# Patient Record
Sex: Male | Born: 1955 | Race: White | Hispanic: No | Marital: Married | Smoking: Former smoker
Health system: Southern US, Community
[De-identification: ages and names within clinical notes are randomized; demographics above are authoritative.]

## PROBLEM LIST (undated history)

## (undated) DIAGNOSIS — F32A Depression, unspecified: Secondary | ICD-10-CM

## (undated) DIAGNOSIS — I739 Peripheral vascular disease, unspecified: Secondary | ICD-10-CM

## (undated) DIAGNOSIS — E785 Hyperlipidemia, unspecified: Secondary | ICD-10-CM

## (undated) DIAGNOSIS — E669 Obesity, unspecified: Secondary | ICD-10-CM

## (undated) DIAGNOSIS — I10 Essential (primary) hypertension: Secondary | ICD-10-CM

## (undated) DIAGNOSIS — F329 Major depressive disorder, single episode, unspecified: Secondary | ICD-10-CM

## (undated) DIAGNOSIS — F17200 Nicotine dependence, unspecified, uncomplicated: Secondary | ICD-10-CM

## (undated) DIAGNOSIS — R5382 Chronic fatigue, unspecified: Secondary | ICD-10-CM

## (undated) DIAGNOSIS — F419 Anxiety disorder, unspecified: Secondary | ICD-10-CM

## (undated) DIAGNOSIS — E119 Type 2 diabetes mellitus without complications: Secondary | ICD-10-CM

## (undated) DIAGNOSIS — N529 Male erectile dysfunction, unspecified: Secondary | ICD-10-CM

## (undated) HISTORY — DX: Nicotine dependence, unspecified, uncomplicated: F17.200

## (undated) HISTORY — DX: Hyperlipidemia, unspecified: E78.5

## (undated) HISTORY — DX: Essential (primary) hypertension: I10

## (undated) HISTORY — DX: Obesity, unspecified: E66.9

## (undated) HISTORY — PX: CHOLECYSTECTOMY: SHX55

## (undated) HISTORY — DX: Chronic fatigue, unspecified: R53.82

## (undated) HISTORY — DX: Anxiety disorder, unspecified: F41.9

## (undated) HISTORY — PX: OTHER SURGICAL HISTORY: SHX169

## (undated) HISTORY — DX: Depression, unspecified: F32.A

## (undated) HISTORY — DX: Type 2 diabetes mellitus without complications: E11.9

## (undated) HISTORY — DX: Peripheral vascular disease, unspecified: I73.9

## (undated) HISTORY — DX: Male erectile dysfunction, unspecified: N52.9

## (undated) HISTORY — DX: Major depressive disorder, single episode, unspecified: F32.9

---

## 2001-10-23 ENCOUNTER — Encounter: Payer: Self-pay | Admitting: Family Medicine

## 2001-10-23 ENCOUNTER — Ambulatory Visit (HOSPITAL_COMMUNITY): Admission: RE | Admit: 2001-10-23 | Discharge: 2001-10-23 | Payer: Self-pay | Admitting: Family Medicine

## 2001-10-23 ENCOUNTER — Ambulatory Visit: Admission: RE | Admit: 2001-10-23 | Discharge: 2001-10-23 | Payer: Self-pay | Admitting: Family Medicine

## 2002-06-13 ENCOUNTER — Ambulatory Visit (HOSPITAL_COMMUNITY): Admission: RE | Admit: 2002-06-13 | Discharge: 2002-06-13 | Payer: Self-pay | Admitting: Family Medicine

## 2002-07-11 ENCOUNTER — Ambulatory Visit (HOSPITAL_COMMUNITY): Admission: RE | Admit: 2002-07-11 | Discharge: 2002-07-11 | Payer: Self-pay | Admitting: Family Medicine

## 2002-07-11 ENCOUNTER — Encounter: Payer: Self-pay | Admitting: Family Medicine

## 2002-08-01 ENCOUNTER — Observation Stay (HOSPITAL_COMMUNITY): Admission: RE | Admit: 2002-08-01 | Discharge: 2002-08-02 | Payer: Self-pay | Admitting: General Surgery

## 2002-09-24 ENCOUNTER — Ambulatory Visit (HOSPITAL_COMMUNITY): Admission: RE | Admit: 2002-09-24 | Discharge: 2002-09-24 | Payer: Self-pay | Admitting: General Surgery

## 2002-09-24 HISTORY — PX: ESOPHAGOGASTRODUODENOSCOPY: SHX1529

## 2002-10-01 ENCOUNTER — Ambulatory Visit (HOSPITAL_COMMUNITY): Admission: RE | Admit: 2002-10-01 | Discharge: 2002-10-01 | Payer: Self-pay | Admitting: General Surgery

## 2002-10-01 HISTORY — PX: COLONOSCOPY: SHX174

## 2002-10-01 LAB — HM COLONOSCOPY: HM Colonoscopy: NORMAL

## 2004-04-27 ENCOUNTER — Ambulatory Visit (HOSPITAL_COMMUNITY): Admission: RE | Admit: 2004-04-27 | Discharge: 2004-04-27 | Payer: Self-pay | Admitting: Family Medicine

## 2004-08-25 ENCOUNTER — Ambulatory Visit (HOSPITAL_COMMUNITY): Admission: RE | Admit: 2004-08-25 | Discharge: 2004-08-25 | Payer: Self-pay | Admitting: Orthopedic Surgery

## 2004-09-26 ENCOUNTER — Ambulatory Visit (HOSPITAL_COMMUNITY): Admission: RE | Admit: 2004-09-26 | Discharge: 2004-09-26 | Payer: Self-pay | Admitting: Family Medicine

## 2004-09-30 ENCOUNTER — Ambulatory Visit: Payer: Self-pay | Admitting: Internal Medicine

## 2004-10-06 ENCOUNTER — Inpatient Hospital Stay (HOSPITAL_BASED_OUTPATIENT_CLINIC_OR_DEPARTMENT_OTHER): Admission: RE | Admit: 2004-10-06 | Discharge: 2004-10-06 | Payer: Self-pay | Admitting: Cardiology

## 2004-10-06 ENCOUNTER — Ambulatory Visit: Payer: Self-pay | Admitting: Cardiology

## 2004-10-07 ENCOUNTER — Ambulatory Visit (HOSPITAL_COMMUNITY): Admission: RE | Admit: 2004-10-07 | Discharge: 2004-10-07 | Payer: Self-pay | Admitting: Cardiology

## 2004-11-02 ENCOUNTER — Ambulatory Visit: Payer: Self-pay

## 2004-11-02 ENCOUNTER — Ambulatory Visit: Payer: Self-pay | Admitting: Cardiovascular Disease

## 2004-11-21 ENCOUNTER — Ambulatory Visit: Payer: Self-pay | Admitting: Family Medicine

## 2004-12-22 ENCOUNTER — Ambulatory Visit: Payer: Self-pay | Admitting: Family Medicine

## 2005-01-23 ENCOUNTER — Ambulatory Visit: Payer: Self-pay | Admitting: Family Medicine

## 2005-02-20 ENCOUNTER — Ambulatory Visit: Payer: Self-pay | Admitting: Family Medicine

## 2005-03-02 ENCOUNTER — Ambulatory Visit: Payer: Self-pay | Admitting: Orthopedic Surgery

## 2005-04-25 ENCOUNTER — Ambulatory Visit: Payer: Self-pay | Admitting: Family Medicine

## 2005-04-27 ENCOUNTER — Ambulatory Visit (HOSPITAL_COMMUNITY): Admission: RE | Admit: 2005-04-27 | Discharge: 2005-04-27 | Payer: Self-pay | Admitting: Family Medicine

## 2005-06-22 ENCOUNTER — Ambulatory Visit: Payer: Self-pay | Admitting: Family Medicine

## 2005-08-23 ENCOUNTER — Ambulatory Visit: Payer: Self-pay | Admitting: Family Medicine

## 2005-09-21 ENCOUNTER — Ambulatory Visit: Payer: Self-pay | Admitting: Family Medicine

## 2005-11-27 ENCOUNTER — Ambulatory Visit: Payer: Self-pay | Admitting: Family Medicine

## 2005-12-07 ENCOUNTER — Ambulatory Visit: Payer: Self-pay | Admitting: Family Medicine

## 2006-01-08 ENCOUNTER — Ambulatory Visit: Payer: Self-pay | Admitting: Family Medicine

## 2006-03-07 ENCOUNTER — Ambulatory Visit: Payer: Self-pay | Admitting: Family Medicine

## 2006-04-17 ENCOUNTER — Ambulatory Visit: Payer: Self-pay | Admitting: Family Medicine

## 2006-05-24 ENCOUNTER — Ambulatory Visit: Payer: Self-pay | Admitting: Family Medicine

## 2006-05-29 ENCOUNTER — Ambulatory Visit (HOSPITAL_COMMUNITY): Admission: RE | Admit: 2006-05-29 | Discharge: 2006-05-29 | Payer: Self-pay | Admitting: Family Medicine

## 2006-07-05 ENCOUNTER — Ambulatory Visit: Payer: Self-pay | Admitting: Family Medicine

## 2006-08-16 ENCOUNTER — Ambulatory Visit: Payer: Self-pay | Admitting: Family Medicine

## 2006-10-15 ENCOUNTER — Ambulatory Visit: Payer: Self-pay | Admitting: Family Medicine

## 2006-11-22 ENCOUNTER — Ambulatory Visit: Payer: Self-pay | Admitting: Family Medicine

## 2007-01-23 ENCOUNTER — Ambulatory Visit: Payer: Self-pay | Admitting: Family Medicine

## 2007-01-24 ENCOUNTER — Encounter: Payer: Self-pay | Admitting: Family Medicine

## 2007-03-14 ENCOUNTER — Ambulatory Visit: Payer: Self-pay | Admitting: Family Medicine

## 2007-03-14 LAB — CONVERTED CEMR LAB
AST: 16 units/L (ref 0–37)
Albumin: 4.4 g/dL (ref 3.5–5.2)
Basophils Relative: 1 % (ref 0–1)
Bilirubin, Direct: 0.1 mg/dL (ref 0.0–0.3)
Calcium: 9.6 mg/dL (ref 8.4–10.5)
Chloride: 108 meq/L (ref 96–112)
Cholesterol: 215 mg/dL — ABNORMAL HIGH (ref 0–200)
Eosinophils Absolute: 0.2 10*3/uL (ref 0.0–0.7)
Eosinophils Relative: 2 % (ref 0–5)
Glucose, Bld: 108 mg/dL — ABNORMAL HIGH (ref 70–99)
HCT: 44.3 % (ref 39.0–52.0)
Hemoglobin: 14.7 g/dL (ref 13.0–17.0)
Indirect Bilirubin: 0.4 mg/dL (ref 0.0–0.9)
LDL Cholesterol: 152 mg/dL — ABNORMAL HIGH (ref 0–99)
Lymphs Abs: 2.6 10*3/uL (ref 0.7–3.3)
MCV: 96.5 fL (ref 78.0–100.0)
Platelets: 264 10*3/uL (ref 150–400)
Potassium: 4.9 meq/L (ref 3.5–5.3)
Total Bilirubin: 0.5 mg/dL (ref 0.3–1.2)
Total CHOL/HDL Ratio: 6.5
Triglycerides: 150 mg/dL — ABNORMAL HIGH (ref <150)

## 2007-04-04 ENCOUNTER — Ambulatory Visit (HOSPITAL_COMMUNITY): Admission: RE | Admit: 2007-04-04 | Discharge: 2007-04-04 | Payer: Self-pay | Admitting: Family Medicine

## 2007-04-25 ENCOUNTER — Ambulatory Visit (HOSPITAL_COMMUNITY): Admission: RE | Admit: 2007-04-25 | Discharge: 2007-04-25 | Payer: Self-pay | Admitting: Family Medicine

## 2007-04-25 ENCOUNTER — Ambulatory Visit: Payer: Self-pay | Admitting: Family Medicine

## 2007-05-17 ENCOUNTER — Ambulatory Visit: Payer: Self-pay | Admitting: Family Medicine

## 2007-06-26 ENCOUNTER — Ambulatory Visit: Payer: Self-pay | Admitting: Family Medicine

## 2007-06-26 LAB — CONVERTED CEMR LAB
Cholesterol: 260 mg/dL — ABNORMAL HIGH (ref 0–200)
Eosinophils Absolute: 0.2 10*3/uL (ref 0.0–0.7)
HCT: 45.3 % (ref 39.0–52.0)
Hgb A1c MFr Bld: 6 % (ref 4.6–6.1)
Lymphocytes Relative: 28 % (ref 12–46)
MCV: 97.6 fL (ref 78.0–100.0)
Monocytes Absolute: 0.5 10*3/uL (ref 0.2–0.7)
Neutro Abs: 5.6 10*3/uL (ref 1.7–7.7)
Neutrophils Relative %: 64 % (ref 43–77)
RDW: 13.3 % (ref 11.5–14.0)
Total CHOL/HDL Ratio: 7.2
WBC: 8.7 10*3/uL (ref 4.0–10.5)

## 2007-09-03 ENCOUNTER — Ambulatory Visit: Payer: Self-pay | Admitting: Family Medicine

## 2007-11-27 ENCOUNTER — Ambulatory Visit: Payer: Self-pay | Admitting: Family Medicine

## 2007-12-18 ENCOUNTER — Encounter: Payer: Self-pay | Admitting: Family Medicine

## 2007-12-18 LAB — CONVERTED CEMR LAB
ALT: 25 units/L (ref 0–53)
CO2: 27 meq/L (ref 19–32)
Calcium: 9.8 mg/dL (ref 8.4–10.5)
Glucose, Bld: 118 mg/dL — ABNORMAL HIGH (ref 70–99)
Potassium: 5 meq/L (ref 3.5–5.3)
Sodium: 142 meq/L (ref 135–145)
Total CHOL/HDL Ratio: 6
Triglycerides: 112 mg/dL (ref <150)

## 2007-12-19 ENCOUNTER — Encounter: Payer: Self-pay | Admitting: Family Medicine

## 2008-01-22 ENCOUNTER — Ambulatory Visit: Payer: Self-pay | Admitting: Family Medicine

## 2008-02-11 ENCOUNTER — Ambulatory Visit (HOSPITAL_COMMUNITY): Admission: RE | Admit: 2008-02-11 | Discharge: 2008-02-11 | Payer: Self-pay | Admitting: Family Medicine

## 2008-03-04 ENCOUNTER — Ambulatory Visit: Payer: Self-pay | Admitting: Family Medicine

## 2008-03-04 LAB — CONVERTED CEMR LAB: Microalb, Ur: 0.62 mg/dL (ref 0.00–1.89)

## 2008-03-18 HISTORY — PX: OTHER SURGICAL HISTORY: SHX169

## 2008-04-09 ENCOUNTER — Ambulatory Visit (HOSPITAL_COMMUNITY): Admission: RE | Admit: 2008-04-09 | Discharge: 2008-04-09 | Payer: Self-pay | Admitting: Orthopaedic Surgery

## 2008-05-22 DIAGNOSIS — R5381 Other malaise: Secondary | ICD-10-CM

## 2008-05-22 DIAGNOSIS — I10 Essential (primary) hypertension: Secondary | ICD-10-CM | POA: Insufficient documentation

## 2008-05-22 DIAGNOSIS — E1165 Type 2 diabetes mellitus with hyperglycemia: Secondary | ICD-10-CM

## 2008-05-22 DIAGNOSIS — F329 Major depressive disorder, single episode, unspecified: Secondary | ICD-10-CM | POA: Insufficient documentation

## 2008-05-22 DIAGNOSIS — E1159 Type 2 diabetes mellitus with other circulatory complications: Secondary | ICD-10-CM | POA: Insufficient documentation

## 2008-05-22 DIAGNOSIS — R5383 Other fatigue: Secondary | ICD-10-CM

## 2008-05-22 DIAGNOSIS — F411 Generalized anxiety disorder: Secondary | ICD-10-CM | POA: Insufficient documentation

## 2008-05-22 DIAGNOSIS — I739 Peripheral vascular disease, unspecified: Secondary | ICD-10-CM

## 2008-05-22 DIAGNOSIS — Z794 Long term (current) use of insulin: Secondary | ICD-10-CM

## 2008-05-22 DIAGNOSIS — E785 Hyperlipidemia, unspecified: Secondary | ICD-10-CM | POA: Insufficient documentation

## 2008-05-22 DIAGNOSIS — F17200 Nicotine dependence, unspecified, uncomplicated: Secondary | ICD-10-CM | POA: Insufficient documentation

## 2008-05-22 DIAGNOSIS — F528 Other sexual dysfunction not due to a substance or known physiological condition: Secondary | ICD-10-CM

## 2008-06-04 ENCOUNTER — Encounter: Payer: Self-pay | Admitting: Family Medicine

## 2008-07-02 ENCOUNTER — Ambulatory Visit: Payer: Self-pay | Admitting: Family Medicine

## 2008-07-28 ENCOUNTER — Telehealth: Payer: Self-pay | Admitting: Family Medicine

## 2008-07-29 ENCOUNTER — Encounter: Payer: Self-pay | Admitting: Family Medicine

## 2008-08-25 ENCOUNTER — Encounter: Payer: Self-pay | Admitting: Family Medicine

## 2008-08-25 ENCOUNTER — Telehealth: Payer: Self-pay | Admitting: Family Medicine

## 2008-09-01 HISTORY — PX: ROTATOR CUFF REPAIR: SHX139

## 2008-09-04 ENCOUNTER — Encounter: Payer: Self-pay | Admitting: Family Medicine

## 2008-09-16 ENCOUNTER — Ambulatory Visit: Payer: Self-pay | Admitting: Family Medicine

## 2008-09-16 DIAGNOSIS — M25519 Pain in unspecified shoulder: Secondary | ICD-10-CM

## 2008-09-24 ENCOUNTER — Encounter: Payer: Self-pay | Admitting: Family Medicine

## 2008-10-20 ENCOUNTER — Encounter: Payer: Self-pay | Admitting: Family Medicine

## 2008-10-20 ENCOUNTER — Telehealth: Payer: Self-pay | Admitting: Family Medicine

## 2008-10-21 ENCOUNTER — Encounter: Payer: Self-pay | Admitting: Family Medicine

## 2008-10-27 ENCOUNTER — Encounter: Payer: Self-pay | Admitting: Family Medicine

## 2008-11-26 ENCOUNTER — Telehealth: Payer: Self-pay | Admitting: Family Medicine

## 2008-11-30 ENCOUNTER — Encounter: Payer: Self-pay | Admitting: Family Medicine

## 2008-12-25 ENCOUNTER — Ambulatory Visit: Payer: Self-pay | Admitting: Family Medicine

## 2008-12-25 DIAGNOSIS — R413 Other amnesia: Secondary | ICD-10-CM | POA: Insufficient documentation

## 2008-12-25 DIAGNOSIS — M5432 Sciatica, left side: Secondary | ICD-10-CM

## 2008-12-25 LAB — CONVERTED CEMR LAB
Glucose, Bld: 126 mg/dL
Hgb A1c MFr Bld: 6.3 %

## 2009-01-01 ENCOUNTER — Encounter: Payer: Self-pay | Admitting: Family Medicine

## 2009-01-13 ENCOUNTER — Telehealth: Payer: Self-pay | Admitting: Family Medicine

## 2009-02-01 ENCOUNTER — Encounter: Payer: Self-pay | Admitting: Family Medicine

## 2009-03-01 ENCOUNTER — Telehealth: Payer: Self-pay | Admitting: Family Medicine

## 2009-03-01 ENCOUNTER — Encounter: Payer: Self-pay | Admitting: Family Medicine

## 2009-03-02 ENCOUNTER — Encounter: Payer: Self-pay | Admitting: Family Medicine

## 2009-03-26 ENCOUNTER — Encounter: Payer: Self-pay | Admitting: Family Medicine

## 2009-04-05 ENCOUNTER — Telehealth: Payer: Self-pay | Admitting: Family Medicine

## 2009-04-05 ENCOUNTER — Encounter: Payer: Self-pay | Admitting: Family Medicine

## 2009-05-04 ENCOUNTER — Telehealth: Payer: Self-pay | Admitting: Family Medicine

## 2009-05-06 ENCOUNTER — Encounter: Payer: Self-pay | Admitting: Family Medicine

## 2009-05-24 ENCOUNTER — Ambulatory Visit: Payer: Self-pay | Admitting: Family Medicine

## 2009-05-24 DIAGNOSIS — M79609 Pain in unspecified limb: Secondary | ICD-10-CM

## 2009-05-24 LAB — CONVERTED CEMR LAB
Glucose, Bld: 187 mg/dL
Hgb A1c MFr Bld: 6.2 %

## 2009-06-01 ENCOUNTER — Encounter: Payer: Self-pay | Admitting: Family Medicine

## 2009-06-07 ENCOUNTER — Telehealth: Payer: Self-pay | Admitting: Family Medicine

## 2009-06-30 ENCOUNTER — Telehealth: Payer: Self-pay | Admitting: Family Medicine

## 2009-07-01 ENCOUNTER — Encounter: Payer: Self-pay | Admitting: Family Medicine

## 2009-07-14 ENCOUNTER — Ambulatory Visit: Payer: Self-pay | Admitting: Family Medicine

## 2009-07-14 DIAGNOSIS — E669 Obesity, unspecified: Secondary | ICD-10-CM | POA: Insufficient documentation

## 2009-07-14 LAB — CONVERTED CEMR LAB: Glucose, Bld: 109 mg/dL

## 2009-07-15 ENCOUNTER — Encounter: Payer: Self-pay | Admitting: Family Medicine

## 2009-07-15 ENCOUNTER — Telehealth: Payer: Self-pay | Admitting: Family Medicine

## 2009-07-15 LAB — CONVERTED CEMR LAB: Microalb Creat Ratio: 7.6 mg/g (ref 0.0–30.0)

## 2009-07-16 ENCOUNTER — Telehealth: Payer: Self-pay | Admitting: Family Medicine

## 2009-07-19 ENCOUNTER — Encounter: Payer: Self-pay | Admitting: Family Medicine

## 2009-07-21 ENCOUNTER — Encounter: Payer: Self-pay | Admitting: Family Medicine

## 2009-07-23 ENCOUNTER — Encounter: Payer: Self-pay | Admitting: Family Medicine

## 2009-07-23 LAB — CONVERTED CEMR LAB
ALT: 25 units/L (ref 0–53)
Albumin: 4.7 g/dL (ref 3.5–5.2)
Basophils Absolute: 0 10*3/uL (ref 0.0–0.1)
Basophils Relative: 0 % (ref 0–1)
Calcium: 9.9 mg/dL (ref 8.4–10.5)
Chloride: 103 meq/L (ref 96–112)
Eosinophils Absolute: 0.2 10*3/uL (ref 0.0–0.7)
Eosinophils Relative: 2 % (ref 0–5)
HCT: 46.3 % (ref 39.0–52.0)
HDL: 39 mg/dL — ABNORMAL LOW (ref >39)
Hemoglobin: 15.5 g/dL (ref 13.0–17.0)
MCV: 93.9 fL (ref 78.0–100.0)
Monocytes Relative: 9 % (ref 3–12)
Neutrophils Relative %: 61 % (ref 43–77)
Platelets: 267 10*3/uL (ref 150–400)
RDW: 13.3 % (ref 11.5–15.5)
Sodium: 137 meq/L (ref 135–145)
TSH: 2.475 microintl units/mL (ref 0.350–4.500)
Total Protein: 7.3 g/dL (ref 6.0–8.3)
Uric Acid, Serum: 6.5 mg/dL (ref 4.0–7.8)

## 2009-07-28 ENCOUNTER — Encounter: Payer: Self-pay | Admitting: Family Medicine

## 2009-10-29 ENCOUNTER — Ambulatory Visit: Payer: Self-pay | Admitting: Family Medicine

## 2009-10-29 LAB — CONVERTED CEMR LAB: Glucose, Bld: 187 mg/dL

## 2010-01-13 ENCOUNTER — Ambulatory Visit: Payer: Self-pay | Admitting: Family Medicine

## 2010-01-13 DIAGNOSIS — J209 Acute bronchitis, unspecified: Secondary | ICD-10-CM

## 2010-02-15 ENCOUNTER — Telehealth: Payer: Self-pay | Admitting: Family Medicine

## 2010-04-07 ENCOUNTER — Encounter: Payer: Self-pay | Admitting: Family Medicine

## 2010-04-14 ENCOUNTER — Telehealth: Payer: Self-pay | Admitting: Family Medicine

## 2010-04-21 ENCOUNTER — Telehealth: Payer: Self-pay | Admitting: Family Medicine

## 2010-06-21 ENCOUNTER — Ambulatory Visit: Payer: Self-pay | Admitting: Family Medicine

## 2010-06-23 LAB — CONVERTED CEMR LAB
ALT: 24 units/L (ref 0–53)
Albumin: 4.5 g/dL (ref 3.5–5.2)
Alkaline Phosphatase: 59 units/L (ref 39–117)
BUN: 10 mg/dL (ref 6–23)
CO2: 25 meq/L (ref 19–32)
Calcium: 10 mg/dL (ref 8.4–10.5)
Chloride: 104 meq/L (ref 96–112)
Cholesterol: 263 mg/dL — ABNORMAL HIGH (ref 0–200)
Glucose, Bld: 82 mg/dL (ref 70–99)
HDL: 40 mg/dL (ref >39)
Indirect Bilirubin: 0.3 mg/dL (ref 0.0–0.9)
Sodium: 140 meq/L (ref 135–145)
Total CHOL/HDL Ratio: 6.6
Total Protein: 7 g/dL (ref 6.0–8.3)
Triglycerides: 281 mg/dL — ABNORMAL HIGH (ref <150)
VLDL: 56 mg/dL — ABNORMAL HIGH (ref 0–40)

## 2010-06-24 ENCOUNTER — Telehealth: Payer: Self-pay | Admitting: Family Medicine

## 2010-08-02 ENCOUNTER — Telehealth: Payer: Self-pay | Admitting: Family Medicine

## 2010-09-21 ENCOUNTER — Ambulatory Visit: Payer: Self-pay | Admitting: Family Medicine

## 2010-09-21 DIAGNOSIS — H547 Unspecified visual loss: Secondary | ICD-10-CM

## 2010-09-22 LAB — CONVERTED CEMR LAB
AST: 15 units/L (ref 0–37)
Alkaline Phosphatase: 56 units/L (ref 39–117)
Basophils Absolute: 0.1 10*3/uL (ref 0.0–0.1)
Basophils Relative: 1 % (ref 0–1)
CO2: 26 meq/L (ref 19–32)
Chloride: 104 meq/L (ref 96–112)
Creatinine, Ser: 0.8 mg/dL (ref 0.40–1.50)
Glucose, Bld: 83 mg/dL (ref 70–99)
HCT: 42.6 % (ref 39.0–52.0)
Hemoglobin: 14.6 g/dL (ref 13.0–17.0)
Hgb A1c MFr Bld: 6.2 % — ABNORMAL HIGH (ref <5.7)
MCHC: 34.3 g/dL (ref 30.0–36.0)
Monocytes Absolute: 0.8 10*3/uL (ref 0.1–1.0)
Monocytes Relative: 8 % (ref 3–12)
Neutro Abs: 4.5 10*3/uL (ref 1.7–7.7)
PSA: 1.79 ng/mL (ref 0.10–4.00)
Platelets: 248 10*3/uL (ref 150–400)
RDW: 12.8 % (ref 11.5–15.5)
Total Protein: 7 g/dL (ref 6.0–8.3)
Vit D, 25-Hydroxy: 47 ng/mL (ref 30–89)
WBC: 9.1 10*3/uL (ref 4.0–10.5)

## 2010-09-25 DIAGNOSIS — J019 Acute sinusitis, unspecified: Secondary | ICD-10-CM | POA: Insufficient documentation

## 2010-12-21 ENCOUNTER — Telehealth: Payer: Self-pay | Admitting: Family Medicine

## 2011-01-05 ENCOUNTER — Ambulatory Visit: Admit: 2011-01-05 | Payer: Self-pay | Admitting: Family Medicine

## 2011-01-05 ENCOUNTER — Encounter: Payer: Self-pay | Admitting: Family Medicine

## 2011-01-08 ENCOUNTER — Encounter: Payer: Self-pay | Admitting: Orthopedic Surgery

## 2011-01-08 ENCOUNTER — Encounter: Payer: Self-pay | Admitting: Family Medicine

## 2011-01-17 NOTE — Letter (Signed)
Summary: misc  misc   Imported By: Curtis Sites 05/23/2010 13:20:45  _____________________________________________________________________  External Attachment:    Type:   Image     Comment:   External Document

## 2011-01-17 NOTE — Letter (Signed)
Summary: physical and history  physical and history   Imported By: Curtis Sites 05/23/2010 13:10:06  _____________________________________________________________________  External Attachment:    Type:   Image     Comment:   External Document

## 2011-01-17 NOTE — Assessment & Plan Note (Signed)
Summary: OV   Vital Signs:  Patient profile:   55 year old male Height:      69 inches Weight:      200 pounds BMI:     29.64 O2 Sat:      99 % on Room air Temp:     99.2 degrees F oral Pulse rate:   76 / minute Pulse rhythm:   regular Resp:     16 per minute BP sitting:   150 / 78 Cuff size:   large  Vitals Entered By: Everitt Amber (January 13, 2010 1:49 PM)  Nutrition Counseling: Patient's BMI is greater than 25 and therefore counseled on weight management options.  O2 Flow:  Room air CC: Headache, nausea, upset stomach, coughing with green phlegm, really weak, chest is sore, chills, fever and body aches   CC:  Headache, nausea, upset stomach, coughing with green phlegm, really weak, chest is sore, chills, and fever and body aches.  History of Present Illness: Pt c/o 1 week h/o increeasing weakness, with chills, fatigue, headache, cough  sputum , fever and chills.  Preventive Screening-Counseling & Management  Alcohol-Tobacco     Smoking Cessation Counseling: yes  Allergies: 1)  ! Septra 2)  ! Levaquin  Review of Systems      See HPI General:  Complains of chills, fatigue, fever, loss of appetite, malaise, sweats, and weakness. Eyes:  Denies discharge and red eye. ENT:  Complains of earache, postnasal drainage, sinus pressure, and sore throat. CV:  Denies chest pain or discomfort, palpitations, and swelling of feet. Resp:  Complains of cough, shortness of breath, sputum productive, and wheezing; 1 week h/o progressive symptoms. GI:  Complains of diarrhea and nausea. GU:  Denies dysuria and urinary frequency. MS:  Complains of joint pain, low back pain, and mid back pain. Endo:  Denies cold intolerance, excessive hunger, excessive thirst, excessive urination, heat intolerance, polyuria, and weight change; tests infrequentlly. Heme:  Denies abnormal bruising and bleeding. Allergy:  Complains of seasonal allergies; denies hives or rash and sneezing.  Physical  Exam  General:  Well-developed,well-nourished,in no acute distress; alert,appropriate and cooperative throughout examination. Ill appearing HEENT: No facial asymmetry,  EOMI,positiveo sinus tenderness, TM's Clear, oropharynx  pink and moist.   Chest: bilateral crackles and wheezes withreduced air entry throughout  CVS: S1, S2, No murmurs, No S3.   Abd: Soft, Nontender.  MS: decreased  ROM spine, hips, shoulders and knees.  Ext: No edema.   CNS: CN 2-12 intact, power tone and sensation normal throughout.   Skin: Intact, no visible lesions or rashes.  Psych: Good eye contact, normal affect.  Memory intact, not anxious or depressed appearing.    Impression & Recommendations:  Problem # 1:  OTHER ACUTE SINUSITIS (ICD-461.8) Assessment Comment Only  His updated medication list for this problem includes:    Penicillin V Potassium 500 Mg Tabs (Penicillin v potassium) .Marland Kitchen... Take 1 tablet by mouth three times a day    Tessalon Perles 100 Mg Caps (Benzonatate) .Marland Kitchen... Take 1 capsule by mouth three times a day  Orders: Admin of Therapeutic Inj  intramuscular or subcutaneous (54098)  Problem # 2:  ACUTE BRONCHITIS (ICD-466.0) Assessment: Comment Only  His updated medication list for this problem includes:    Penicillin V Potassium 500 Mg Tabs (Penicillin v potassium) .Marland Kitchen... Take 1 tablet by mouth three times a day    Tessalon Perles 100 Mg Caps (Benzonatate) .Marland Kitchen... Take 1 capsule by mouth three times a day  Orders:  Rocephin  250mg  (Z6109) Albuterol Sulfate Sol 1mg  unit dose (U0454) Atrovent 1mg  (Neb) (U9811) Nebulizer Tx (91478)  Problem # 3:  DEPRESSION (ICD-311) Assessment: Improved  His updated medication list for this problem includes:    Diazepam 10 Mg Tabs (Diazepam) ..... One to two tabs by mouth two times a day prn    Cymbalta 60 Mg Cpep (Duloxetine hcl) ..... One cap by mouth qd  Problem # 4:  NICOTINE ADDICTION (ICD-305.1) Assessment: Unchanged  Encouraged smoking  cessation and discussed different methods for smoking cessation.   Problem # 5:  DIABETES MELLITUS, TYPE II (ICD-250.00) Assessment: Comment Only  His updated medication list for this problem includes:    Metformin Hcl 500 Mg Tabs (Metformin hcl) ..... One tab by mouth bid    Lotensin Hct 20-25 Mg Tabs (Benazepril-hydrochlorothiazide) .Marland Kitchen... Take 1 tablet by mouth once a day  Labs Reviewed: Creat: 0.99 (07/21/2009)    Reviewed HgBA1c results: 6.2 (10/29/2009)  6.2 (05/24/2009)  Problem # 6:  HYPERTENSION (ICD-401.9) Assessment: Deteriorated  His updated medication list for this problem includes:    Lotensin Hct 20-25 Mg Tabs (Benazepril-hydrochlorothiazide) .Marland Kitchen... Take 1 tablet by mouth once a day    Norvasc 5 Mg Tabs (Amlodipine besylate) .Marland Kitchen... Take 1 tablet by mouth once a day  Orders: T-Basic Metabolic Panel ([id number])  BP today: 150/78 Prior BP: 140/76 (10/29/2009)  Labs Reviewed: K+: 4.7 (07/21/2009) Creat: : 0.99 (07/21/2009)   Chol: 215 (07/21/2009)   HDL: 39 (07/21/2009)   LDL: 141 (07/21/2009)   TG: 175 (07/21/2009)  Problem # 7:  HYPERLIPIDEMIA (ICD-272.4) Assessment: Comment Only  His updated medication list for this problem includes:    Crestor 20 Mg Tabs (Rosuvastatin calcium) .Marland Kitchen... ,take 1 tab by mouth at bedtime  Orders: T-Hepatic Function [phone]) T-Lipid Profile [phone])  Labs Reviewed: SGOT: 13 (07/21/2009)   SGPT: 25 (07/21/2009)   HDL:39 (07/21/2009), 44 (12/18/2007)  LDL:141 (07/21/2009), 198 (12/18/2007)  Chol:215 (07/21/2009), 264 (12/18/2007)  Trig:175 (07/21/2009), 112 (12/18/2007)  Problem # 8:  INFLUENZA (ICD-487.8) Assessment: Comment Only tamiflu prescribed  Problem # 9:  BACK PAIN WITH RADICULOPATHY (ICD-729.2) Assessment: Deteriorated  Orders: Depo- Medrol 80mg  (J1040) Ketorolac-Toradol 15mg  (U2725)  Complete Medication List: 1)  Adderall 10 Mg Tabs (Amphetamine-dextroamphetamine) .... One tab by mouth two times a  day 2)  Levitra 20 Mg Tabs (Vardenafil hcl) .... One tab by mouth once daily as needed 3)  Lotrisone 1-0.05 % Lotn (Clotrimazole-betamethasone) .... Uad 4)  Metformin Hcl 500 Mg Tabs (Metformin hcl) .... One tab by mouth bid 5)  Crestor 20 Mg Tabs (Rosuvastatin calcium) .... ,take 1 tab by mouth at bedtime 6)  Penicillin V Potassium 500 Mg Tabs (Penicillin v potassium) .... Take 1 tablet by mouth three times a day 7)  Tessalon Perles 100 Mg Caps (Benzonatate) .... Take 1 capsule by mouth three times a day 8)  Lotensin Hct 20-25 Mg Tabs (Benazepril-hydrochlorothiazide) .... Take 1 tablet by mouth once a day 9)  Fentanyl 75 Mcg/hr Pt72 (Fentanyl) .... One patch every two days 10)  Promethazine Hcl 25 Mg Tabs (Promethazine hcl) .... One to two tabs every 6 hours prn 11)  Diazepam 10 Mg Tabs (Diazepam) .... One to two tabs by mouth two times a day prn 12)  Cymbalta 60 Mg Cpep (Duloxetine hcl) .... One cap by mouth qd 13)  Nabumetone 750 Mg Tabs (Nabumetone) .... One tab by mouth bid 14)  Proctocream-hc 2.5 % Crea (Hydrocortisone) .... Apply two times a day prn 15)  Norvasc 5 Mg Tabs (Amlodipine besylate) .... Take 1 tablet by mouth once a day 16)  Tamiflu 75 Mg Caps (Oseltamivir phosphate) .... Take 1 capsule by mouth two times a day 17)  Penicillin V Potassium 500 Mg Tabs (Penicillin v potassium) .... Take 1 tablet by mouth three times a day 18)  Tessalon Perles 100 Mg Caps (Benzonatate) .... Take 1 capsule by mouth three times a day 19)  Apothecary Cough Syrup  .... 5 cc by mouth every 6 hrs as needed  Patient Instructions: 1)  F/U in  2 months. 2)  You are being treated for influenza, sinusitis, and acute bronchitis. 3)  Tobacco is very bad for your health and your loved ones! You Should stop smoking!. 4)  Stop Smoking Tips: Choose a Quit date. Cut down before the Quit date. decide what you will do as a substitute when you feel the urge to smoke(gum,toothpick,exercise). 5)  It is important  that you exercise regularly at least 20 minutes 5 times a week. If you develop chest pain, have severe difficulty breathing, or feel very tired , stop exercising immediately and seek medical attention. 6)  You need to lose weight. Consider a lower calorie diet and regular exercise.  7)  BMP prior to visit, ICD-9: 8)  Hepatic Panel prior to visit, ICD-9:   fasting in 2 months 9)  Lipid Panel prior to visit, ICD-9: Prescriptions: APOTHECARY COUGH SYRUP 5 cc by mouth every 6 hrs as needed  #4oz x 1   Entered and Authorized by:   Syliva Overman MD   Signed by:   Syliva Overman MD on 01/24/2010   Method used:   Handwritten   RxID:   [id number] TESSALON PERLES 100 MG CAPS (BENZONATATE) Take 1 capsule by mouth three times a day  #30 x 0   Entered and Authorized by:   Syliva Overman MD   Signed by:   Syliva Overman MD on 01/13/2010   Method used:   Electronically to        [facility]* (retail)       [address]       [place], [place]  [address]       Ph: [phone]       Fax: [phone]   RxID:   [id number] PENICILLIN V POTASSIUM 500 MG TABS (PENICILLIN V POTASSIUM) Take 1 tablet by mouth three times a day  #30 x 0   Entered and Authorized by:   Syliva Overman MD   Signed by:   Syliva Overman MD on 01/13/2010   Method used:   Electronically to        [facility]* (retail)       [address]       [place], [place]  [address]       Ph: [phone]       Fax: [phone]   RxID:   [id number] TAMIFLU 75 MG CAPS (OSELTAMIVIR PHOSPHATE) Take 1 capsule by mouth two times a day  #10 x 0   Entered and Authorized by:   Syliva Overman MD   Signed by:   Syliva Overman MD on 01/13/2010   Method used:   Electronically to        [facility]* (retail)       [address]       [place], [place]  [address]       Ph: [phone]       Fax: [phone]   RxID:   [id number]    Medication  Administration  Injection # 1:    Medication: Depo- Medrol 80mg     Diagnosis: BACK PAIN WITH RADICULOPATHY (ICD-729.2)    Route: IM    Site: RUOQ gluteus    Exp Date: 06/2011    Lot #: [id number]    Comments: 80 mg given     Patient tolerated injection without complications    Given by: Everitt Amber (January 13, 2010 3:12 PM)  Injection # 2:    Medication: Ketorolac-Toradol 15mg     Diagnosis: BACK PAIN WITH RADICULOPATHY (ICD-729.2)    Route: IM    Site: LUOQ gluteus    Exp Date: 07/2011    Lot #: [id number]    Mfr: novaplus    Comments: 60 mg given     Patient tolerated injection without complications    Given by: Everitt Amber (January 13, 2010 3:13 PM)  Injection # 3:    Medication: Rocephin  250mg     Diagnosis: ACUTE BRONCHITIS (ICD-466.0)    Route: IM    Site: RUOQ gluteus    Exp Date: 03/2012    Lot #: [id number]    Mfr: sandoz    Comments: 500 mg given     Patient tolerated injection without complications    Given by: Everitt Amber (January 13, 2010 3:14 PM)  Medication # 1:    Medication: Albuterol Sulfate Sol 1mg  unit dose    Diagnosis: ACUTE BRONCHITIS (ICD-466.0)    Dose: 2.5/73ml    Route: inhaled    Exp Date: 8/11    Lot #: [id number]    Mfr: nephron    Patient tolerated medication without complications    Given by: Everitt Amber (January 13, 2010 3:21 PM)  Medication # 2:    Medication: Atrovent 1mg  (Neb)    Diagnosis: ACUTE BRONCHITIS (ICD-466.0)    Dose: 0.5/2.33ml    Route: intranasal    Exp Date: 6/11    Lot #: [id number]    Mfr: nephron    Patient tolerated medication without complications    Given by: Everitt Amber (January 13, 2010 3:22 PM)  Orders Added: 1)  Est. Patient Level IV [99214] 2)  T-Basic Metabolic Panel [80048-22910] 3)  T-Hepatic Function [80076-22960] 4)  T-Lipid Profile [80061-22930] 5)  Depo- Medrol 80mg  [J1040] 6)  Ketorolac-Toradol 15mg  [J1885] 7)  Rocephin  250mg  [J0696] 8)  Admin of Therapeutic Inj  intramuscular or subcutaneous [96372] 9)   Albuterol Sulfate Sol 1mg  unit dose [J7613] 10)  Atrovent 1mg  (Neb) [Q0347] 11)  Nebulizer Tx [42595]

## 2011-01-17 NOTE — Progress Notes (Signed)
Summary: shot  Phone Note Call from Patient   Summary of Call: pt wants to come in today for just a shot.   [phone] Initial call taken by: Rudene Anda,  August 02, 2010 9:40 AM  Follow-up for Phone Call        wife states patient wants injection advised we do not do joint injections but if he is only wanting pain shot to have patient call and schedule ov Follow-up by: Adella Hare LPN,  August 02, 2010 9:44 AM

## 2011-01-17 NOTE — Letter (Signed)
Summary: progress notes  progress notes   Imported By: Curtis Sites 05/23/2010 13:09:37  _____________________________________________________________________  External Attachment:    Type:   Image     Comment:   External Document

## 2011-01-17 NOTE — Progress Notes (Signed)
Summary: KIDNEY STONES  Phone Note Call from Patient   Summary of Call: WENT TO ED FOR KIDNEY STONE AND THEY SAID THAT HE NEEDED A REFERRASLL TO A NEU CALL BACK AT [phone] Initial call taken by: Lind Guest,  April 14, 2010 1:51 PM  Follow-up for Phone Call        pls ask which ED he went to and send for front sheet verifying the dianosis, ,leave on my chair  Follow-up by: Syliva Overman MD,  April 15, 2010 6:58 AM  Additional Follow-up for Phone Call Additional follow up Details #1::        sent for summary Additional Follow-up by: Everitt Amber LPN,  April 15, 2010 8:09 AM

## 2011-01-17 NOTE — Progress Notes (Signed)
Summary: call  Phone Note Call from Patient   Summary of Call: call him at [phone] asap shelia said Initial call taken by: Lind Guest,  February 15, 2010 1:31 PM  Follow-up for Phone Call        Mark Walls has been suffering with congestion in his nose and throat.  Rarely it has yellowish color to it but most of the time its clear. It stays in his sinuses and drains down his throat all the time. Has had it for 4 months and can't get rid of it. States he has taken antibiotics and it didn't help. what does he need to do to get rid of it because its driving him crazy and making his throat raw  [facility]  Follow-up by: Everitt Amber LPN,  February 15, 2010 1:43 PM  Additional Follow-up for Phone Call Additional follow up Details #1::        i sent a prescription for Flonase nasal spray to the pharmacy for him.  Tell him to give it 2 weeks.  If his congestion is not improving after 2 wks then call back. Additional Follow-up by: Esperanza Sheets PA,  February 15, 2010 2:27 PM    Additional Follow-up for Phone Call Additional follow up Details #2::    Patient aware. Will call back in 2 weeks if no better Follow-up by: Everitt Amber LPN,  February 15, 2010 2:45 PM  New/Updated Medications: FLUTICASONE PROPIONATE 50 MCG/ACT SUSP (FLUTICASONE PROPIONATE) 2 sprays each nostril once daily Prescriptions: FLUTICASONE PROPIONATE 50 MCG/ACT SUSP (FLUTICASONE PROPIONATE) 2 sprays each nostril once daily  #1 x 1   Entered and Authorized by:   Esperanza Sheets PA   Signed by:   Esperanza Sheets PA on 02/15/2010   Method used:   Electronically to        [facility]* (retail)       [address]       [place], [place]  [address]       Ph: [phone]       Fax: [phone]   RxID:   [id number]

## 2011-01-17 NOTE — Letter (Signed)
Summary: consults  consults   Imported By: Curtis Sites 05/23/2010 13:14:12  _____________________________________________________________________  External Attachment:    Type:   Image     Comment:   External Document

## 2011-01-17 NOTE — Letter (Signed)
Summary: lab  lab   Imported By: Curtis Sites 05/23/2010 13:10:52  _____________________________________________________________________  External Attachment:    Type:   Image     Comment:   External Document

## 2011-01-17 NOTE — Progress Notes (Signed)
  Phone Note From Pharmacy   Caller: [facility]* Summary of Call: crestor requires pa Initial call taken by: Adella Hare LPN,  June 24, 453 4:09 PM  Follow-up for Phone Call        send in for pravastatin 40mg  2 at bedtime #60 refdill 4 pls , and fax with d/c order on crestor also stamp and let pt know Follow-up by: Syliva Overman MD,  June 26, 2010 5:17 AM  Additional Follow-up for Phone Call Additional follow up Details #1::        med sent, called patient, left message Additional Follow-up by: Adella Hare LPN,  June 27, 2010 11:44 AM    New/Updated Medications: PRAVASTATIN SODIUM 40 MG TABS (PRAVASTATIN SODIUM) 2 at bedtime PRAVASTATIN SODIUM 40 MG TABS (PRAVASTATIN SODIUM) 2 at bedtime discontinue crestor Prescriptions: PRAVASTATIN SODIUM 40 MG TABS (PRAVASTATIN SODIUM) 2 at bedtime discontinue crestor  #60 x 4   Entered by:   Adella Hare LPN   Authorized by:   Syliva Overman MD   Signed by:   Adella Hare LPN on 09/81/1914   Method used:   Electronically to        [facility]* (retail)       [address]       [place], [place]  [address]       Ph: [phone]       Fax: [phone]   RxID:   [id number] PRAVASTATIN SODIUM 40 MG TABS (PRAVASTATIN SODIUM) 2 at bedtime  #60 x 4   Entered and Authorized by:   Syliva Overman MD   Signed by:   Syliva Overman MD on 06/26/2010   Method used:   Historical   RxID:   [id number]

## 2011-01-17 NOTE — Letter (Signed)
Summary: demographic  demographic   Imported By: Curtis Sites 05/23/2010 13:10:29  _____________________________________________________________________  External Attachment:    Type:   Image     Comment:   External Document

## 2011-01-17 NOTE — Assessment & Plan Note (Signed)
Summary: OV   Vital Signs:  Patient profile:   55 year old male Height:      69 inches Weight:      197.25 pounds BMI:     29.23 O2 Sat:      98 % on Room air Pulse rate:   74 / minute Pulse rhythm:   regular Resp:     16 per minute BP sitting:   154 / 86  (left arm)  Vitals Entered By: Adella Hare LPN (June 21, 453 1:38 PM)  O2 Flow:  Room air CC: cough and pain in multiple sites Is Patient Diabetic? Yes Did you bring your meter with you today? No Comments complains of pain in multiple sites patient did not bring meds to ov   CC:  cough and pain in multiple sites.  History of Present Illness: Pt comews in stating that in the past 3 weeks he has had uncontrolled cough productive of yellow sputum, he denies nasl congestion, sinus pressure, fever or chills, he c/o uncontrolled baack pain which has inxceased in the last week with no associated aggravating factor. He rreports loss of vision bilaterally, right greater than left and requests referral for an eye exam He still smokes , with no quit date in mind. He reports good response to his antidepressants but reports continued stress.  Preventive Screening-Counseling & Management  Alcohol-Tobacco     Smoking Cessation Counseling: yes  Allergies: 1)  ! Septra 2)  ! Levaquin 3)  ! Allopurinol  Review of Systems      See HPI General:  Complains of chills, fatigue, fever, and weakness. Eyes:  Complains of vision loss-1 eye; markedf vision loss in right eye, . ENT:  Denies hoarseness, nasal congestion, postnasal drainage, and sinus pressure. CV:  Denies chest pain or discomfort, difficulty breathing while lying down, palpitations, and swelling of feet. Resp:  Complains of cough; denies sputum productive; chronic dry cough, post nasal drainage , no fever or chills. GI:  Denies abdominal pain, constipation, diarrhea, nausea, and vomiting. GU:  Denies incontinence and urinary frequency. MS:  Complains of joint pain, low  back pain, mid back pain, and stiffness. Derm:  Denies itching and rash. Neuro:  Complains of headaches; denies seizures, sensation of room spinning, and tingling. Psych:  Complains of anxiety, depression, and mental problems; denies sense of great danger, suicidal thoughts/plans, thoughts of violence, and unusual visions or sounds. Endo:  Denies excessive thirst and excessive urination. Heme:  Denies abnormal bruising and bleeding. Allergy:  Complains of seasonal allergies.  Physical Exam  General:  Well-developed,well-nourished,in no acute distress; alert,appropriate and cooperative throughout examination. HEENT: No facial asymmetry,  EOMI,no sinus tenderness, TM's Clear, oropharynx  pink and moist.   Chest: bilateral crackles withreduced air entry throughout  CVS: S1, S2, No murmurs, No S3.   Abd: Soft, Nontender.  MS: decreased  ROM spine, hips, shoulders and knees.  Ext: No edema.   CNS: CN 2-12 intact, power tone and sensation normal throughout.   Skin: Intact, no visible lesions or rashes.  Psych: Good eye contact, normal affect.  Memory intact, not anxious or depressed appearing.    Impression & Recommendations:  Problem # 1:  ACUTE BRONCHITIS (ICD-466.0) Assessment Comment Only  The following medications were removed from the medication list:    Penicillin V Potassium 500 Mg Tabs (Penicillin v potassium) .Marland Kitchen... Take 1 tablet by mouth three times a day    Tessalon Perles 100 Mg Caps (Benzonatate) .Marland Kitchen... Take 1 capsule  by mouth three times a day His updated medication list for this problem includes:    Penicillin V Potassium 500 Mg Tabs (Penicillin v potassium) .Marland Kitchen... Take 1 tablet by mouth three times a day    Tessalon Perles 100 Mg Caps (Benzonatate) .Marland Kitchen... Take 1 capsule by mouth three times a day  Problem # 2:  BACK PAIN WITH RADICULOPATHY (ICD-729.2) Assessment: Deteriorated toradol 60 mg IM administered  Problem # 3:  DEPRESSION (ICD-311) Assessment: Improved  His  updated medication list for this problem includes:    Diazepam 10 Mg Tabs (Diazepam) ..... One to two tabs by mouth two times a day prn    Cymbalta 60 Mg Cpep (Duloxetine hcl) ..... One cap by mouth qd  Problem # 4:  HYPERTENSION (ICD-401.9) Assessment: Unchanged  His updated medication list for this problem includes:    Lotensin Hct 20-25 Mg Tabs (Benazepril-hydrochlorothiazide) .Marland Kitchen... Take 1 tablet by mouth once a day    Norvasc 5 Mg Tabs (Amlodipine besylate) .Marland Kitchen... Take 1 tablet by mouth once a day  Orders: T-Basic Metabolic Panel [id number])  BP today: 154/86 Prior BP: 150/78 (01/13/2010)  Labs Reviewed: K+: 4.7 (07/21/2009) Creat: : 0.99 (07/21/2009)   Chol: 215 (07/21/2009)   HDL: 39 (07/21/2009)   LDL: 141 (07/21/2009)   TG: 175 (07/21/2009)  Problem # 5:  DIABETES MELLITUS, TYPE II (ICD-250.00) Assessment: Comment Only  His updated medication list for this problem includes:    Metformin Hcl 500 Mg Tabs (Metformin hcl) ..... One tab by mouth bid    Lotensin Hct 20-25 Mg Tabs (Benazepril-hydrochlorothiazide) .Marland Kitchen... Take 1 tablet by mouth once a day  Orders: T- Hemoglobin A1C ([id number])  Labs Reviewed: Creat: 0.99 (07/21/2009)    Reviewed HgBA1c results: 6.2 (10/29/2009)  6.2 (05/24/2009)  Problem # 6:  HYPERLIPIDEMIA (ICD-272.4) Assessment: Comment Only  The following medications were removed from the medication list:    Crestor 20 Mg Tabs (Rosuvastatin calcium) .Marland Kitchen... ,take 1 tab by mouth at bedtime His updated medication list for this problem includes:    Crestor 20 Mg Tabs (Rosuvastatin calcium) .Marland Kitchen... Take 1 tab by mouth at bedtime'  Orders: T-Lipid Profile [id number]) T-Hepatic Function [id number])  Labs Reviewed: SGOT: 13 (07/21/2009)   SGPT: 25 (07/21/2009)   HDL:39 (07/21/2009), 44 (12/18/2007)  LDL:141 (07/21/2009), 198 (12/18/2007)  Chol:215 (07/21/2009), 264 (12/18/2007)  Trig:175 (07/21/2009), 112 (12/18/2007)  Complete Medication List: 1)   Adderall 10 Mg Tabs (Amphetamine-dextroamphetamine) .... One tab by mouth two times a day 2)  Levitra 20 Mg Tabs (Vardenafil hcl) .... One tab by mouth once daily as needed 3)  Lotrisone 1-0.05 % Lotn (Clotrimazole-betamethasone) .... Uad 4)  Metformin Hcl 500 Mg Tabs (Metformin hcl) .... One tab by mouth bid 5)  Penicillin V Potassium 500 Mg Tabs (Penicillin v potassium) .... Take 1 tablet by mouth three times a day 6)  Tessalon Perles 100 Mg Caps (Benzonatate) .... Take 1 capsule by mouth three times a day 7)  Lotensin Hct 20-25 Mg Tabs (Benazepril-hydrochlorothiazide) .... Take 1 tablet by mouth once a day 8)  Fentanyl 75 Mcg/hr Pt72 (Fentanyl) .... One patch every two days 9)  Promethazine Hcl 25 Mg Tabs (Promethazine hcl) .... One to two tabs every 6 hours prn 10)  Diazepam 10 Mg Tabs (Diazepam) .... One to two tabs by mouth two times a day prn 11)  Cymbalta 60 Mg Cpep (Duloxetine hcl) .... One cap by mouth qd 12)  Nabumetone 750 Mg Tabs (Nabumetone) .... One tab by mouth bid  13)  Proctocream-hc 2.5 % Crea (Hydrocortisone) .... Apply two times a day prn 14)  Norvasc 5 Mg Tabs (Amlodipine besylate) .... Take 1 tablet by mouth once a day 15)  Tamiflu 75 Mg Caps (Oseltamivir phosphate) .... Take 1 capsule by mouth two times a day 16)  Apothecary Cough Syrup  .... 5 cc by mouth every 6 hrs as needed 17)  Fluticasone Propionate 50 Mcg/act Susp (Fluticasone propionate) .... 2 sprays each nostril once daily 18)  Prodigy Blood Glucose Test Strp (Glucose blood) .... Once daily testing 19)  Prodigy Twist Top Lancets 28g Misc (Lancets) .... Once daily testing 20)  Penicillin V Potassium 500 Mg Tabs (Penicillin v potassium) .... Take 1 tablet by mouth three times a day 21)  Crestor 20 Mg Tabs (Rosuvastatin calcium) .... Take 1 tab by mouth at bedtime'  Other Orders: Ketorolac-Toradol 15mg  ([id number]) Admin of Therapeutic Inj  intramuscular or subcutaneous ([id number])  Patient Instructions: 1)  Please  schedule a follow-up appointment in 3 months. 2)  Tobacco is very bad for your health and your loved ones! You Should stop smoking!. 3)  Stop Smoking Tips: Choose a Quit date. Cut down before the Quit date. decide what you will do as a substitute when you feel the urge to smoke(gum,toothpick,exercise). 4)  It is important that you exercise regularly at least 20 minutes 5 times a week. If you develop chest pain, have severe difficulty breathing, or feel very tired , stop exercising immediately and seek medical attention. 5)  You need to lose weight. Consider a lower calorie diet and regular exercise.  6)  you are beingtreated for bronchitis, and will receive an injectionin  7)  the office today for back pain. 8)  You will be referred to Dr. Nile Riggs for an eye exam, we will call with the appt 9)  BMP prior to visit, ICD-9: 10)  Hepatic Panel prior to visit, ICD-9: 11)  Lipid Panel prior to visit, ICD-9:  labs today 12)  HbgA1C prior to visit, ICD-9: 13)  Take delsyn 2 teaspoons twice daily fo your cough and med sentin also pl Prescriptions: CRESTOR 20 MG TABS (ROSUVASTATIN CALCIUM) Take 1 tab by mouth at bedtime'  #30 x 4   Entered and Authorized by:   Syliva Overman MD   Signed by:   Syliva Overman MD on 06/23/2010   Method used:   Electronically to        [facility]* (retail)       [address]       [place], [place]  [address]       Ph: [phone]       Fax: [phone]   RxID:   [id number] LOTENSIN HCT 20-25 MG TABS (BENAZEPRIL-HYDROCHLOROTHIAZIDE) Take 1 tablet by mouth once a day  #30 x 3   Entered by:   Adella Hare LPN   Authorized by:   Syliva Overman MD   Signed by:   Adella Hare LPN on 66/44/0347   Method used:   Electronically to        [facility]* (retail)       [address]       [place], [place]  [address]       Ph: [phone]       Fax: [phone]   RxID:   [id number] METFORMIN HCL 500 MG TABS (METFORMIN HCL)  one tab by mouth bid  #  60 x 3   Entered by:   Adella Hare LPN   Authorized by:   Syliva Overman MD   Signed by:   Adella Hare LPN on 78/29/5621   Method used:   Electronically to        [facility]* (retail)       [address]       [place], [place]  [address]       Ph: [phone]       Fax: [phone]   RxID:   [id number] PENICILLIN V POTASSIUM 500 MG TABS (PENICILLIN V POTASSIUM) Take 1 tablet by mouth three times a day  #30 x 0   Entered and Authorized by:   Syliva Overman MD   Signed by:   Syliva Overman MD on 06/21/2010   Method used:   Electronically to        [facility]* (retail)       [address]       [place], [place]  [address]       Ph: [phone]       Fax: [phone]   RxID:   [id number]    Medication Administration  Injection # 1:    Medication: Ketorolac-Toradol 15mg     Diagnosis: BACK PAIN WITH RADICULOPATHY (ICD-729.2)    Route: IM    Site: LUOQ gluteus    Exp Date: 11/18/2011    Lot #: [id number]    Mfr: [email or web address]    Patient tolerated injection without complications    Given by: Adella Hare LPN (June 22, 2375 3:29 PM)  Orders Added: 1)  Est. Patient Level IV [99214] 2)  T-Basic Metabolic Panel [80048-22910] 3)  T-Lipid Profile [80061-22930] 4)  T-Hepatic Function [80076-22960] 5)  T- Hemoglobin A1C [83036-23375] 6)  Ketorolac-Toradol 15mg  [J1885] 7)  Admin of Therapeutic Inj  intramuscular or subcutaneous [28315]

## 2011-01-17 NOTE — Assessment & Plan Note (Signed)
Summary: sick   Vital Signs:  Patient profile:   55 year old male Height:      69 inches Weight:      202 pounds BMI:     29.94 O2 Sat:      98 % on Room air Pulse rate:   76 / minute Pulse rhythm:   regular Resp:     16 per minute BP sitting:   160 / 70  (left arm)  Vitals Entered By: Adella Hare LPN (September 21, 2010 1:27 PM)  Nutrition Counseling: Patient's BMI is greater than 25 and therefore counseled on weight management options.  O2 Flow:  Room air CC: sinus pressure, cough, yellow sputum, back pain Is Patient Diabetic? Yes Did you bring your meter with you today? No Comments did not bring meds to ov   CC:  sinus pressure, cough, yellow sputum, and back pain.  History of Present Illness: current smoking 15/day 3 week h/o head and chest congestion with yellow nasal drainage andcough productive of yellow sputum. He  has had intermittent fever and chills also, he reports a poor apetite. He continuesto experience chronic back pai and is treatesd by a specialist for this. He denies polyuria or polydypsia, and he does not check his blood sugars on a regular basis.  Preventive Screening-Counseling & Management  Alcohol-Tobacco     Smoking Cessation Counseling: yes  Allergies (verified): 1)  ! Septra 2)  ! Levaquin 3)  ! Allopurinol  Review of Systems      See HPI General:  Complains of chills, fatigue, fever, loss of appetite, and malaise. Eyes:  Complains of blurring and vision loss-1 eye; reduced vision in right eye, questionable cataract. ENT:  Complains of postnasal drainage and sinus pressure; 3 week h/o frontal and maxillary pressure with yellow drainage. CV:  Denies chest pain or discomfort, palpitations, and swelling of feet. Resp:  Complains of cough, shortness of breath, and sputum productive;  3week h/o chest congestionn with yellow sputum . GI:  Denies abdominal pain, constipation, diarrhea, nausea, and vomiting. GU:  Denies dysuria and urinary  frequency. MS:  Complains of low back pain and mid back pain. Derm:  Denies itching and rash. Neuro:  Complains of headaches; denies seizures, tingling, visual disturbances, and weakness. Psych:  Complains of anxiety and depression. Endo:  Denies excessive thirst and excessive urination. Heme:  Denies abnormal bruising and bleeding. Allergy:  Complains of seasonal allergies.  Physical Exam  General:  Well-developed,well-nourished,in no acute distress; alert,appropriate and cooperative throughout examination HEENT: No facial asymmetry,  EOMI, frontal and maxillary  sinus tenderness, TM's Clear, oropharynx  pink and moist.   Chest: decreased air entry bilaterally , few crackles and wheezes CVS: S1, S2, No murmurs, No S3.   Abd: Soft, Nontender.  MS: Adequate ROM spine, hips, shoulders and knees.  Ext: No edema.   CNS: CN 2-12 intact, power tone and sensation normal throughout.   Skin: Intact, no visible lesions or rashes.  Psych: Good eye contact, normal affect.  Memory intact, not anxious or depressed appearing.    Impression & Recommendations:  Problem # 1:  UNSPECIFIED VISUAL LOSS (ICD-369.9) Assessment Deteriorated  Orders: Ophthalmology Referral (Ophthalmology)  Problem # 2:  ACUTE BRONCHITIS (ICD-466.0) Assessment: Comment Only  The following medications were removed from the medication list:    Penicillin V Potassium 500 Mg Tabs (Penicillin v potassium) .Marland Kitchen... Take 1 tablet by mouth three times a day His updated medication list for this problem includes:  Penicillin V Potassium 500 Mg Tabs (Penicillin v potassium) .Marland Kitchen... Take 1 tablet by mouth three times a day    Tessalon Perles 100 Mg Caps (Benzonatate) .Marland Kitchen... Take 1 capsule by mouth three times a day  Orders: CXR- 2view (CXR) Rocephin  250mg  (E4540)  Problem # 3:  BACK PAIN WITH RADICULOPATHY (ICD-729.2) Assessment: Deteriorated  Orders: Depo- Medrol 80mg  (J1040) Ketorolac-Toradol 15mg  (J8119) Admin of  Therapeutic Inj  intramuscular or subcutaneous (14782)  Problem # 4:  NICOTINE ADDICTION (ICD-305.1) Assessment: Improved  Encouraged smoking cessation and discussed different methods for smoking cessation.   Problem # 5:  DIABETES MELLITUS, TYPE II (ICD-250.00) Assessment: Comment Only  The following medications were removed from the medication list:    Lotensin Hct 20-25 Mg Tabs (Benazepril-hydrochlorothiazide) .Marland Kitchen... Take 1 tablet by mouth once a day His updated medication list for this problem includes:    Metformin Hcl 500 Mg Tabs (Metformin hcl) ..... One tab by mouth bid    Lotensin Hct 20-25 Mg Tabs (Benazepril-hydrochlorothiazide) .Marland Kitchen... Take 1 tablet by mouth once a day  Orders: Ophthalmology Referral (Ophthalmology) T- Hemoglobin A1C [phone])  Labs Reviewed: Creat: 0.66 (06/21/2010)    Reviewed HgBA1c results: 6.5 (06/21/2010)  6.2 (10/29/2009)  Problem # 6:  HYPERTENSION (ICD-401.9) Assessment: Deteriorated  The following medications were removed from the medication list:    Lotensin Hct 20-25 Mg Tabs (Benazepril-hydrochlorothiazide) .Marland Kitchen... Take 1 tablet by mouth once a day    Norvasc 5 Mg Tabs (Amlodipine besylate) .Marland Kitchen... Take 1 tablet by mouth once a day His updated medication list for this problem includes:    Norvasc 5 Mg Tabs (Amlodipine besylate) .Marland Kitchen... Take 1 tablet by mouth once a day    Lotensin Hct 20-25 Mg Tabs (Benazepril-hydrochlorothiazide) .Marland Kitchen... Take 1 tablet by mouth once a day  Orders: T-Basic Metabolic Panel ([id number])  BP today: 160/70 Prior BP: 154/86 (06/21/2010)  Labs Reviewed: K+: 4.4 (06/21/2010) Creat: : 0.66 (06/21/2010)   Chol: 263 (06/21/2010)   HDL: 40 (06/21/2010)   LDL: 167 (06/21/2010)   TG: 281 (06/21/2010)  Problem # 7:  HYPERLIPIDEMIA (ICD-272.4) Assessment: Comment Only  His updated medication list for this problem includes:    Pravastatin Sodium 40 Mg Tabs (Pravastatin sodium) .Marland Kitchen... 2 at bedtime discontinue  crestor Low fat diet discussed and encouraged, and literature also given  Orders: T-Hepatic Function [phone])  Labs Reviewed: SGOT: 14 (06/21/2010)   SGPT: 24 (06/21/2010)   HDL:40 (06/21/2010), 39 (07/21/2009)  LDL:167 (06/21/2010), 141 (07/21/2009)  Chol:263 (06/21/2010), 215 (07/21/2009)  Trig:281 (06/21/2010), 175 (07/21/2009)  Problem # 8:  ACUTE SINUSITIS, UNSPECIFIED (ICD-461.9) Assessment: Comment Only  The following medications were removed from the medication list:    Penicillin V Potassium 500 Mg Tabs (Penicillin v potassium) .Marland Kitchen... Take 1 tablet by mouth three times a day His updated medication list for this problem includes:    Penicillin V Potassium 500 Mg Tabs (Penicillin v potassium) .Marland Kitchen... Take 1 tablet by mouth three times a day    Tessalon Perles 100 Mg Caps (Benzonatate) .Marland Kitchen... Take 1 capsule by mouth three times a day    Fluticasone Propionate 50 Mcg/act Susp (Fluticasone propionate) .Marland Kitchen... 2 sprays each nostril once daily  Complete Medication List: 1)  Adderall 10 Mg Tabs (Amphetamine-dextroamphetamine) .... One tab by mouth two times a day 2)  Levitra 20 Mg Tabs (Vardenafil hcl) .... One tab by mouth once daily as needed 3)  Lotrisone 1-0.05 % Lotn (Clotrimazole-betamethasone) .... Uad 4)  Metformin Hcl 500 Mg Tabs (Metformin hcl) .Marland KitchenMarland KitchenMarland Kitchen  One tab by mouth bid 5)  Penicillin V Potassium 500 Mg Tabs (Penicillin v potassium) .... Take 1 tablet by mouth three times a day 6)  Tessalon Perles 100 Mg Caps (Benzonatate) .... Take 1 capsule by mouth three times a day 7)  Fentanyl 75 Mcg/hr Pt72 (Fentanyl) .... One patch every two days 8)  Promethazine Hcl 25 Mg Tabs (Promethazine hcl) .... One to two tabs every 6 hours prn 9)  Diazepam 10 Mg Tabs (Diazepam) .... One to two tabs by mouth two times a day prn 10)  Cymbalta 60 Mg Cpep (Duloxetine hcl) .... One cap by mouth qd 11)  Nabumetone 750 Mg Tabs (Nabumetone) .... One tab by mouth bid 12)  Proctocream-hc 2.5 % Crea  (Hydrocortisone) .... Apply two times a day prn 13)  Apothecary Cough Syrup  .... 5 cc by mouth every 6 hrs as needed 14)  Fluticasone Propionate 50 Mcg/act Susp (Fluticasone propionate) .... 2 sprays each nostril once daily 15)  Prodigy Blood Glucose Test Strp (Glucose blood) .... Once daily testing 16)  Prodigy Twist Top Lancets 28g Misc (Lancets) .... Once daily testing 17)  Pravastatin Sodium 40 Mg Tabs (Pravastatin sodium) .... 2 at bedtime discontinue crestor 18)  Penicillin V Potassium 500 Mg Tabs (Penicillin v potassium) .... Take 1 tablet by mouth three times a day 19)  Norvasc 5 Mg Tabs (Amlodipine besylate) .... Take 1 tablet by mouth once a day 20)  Lotensin Hct 20-25 Mg Tabs (Benazepril-hydrochlorothiazide) .... Take 1 tablet by mouth once a day  Other Orders: T-CBC w/Diff ([id number]) T-TSH [phone]) T-PSA [phone]) T-Vitamin D (25-Hydroxy) [phone])  Patient Instructions: 1)  Please schedule a follow-up appointment in 3 months. 2)  pls return for flu vaccine in 3 weeks 3)  Tobacco is very bad for your health and your loved ones! You Should stop smoking!. 4)  Stop Smoking Tips: Choose a Quit date. Cut down before the Quit date. decide what you will do as a substitute when you feel the urge to smoke(gum,toothpick,exercise). 5)  you will get injection s for bronchitis and back pain.also you need a CXR 6)  fasting labs today Prescriptions: LOTENSIN HCT 20-25 MG TABS (BENAZEPRIL-HYDROCHLOROTHIAZIDE) Take 1 tablet by mouth once a day  #30 x 4   Entered and Authorized by:   Syliva Overman MD   Signed by:   Syliva Overman MD on 09/21/2010   Method used:   Electronically to        [facility]* (retail)       [address]       [place], [place]  [address]       Ph: [phone]       Fax: [phone]   RxID:   [id number] NORVASC 5 MG TABS (AMLODIPINE BESYLATE) Take 1 tablet by mouth once a day  #30 x 4   Entered and Authorized by:    Syliva Overman MD   Signed by:   Syliva Overman MD on 09/21/2010   Method used:   Electronically to        [facility]* (retail)       [address]       [place], [place]  [address]       Ph: [phone]       Fax: [phone]   RxID:   [id number] PENICILLIN V POTASSIUM 500 MG TABS (PENICILLIN V POTASSIUM) Take 1 tablet by  mouth three times a day  #63 x 0   Entered and Authorized by:   Syliva Overman MD   Signed by:   Syliva Overman MD on 09/21/2010   Method used:   Electronically to        [facility]* (retail)       [address]       [place], [place]  [address]       Ph: [phone]       Fax: [phone]   RxID:   [id number]    Medication Administration  Injection # 1:    Medication: Depo- Medrol 80mg     Diagnosis: BACK PAIN WITH RADICULOPATHY (ICD-729.2)    Route: IM    Site: RUOQ gluteus    Exp Date: 04/2011    Lot #: [id number]    Mfr: Pharmacia    Patient tolerated injection without complications    Given by: Mauricia Area CMA  Injection # 2:    Medication: Ketorolac-Toradol 15mg     Diagnosis: BACK PAIN WITH RADICULOPATHY (ICD-729.2)    Route: IM    Site: RUOQ gluteus    Exp Date: 02/16/2012    Lot #: [id number]    Mfr: [email or web address]    Comments: 60 mg given    Patient tolerated injection without complications    Given by: Mauricia Area CMA  Injection # 3:    Medication: Rocephin  250mg     Diagnosis: ACUTE BRONCHITIS (ICD-466.0)    Route: IM    Site: LUOQ gluteus    Exp Date: 12/2012    Lot #: [id number]    Mfr: [email or web address]    Comments: 500 mg given    Patient tolerated injection without complications    Given by: Mauricia Area CMA  Orders Added: 1)  CXR- 2view [CXR] 2)  Est. Patient Level IV [40347] 3)  Ophthalmology Referral [Ophthalmology] 4)  T-CBC w/Diff [[id number]] 5)  T-TSH [[id number]] 6)  T- Hemoglobin A1C [83036-23375] 7)  T-PSA [41660-63016] 8)  T-Basic Metabolic Panel [80048-22910] 9)   T-Hepatic Function [80076-22960] 10)  T-Vitamin D (25-Hydroxy) [01093-23557] 11)  Depo- Medrol 80mg  [J1040] 12)  Ketorolac-Toradol 15mg  [J1885] 13)  Rocephin  250mg  [J0696] 14)  Admin of Therapeutic Inj  intramuscular or subcutaneous [32202]

## 2011-01-17 NOTE — Letter (Signed)
Summary: phone notes  phone notes   Imported By: Curtis Sites 05/23/2010 13:18:39  _____________________________________________________________________  External Attachment:    Type:   Image     Comment:   External Document

## 2011-01-17 NOTE — Progress Notes (Signed)
Summary: referral  Phone Note Call from Patient   Summary of Call: do I need to referral this pt to a urology? still having trouble. [phone] Initial call taken by: Rudene Anda,  Apr 21, 2010 9:47 AM  Follow-up for Phone Call        pls refer to urology, he was in the ed recently with a kidney stone Follow-up by: Syliva Overman MD,  Apr 21, 2010 9:49 AM  Additional Follow-up for Phone Call Additional follow up Details #1::        pt has appt at dr. Rito Ehrlich office for 04/26/2010 11:00. pt notified  Additional Follow-up by: Rudene Anda,  Apr 21, 2010 10:00 AM

## 2011-01-17 NOTE — Letter (Signed)
Summary: xray  xray   Imported By: Curtis Sites 05/23/2010 13:12:32  _____________________________________________________________________  External Attachment:    Type:   Image     Comment:   External Document

## 2011-01-19 NOTE — Progress Notes (Signed)
Summary: sick  Phone Note Call from Patient   Summary of Call: shelia called and said that he was sick and needed to see the dr today and i told her dr was not here she would have to take him to ed or urgent care Initial call taken by: Lind Guest,  December 21, 2010 9:11 AM  Follow-up for Phone Call        noted Follow-up by: Adella Hare LPN,  December 21, 2010 9:21 AM

## 2011-01-19 NOTE — Letter (Signed)
Summary: 1st missed letter  1st missed letter   Imported By: Lind Guest 01/06/2011 14:09:16  _____________________________________________________________________  External Attachment:    Type:   Image     Comment:   External Document

## 2011-03-09 ENCOUNTER — Encounter: Payer: Self-pay | Admitting: Family Medicine

## 2011-03-10 ENCOUNTER — Encounter: Payer: Self-pay | Admitting: Family Medicine

## 2011-03-13 ENCOUNTER — Ambulatory Visit (INDEPENDENT_AMBULATORY_CARE_PROVIDER_SITE_OTHER): Payer: Medicaid Other | Admitting: Family Medicine

## 2011-03-13 ENCOUNTER — Encounter: Payer: Self-pay | Admitting: Family Medicine

## 2011-03-13 VITALS — BP 140/80 | HR 68 | Temp 98.1°F | Resp 16 | Ht 67.0 in | Wt 202.1 lb

## 2011-03-13 DIAGNOSIS — E785 Hyperlipidemia, unspecified: Secondary | ICD-10-CM

## 2011-03-13 DIAGNOSIS — I10 Essential (primary) hypertension: Secondary | ICD-10-CM

## 2011-03-13 DIAGNOSIS — J329 Chronic sinusitis, unspecified: Secondary | ICD-10-CM

## 2011-03-13 DIAGNOSIS — E118 Type 2 diabetes mellitus with unspecified complications: Secondary | ICD-10-CM

## 2011-03-13 DIAGNOSIS — M549 Dorsalgia, unspecified: Secondary | ICD-10-CM

## 2011-03-13 MED ORDER — PRAVASTATIN SODIUM 80 MG PO TABS: 80.0000 mg | ORAL_TABLET | Freq: Every evening | ORAL | Status: DC

## 2011-03-13 MED ORDER — KETOROLAC TROMETHAMINE 60 MG/2ML IM SOLN
60.0000 mg | Freq: Once | INTRAMUSCULAR | Status: AC
  Administered 2011-03-13: 60 mg via INTRAMUSCULAR

## 2011-03-13 MED ORDER — METHYLPREDNISOLONE ACETATE 80 MG/ML IJ SUSP
80.0000 mg | Freq: Once | INTRAMUSCULAR | Status: AC
  Administered 2011-03-13: 80 mg via INTRAMUSCULAR

## 2011-03-13 MED ORDER — AMLODIPINE BESYLATE 10 MG PO TABS: 10.0000 mg | ORAL_TABLET | Freq: Every day | ORAL | Status: DC

## 2011-03-13 MED ORDER — CETIRIZINE HCL 10 MG PO TABS
10.0000 mg | ORAL_TABLET | Freq: Every day | ORAL | Status: DC | PRN
Start: 2011-03-13 — End: 2012-04-15

## 2011-03-13 MED ORDER — METFORMIN HCL 500 MG PO TABS: 500.0000 mg | ORAL_TABLET | Freq: Two times a day (BID) | ORAL | Status: DC

## 2011-03-13 MED ORDER — FLUTICASONE PROPIONATE 50 MCG/ACT NA SUSP: 2.0000 | Freq: Every day | NASAL | Status: DC

## 2011-03-13 NOTE — Patient Instructions (Addendum)
F/u in 3 months.  Your blood pressure is high, the amlodipine dose is increased to 10mg  one daily.  You need to stop smoking , pls continue to slow down current is approx 15/day.  Injections today for back pain.  You are being referred to dr. Nile Riggs.  Labs today.  Meds are sent for allergies.   Correct dose of pravastatin is 80mg  one daily.

## 2011-04-03 NOTE — Progress Notes (Signed)
Subjective:    Patient ID: Mark Walls, male    DOB: 1956-11-30, 55 y.o.   MRN: [id number]  HPI The PT is here for follow up and re-evaluation of chronic medical conditions, medication management and review of recent lab and radiology data.  Preventive health is updated, specifically  Cancer screening, Osteoporosis screening and Immunization.   He c/o increased back pain radiating down legs and would like njection s in the office today, and also uncontrolled allergy symptoms both  in the past 2 weeks. He checks his blood sugars irregularly, but they are seldom over 120. He is non compliant with his cholesterol med, states he gets nightmares. He still smokes , and is trying to quit, no date is being set at this time.    Review of Systems Denies recent fever or chills. Reports sinus pressure, nasal congestion,  And sore throat. Denies roductive cough or wheezing.He has chronic chest congestion and a smoker's cough. Denies chest pains, palpitations, paroxysmal nocturnal dyspnea, orthopnea and leg swelling Denies abdominal pain, nausea, vomiting,diarrhea or constipation.  Denies rectal bleeding or change in bowel movement. Denies dysuria, frequency, hesitancy or incontinence. Intermittent headaches,also numbness,and  Tingling in both lower extremities Denies uncontrolled  depression, anxiety or insomnia.He is on medication for all 3. Denies skin break down or rash.        Objective:   Physical Exam Patient alert and oriented and in no Cardiopulmonary distress.  HEENT: No facial asymmetry, EOMI, no sinus tenderness, TM's clear, Oropharynx pink and moist.  Neck supple no adenopathy.  Chest:Decreased air entry bilaterally, scattered crackles, no wheezes  CVS: S1, S2 no murmurs, no S3.  ABD: Soft non tender. Bowel sounds normal.  Ext: No edema  WU:[id number]  ROM spine, shoulders, hips and knees.  Skin: Intact, no ulcerations or rash noted.  Psych: Good eye contact, normal  affect. Memory loss, not anxious or depressed appearing.  CNS: CN 2-12 intact, power, tone and sensation normal throughout.        Assessment & Plan:  1.Hypertension : uncontrolled, dose increase of medication, and importance of compliance stressed 2. Nicotine use; unchanged, cessation counseling done 3.Type 2 DM: controlled ,  No med change Back pain : deteriorated, injections administered at oV 5. Seasonal allergies: deteriorated, pt to take allergy meds on a daily basis instead of sporadically.

## 2011-05-02 NOTE — H&P (Signed)
NAME:  Mark Walls, Mark Walls                ACCOUNT NO.:  [id number]   MEDICAL RECORD NO.:  [id number]          PATIENT TYPE:  AMB   LOCATION:  DAY                           FACILITY:  [facility]   PHYSICIAN:  J. Darreld Mclean, M.D. DATE OF BIRTH:  1956/06/30   DATE OF ADMISSION:  DATE OF DISCHARGE:  LH                              HISTORY & PHYSICAL   CHIEF COMPLAINT:  My left knee hurts.   Patient seen by me the first time on March 19, 2008, complaining of pain  and tenderness in the left knee.  He is 55 years old.  Dr. Lodema Hong had  seen him and asked that I see him.  He has had popping, giving way and  swelling of the left knee.  Denies any trauma.  He says it has been  bothering him for over 2 months.  He has fallen because of giving way.  I was concerned about a medial meniscal tear.  He was seen at [facility] and had an MRI of the knee.  It showed a posterior horn tear of  the medial meniscus of the left knee with degenerative arthritis and the  medial and patellofemoral compartments as well as a small intra-  articular loose body medially.  I informed him of the findings on April 02, 2008.  He has elected to have surgery on the 23rd as an outpatient.  I have gone over the risks and imponderables of the procedure and he  appears to understand and agrees to the procedure as outlined.   PAST MEDICAL HISTORY:  Positive for  1. Nerve disorder.  2. Hypertension.  3. Diabetes.  4. Ulcer disease.  5. Circulatory problems.  6. He has had problems with cholesterol and chronic fatigue.  7. Peripheral vascular disease.   ALLERGIES:  He has no allergies.   MEDICATIONS:  1. Vicodin.  2. Benicar 40 daily.  3. Crestor 40 daily.  4. Diazepam 5 p.r.n.  5. Fentanyl pain patch 75 mcg.   The patient smokes, does not use alcohol.   PAST SURGICAL HISTORY:  Status post cholecystectomy surgery 5 years ago  and 3 heart stents 3 years ago.   FAMILY HISTORY:  He says cancer runs in the family  but he has not had  it.  [place]  Does not list a marital  status.   PHYSICAL EXAMINATION:  VITAL SIGNS:  BP is 152/82, pulse 60,  respirations 16, afebrile.  Height 5 feet 8-3/4, weight 212 pounds.  GENERAL APPEARANCE:  Patient alert, cooperative and oriented.  HEENT:  Negative.  NECK:  Supple.  LUNGS:  Clear to P&A.  HEART:  Regular without murmur.  ABDOMEN:  Soft, tender without masses, obese.  EXTREMITIES:  Pain and tenderness in the left knee with an effusion and  pain particularly over the medial joint line.  He has crepitus to the  knee.  He has a positive McMurray on the left.  He has got some  decreased sensation of both feet consistent with peripheral vascular  disease.  CNS:  Otherwise  normal.  SKIN:  Intact.   IMPRESSION:  Tear of the medial meniscus of the left knee.   PLAN:  Operative arthroscopy of the left knee as an elective outpatient  procedure.   Labs are pending.                                            ______________________________  J. Darreld Mclean, M.D.     JWK/MEDQ  D:  04/06/2008  T:  04/06/2008  Job:  [id number]

## 2011-05-02 NOTE — Op Note (Signed)
NAME:  Mark Walls, Mark Walls                ACCOUNT NO.:  [id number]   MEDICAL RECORD NO.:  [id number]          PATIENT TYPE:  AMB   LOCATION:  DAY                           FACILITY:  [facility]   PHYSICIAN:  J. Darreld Mclean, M.D. DATE OF BIRTH:  February 05, 1956   DATE OF PROCEDURE:  DATE OF DISCHARGE:                               OPERATIVE REPORT   PREOPERATIVE DIAGNOSIS:  Tear medial meniscus of the left knee.   POSTOPERATIVE DIAGNOSIS:  Tear medial meniscus of the left knee.   OPERATIVE PROCEDURE:  Operative arthroscopy, partial medial meniscectomy  with a laser of the left knee.   ANESTHESIA:  General.   TOURNIQUET TIME:  26 minutes.   SURGEON:  J. Darreld Mclean, MD   INDICATIONS:  The patient is a 55 year old male with pain and tenderness  in his knee on the left.  He says  giving away.  MRI shows tear of the  medial meniscus of the left knee.  It has not improved by conservative  treatment.  Arthroscopy was recommended.  I explained the risks and  imponderables of the procedure preoperatively.  He understood.  Asked  appropriate questions.  He understands this is an elective procedure.   DESCRIPTION OF PROCEDURE:  The patient marked to the left knee in the  holding area and I placed a mark on the left knee.  He was brought to  the operating room, placed supine on the operating room table.  Tourniquet and leg holder were placed on the left upper thigh after  general anesthesia  had been induced.  He was prepped and draped in the  usual manner.  A time-out identifying Mr. Friedrich as the patient and  the left knee as correct surgical site.  Leg was elevated, wrapped  circumferentially with Esmarch bandage, tourniquet flattered to 300  mmHg.  Esmarch bandage removed.  Inflow cannula inserted medially,  lactated Ringer's instilled to the knee via an infusion pump.  Arthroscope inserted laterally and the knee was systematically examined.  The suprapatella pouch had mild synovitis and it was  noted grade II to  III changes on the patella.  There are  grade III changes in the medial  side of the knee with a tear in the posterior horn.  He had some pitting  in the knee also with a couple areas of grade IV changes.  Anterior  cruciate was intact laterally.  Initially, it was difficult to see  what  could be seen and there was no tear in the grade II changes.  I did not  appreciate any loose bodies.  Using a meniscal shaver and meniscal  punch, a good contour was obtained, but I wanted  to get a little better  and so the laser was brought forward.  The holmium laser was used and a  good smooth contour was obtained.  Pertinent pictures were taken.  Knee  was systematically reexamined and no pathology found and no loose  bodies.  Knee was irrigated with remainder part of lactated Ringer's.  We reapproximated  using 3-0 nylon interrupted vertical mattress manner,  Marcaine  0.25% was instilled  in each portal.  Tourniquet deflated after 26 minutes.  Sterile dressing  applied.  Bulky dressing applied.  The patient tolerated the procedure  well.  Physical therapy has been arranged next week.  Prescription for  pain he already has.  He also takes fentanyl patch.  I will see him in  the office in approximately in 10 days to 2 weeks.           ______________________________  Shela Commons. Darreld Mclean, M.D.     JWK/MEDQ  D:  04/09/2008  T:  04/10/2008  Job:  [id number]

## 2011-05-05 NOTE — H&P (Signed)
NAME:  Mark Walls                          ACCOUNT NO.:  [id number]   MEDICAL RECORD NO.:  [id number]                   PATIENT TYPE:  AMB   LOCATION:  DAY                                  FACILITY:  [facility]   PHYSICIAN:  Jerolyn Shin C. Mark Walls, M.D.                DATE OF BIRTH:  Nov 19, 1956   DATE OF ADMISSION:  DATE OF DISCHARGE:                                HISTORY & PHYSICAL   HISTORY OF PRESENT ILLNESS:  A 55 year old male with a history of  postprandial nausea without vomiting, chronic left lower quadrant pain, with  occasional episodes of rectal bleeding.  He has rectal bleeding about three  to four times per week.  He is scheduled for upper and lower endoscopy.   PAST MEDICAL HISTORY:  He has chronic obstructive lung disease, chronic low  back pain, lumbar disk disease, depression, hyperlipidemia, osteoarthritis.   MEDICATIONS:  Vicodin b.i.d., Prolex DH b.i.d., Combivent MDI t.i.d., Zoloft  150 mg b.i.d., Zocor 80 mg q.h.s., Neurontin 300 mg q.h.s.   PAST SURGICAL HISTORY:  Cholecystectomy.   SOCIAL HISTORY:  Smokes one pack of cigarettes per day.  He is married,  unemployed.   PHYSICAL EXAMINATION:  VITAL SIGNS:  Blood pressure 132/84, pulse 74,  respirations 20, weight 206 pounds.  HEENT:  Unremarkable.  NECK:  Supple without JVD or bruit.  CHEST:  Clear to auscultation, no rales, rubs, rhonchi, or wheezes.  CARDIAC:  Regular rate and rhythm without murmur, gallop, or rub.  ABDOMEN:  Surgical scars from cholecystectomy.  Tenderness, left lower  quadrant.  No masses.  EXTREMITIES:  No cyanosis, clubbing, or edema.  NEUROLOGIC:  No focal motor, sensory, or cerebellar deficits.   IMPRESSION:  1. Recurrent abdominal pain.  2. Rectal bleeding.  3. Chronic low back pain.  4. Osteoarthritis.  5. Depression with anxiety.   PLAN:  EGD and colonoscopy.                                               Mark Walls. Mark Walls, M.D.   LCS/MEDQ  D:  09/23/2002  T:  09/24/2002  Job:   [id number]

## 2011-05-05 NOTE — H&P (Signed)
NAME:  Mark Walls, Mark Walls                          ACCOUNT NO.:  [id number]   MEDICAL RECORD NO.:  [id number]                   PATIENT TYPE:  AMB   LOCATION:  DAY                                  FACILITY:  [facility]   PHYSICIAN:  Jerolyn Shin C. Katrinka Blazing, M.D.                DATE OF BIRTH:  [birth year]   DATE OF ADMISSION:  08/01/2002  DATE OF DISCHARGE:                                HISTORY & PHYSICAL   HISTORY OF PRESENT ILLNESS:  [age]-year-old male with history of  chronic abdominal pain, nausea and vomiting which occurs daily.  He has  occasional episodes of diarrhea.  Weight is stable.  He has had increasing  indigestion and heartburn.  The patient has documented cholelithiasis with  chronic biliary colic and is scheduled for laparoscopic cholecystectomy.  The patient also has some lower abdominal pain, so we will evaluate his  lower abdomen laparoscopically.   PAST HISTORY:  He has a history of lumbar disk disease with chronic low back  pain, depression, hyperlipidemia, osteoarthritis with diffuse joint pain.   MEDICATIONS:  1. Zoloft 150 mg b.i.d.  2. Zocor 80 mg q.h.s.  3. Arthrotec 75 mg b.i.d.  4. Neurontin 300 mg q.h.s.  5. Combivent two puffs t.i.d.  6. Vicodin one p.o. q.i.d.   REVIEW OF SYSTEMS:  Review of systems is positive for fatigue, insomnia,  short-term memory loss.   SOCIAL HISTORY:  The patient is married with two children.   PHYSICAL EXAMINATION:  VITAL SIGNS:  Blood pressure 138/80, pulse 68,  respirations 18.  Weight 204 pounds.  HEENT:  Unremarkable.  NECK:  Neck is supple without JVD or bruit.  CHEST:  Clear to auscultation.  HEART:  Regular rate and rhythm without any murmur, gallop or rub.  ABDOMEN:  Mild right upper quadrant tenderness with moderate tenderness in  the left lower quadrant.  Normal bowel sounds.  EXTREMITIES:  No cyanosis, clubbing or edema.  NEUROLOGIC:  No focal motor, sensory or cerebellar deficits.   IMPRESSION:  1.  Cholelithiasis with chronic biliary colic.  2. Chronic left lower quadrant pain.  3. Chronic low back pain.  4.     Depression with anxiety.  5. Osteoarthritis.   PLAN:  The patient will have laparoscopic cholecystectomy and laparoscopic  evaluation of the lower abdomen.                                               Dirk Dress. Katrinka Blazing, M.D.    LCS/MEDQ  D:  07/31/2002  T:  08/01/2002  Job:  [id number]

## 2011-05-05 NOTE — Op Note (Signed)
NAME:  Mark Walls, Mark Walls                          ACCOUNT NO.:  [id number]   MEDICAL RECORD NO.:  [id number]                   PATIENT TYPE:  OBV   LOCATION:  A307                                 FACILITY:  [facility]   PHYSICIAN:  Dirk Dress. Katrinka Blazing, M.D.                DATE OF BIRTH:  Jun 20, 1956   DATE OF PROCEDURE:  08/01/2002  DATE OF DISCHARGE:  08/02/2002                                 OPERATIVE REPORT   PREOPERATIVE DIAGNOSES:  1. Cholelithiasis.  2. Cholecystitis.  3. Chronic pelvic and left lower quadrant pain.   POSTOPERATIVE DIAGNOSES:  1. Cholelithiasis.  2. Cholecystitis.  3. 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   OPERATIVE PROCEDURE:  1.Laparoscopic cholecystectomy.  1. Laparoscopic pelvic adhesiolysis.   SURGEON:  Dirk Dress. Katrinka Blazing, M.D.   DESCRIPTION OF PROCEDURE:  Under general anesthesia, the patient's abdomen  was prepped and draped into a sterile field. A supraumbilical incision was  made. The Veress needle was inserted uneventfully and the abdomen was  insufflated with 3 L of CO2. Using a Visiport guide, a 10 mm port was  placed. The gallbladder was visualized. There were extensive adhesions in  the pelvis involving the sigmoid colon, bladder and omentum. No small bowel  appeared to be involved. The patient was positioned in the reverse  Trendelenburg position. Under videoscopic guidance, the 10 mm port and two 5  mm ports were placed without difficulty. The gallbladder was grasped and  positioned. The cystic duct was dissected, clipped close to the gallbladder  with five clips and divided. The cystic artery branches were two in number;  they were each clipped with three clips and divided. Using electrocautery,  the gallbladder was separated from the infrahepatic space without  difficulty. The gallbladder was advanced and retrieved uneventfully.  Hemostasis in the gallbladder bed was achieved without difficulty, and  copious irrigation was  carried out. Next, the patient was placed in deep  Trendelenburg position. A 5 mm port was placed in the suprapubic midline.  There were extensive adhesions in this area and care was taken not to injure  any of this. A 12 mm port was placed in the left lower quadrant under  videoscopic guidance. There were extensive adhesions from the distal  descending colon and proximal sigmoid colon. These were adhesions were  between the colon and the anterior abdominal peritoneum, as well as omentum.  Using all sharp dissection, the adhesions were lysed close to the  peritoneum. In some areas, the peritoneum was denuded but this was done to  prevent any injury to the bowel. Once the sigmoid colon was freed, the  adhesions between the bladder and omentum were taken down without  difficulty. The rectum was essentially adhesed but it was felt that it was  not causing any problems and to prevent vascular injury, it was not  disturbed. The patient tolerated the procedure well. There  was significant  thickening of the sigmoid colon suggesting that this may be an inflammatory  process of a chronic nature from old diverticulitis. This will have to be  evaluated at a later date. The patient tolerated the procedure well.  Thorough irrigation was carried out. There was no bleeding. CO2 was allowed  to escape from the abdomen and all the ports were removed. The  incisions were closed using #0 Dexon on the fascia of the three larger  incisions and staples on the skin. Dressings were placed. He was awakened  from anesthesia uneventfully, transferred to a bed and taken to the  postanesthetic care unit.                                                 Dirk Dress. Katrinka Blazing, M.D.    LCS/MEDQ  D:  08/01/2002  T:  08/06/2002  Job:  [id number]

## 2011-05-05 NOTE — Op Note (Signed)
NAME:  ANTAVIUS, SPERBECK NO.:  [id number]   MEDICAL RECORD NO.:  [id number]          PATIENT TYPE:  OIB   LOCATION:  2899                         FACILITY:  [facility]   PHYSICIAN:  Salvadore Farber, M.D. LHCDATE OF BIRTH:  04-26-1956   DATE OF PROCEDURE:  10/07/2004  DATE OF DISCHARGE:                                 OPERATIVE REPORT   PROCEDURE:  Right common iliac artery stenting.   INDICATIONS:  Mr. Heckard is a 55 year old gentleman with multiple  atherosclerotic risk factors, including ongoing tobacco abuse, who presents  with lifestyle-limiting claudication.  I performed cardiac catheterization  yesterday.  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  I discussed treatment options with him, and he  decided upon stenting of the right common iliac artery.  He returns today  for that procedure.   PROCEDURAL TECHNIQUE:  Informed consent was obtained.  Under 1% lidocaine  local anesthesia, a 4 French sheath was placed in the left common femoral  artery and a 6 French sheath in the right common femoral artery using the  modified Seldinger technique.  A 4 French pigtail catheter was advanced to  the distal abdominal aorta.  Abdominal aortography was performed by power  injection.  A Wholey wire was advanced across the stenosis via the right  femoral artery access.  The lesion was directly stented using a 7 x 39  Genesis on OptiPro, deploying it at 14 atmospheres.  The entirety of the  stent was then post-dilated using an 8 x 20 mm Powerflex for two successive  inflations at 16 atmospheres proximally and 14 atmospheres distally.  Final  angiography demonstrated no residual stenosis.  The peak gradient had  declined from 40 mmHg to 5 mmHg.  At the completion of the procedure the  patient had a 2+ DP and PT pulse on the right.   IMPRESSION/RECOMMENDATIONS:  Successful stenting of the right common iliac  artery with excellent angiographic and hemodynamic result.  The patient will  be continued on Plavix for a minimum of one month.  Aspirin will be  continued indefinitely.  Smoking cessation was strongly encouraged.   Will check postprocedure ABIs in one week.  Repeat ABIs in six months.       WED/MEDQ  D:  10/07/2004  T:  10/07/2004  Job:  [id number]   cc:   Milus Mallick. Lodema Hong, M.D.  [address]  [place], [place] [address]  Fax: [phone]   Pricilla Riffle, M.D.

## 2011-05-05 NOTE — Cardiovascular Report (Signed)
NAME:  Mark Walls, Mark Walls NO.:  [id number]   MEDICAL RECORD NO.:  [id number]          PATIENT TYPE:  OIB   LOCATION:  6501                         FACILITY:  [facility]   PHYSICIAN:  Salvadore Farber, M.D. LHCDATE OF BIRTH:  1956-11-19   DATE OF PROCEDURE:  DATE OF DISCHARGE:                              CARDIAC CATHETERIZATION   PROCEDURE:  Left heart catheterization, left ventriculography, coronary  angiography, abdominal aortography with bilateral lower extremity run-off to  the level of the femoral arteries.   INDICATION:  Mark Walls is a 55 year old gentleman with risk factors of  hypertension, dyslipidemia, diabetes mellitus, ongoing tobacco abuse.  He  presents with chest discomfort with exertion progressive over the past year  or greater.  He additionally complains of right greater than left hip and  thigh discomfort with walking.  Right ABI is 0.8, and left is 1.0.  Based on  these findings, he was referred for diagnostic coronary angiography with  abdominal aortography.   PROCEDURAL TECHNIQUE:  Informed consent was obtained.  Under 1% Lidocaine  local anesthesia, a 4 French sheath is placed in the right common femoral  arteries in a modified Seldinger technique.  Diagnostic angiography and  ventriculography were performed JL4, JR 4, and pigtail catheters.  The  pigtail catheter was then pulled back into the suprarenal abdominal aorta.  Abdominal aortography with bilateral lower extremity run-off to the mid SFA  bilaterally was performed by power injection.  This demonstrated a severe  stenosis of the right common iliac artery.  Catheter was then exchanged over  a wire for a JR4 catheter.  This was pulled back across the right common  iliac stenosis to assess resting pressure gradient.  Retrograde angiography  was then performed of the iliac stenosis from the distal portion of the  right common iliac artery.  The catheters were then removed and sheath was  removed with hemostasis obtained by manual compression.  The patient  tolerated the procedure well and was transferred to the holding room in  stable condition.   COMPLICATIONS:  None.   FINDINGS:  1.  LV:  136/10/19.  EF 65% without regional wall motion abnormal.  2.  No aortic stenosis or mitral regurgitation.  3.  Left main:  Angiographically normal.  4.  LAD:  Moderate-sized vessel giving rise to three diagonal branches.      First two diagonals are quite small and the third is larger.  The LAD      and its branches are normal.  5.  Circumflex:  Large, codominant vessel giving rise of two obtuse      marginals in the PDA.  It is angiographically normal.  6. RCA:  Moderate-      sized, codominant vessel.  It is angiographically normal.  6.  Abdominal aorta:  Normal abdominal aorta.  It gives rise to single renal      arteries bilaterally.  Both of these are normal.  8. Right leg:  Severe      common iliac stenosis with peak gradient of 40 mmHg and a mean gradient  of 15 mmHg at rest.  The remainder of the common iliac as well as the      internal iliac, external iliac, common femoral, and proximal portions of      the SFA and profunda are normal.  7.  Left leg:  There is a mild stenosis of the common iliac artery      (approximately 20%).  The remainder of the common as well as the      internal iliac, external iliac, common femoral and proximal portion of      the SFA is normal.  There is an approximately 50% stenosis of the      proximal profunda.   IMPRESSION/RECOMMENDATIONS:  1.  Angiographically normal coronary arteries with normal LV size and      systolic function.  No aortic stenosis or mitral regurgitation.  I      suspect a non-cardiac etiology to his chest pain.  Despite these      negative findings, his atherosclerotic peripheral vascular disease      places him at high risk for subsequent coronary event.  Thus, aggressive      secondary prevention efforts should  be continued.  2.  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  Stenting is recommended.       WED/MEDQ  D:  10/06/2004  T:  10/06/2004  Job:  [id number]   cc:   Milus Mallick. Lodema Hong, M.D.  [address]  [place], [place] [address]  Fax: [phone]   Pricilla Riffle, M.D.

## 2011-05-12 ENCOUNTER — Other Ambulatory Visit: Payer: Self-pay | Admitting: Family Medicine

## 2011-05-12 MED ORDER — CLOTRIMAZOLE-BETAMETHASONE 1-0.05 % EX LOTN: TOPICAL_LOTION | Freq: Two times a day (BID) | CUTANEOUS | Status: DC

## 2011-05-12 NOTE — Telephone Encounter (Signed)
Patient is out of this medication, [facility] was suppose to faxed over yesterday.

## 2011-05-12 NOTE — Telephone Encounter (Signed)
Refilled x1 

## 2011-06-12 ENCOUNTER — Encounter: Payer: Self-pay | Admitting: Family Medicine

## 2011-06-13 ENCOUNTER — Ambulatory Visit: Payer: Medicaid Other | Admitting: Family Medicine

## 2011-08-10 ENCOUNTER — Encounter: Payer: Self-pay | Admitting: Family Medicine

## 2011-08-11 ENCOUNTER — Encounter: Payer: Self-pay | Admitting: Family Medicine

## 2011-08-11 ENCOUNTER — Ambulatory Visit (INDEPENDENT_AMBULATORY_CARE_PROVIDER_SITE_OTHER): Payer: Medicaid Other | Admitting: Family Medicine

## 2011-08-11 VITALS — BP 160/80 | HR 74 | Resp 16 | Wt 200.0 lb

## 2011-08-11 DIAGNOSIS — M25521 Pain in right elbow: Secondary | ICD-10-CM | POA: Insufficient documentation

## 2011-08-11 DIAGNOSIS — F17200 Nicotine dependence, unspecified, uncomplicated: Secondary | ICD-10-CM

## 2011-08-11 DIAGNOSIS — E119 Type 2 diabetes mellitus without complications: Secondary | ICD-10-CM

## 2011-08-11 DIAGNOSIS — M25562 Pain in left knee: Secondary | ICD-10-CM

## 2011-08-11 DIAGNOSIS — M25569 Pain in unspecified knee: Secondary | ICD-10-CM

## 2011-08-11 DIAGNOSIS — E669 Obesity, unspecified: Secondary | ICD-10-CM

## 2011-08-11 DIAGNOSIS — I10 Essential (primary) hypertension: Secondary | ICD-10-CM

## 2011-08-11 DIAGNOSIS — R5381 Other malaise: Secondary | ICD-10-CM

## 2011-08-11 DIAGNOSIS — E785 Hyperlipidemia, unspecified: Secondary | ICD-10-CM

## 2011-08-11 DIAGNOSIS — R5383 Other fatigue: Secondary | ICD-10-CM

## 2011-08-11 DIAGNOSIS — M549 Dorsalgia, unspecified: Secondary | ICD-10-CM

## 2011-08-11 DIAGNOSIS — M25529 Pain in unspecified elbow: Secondary | ICD-10-CM

## 2011-08-11 DIAGNOSIS — IMO0002 Reserved for concepts with insufficient information to code with codable children: Secondary | ICD-10-CM

## 2011-08-11 DIAGNOSIS — J449 Chronic obstructive pulmonary disease, unspecified: Secondary | ICD-10-CM

## 2011-08-11 MED ORDER — BECLOMETHASONE DIPROPIONATE 40 MCG/ACT IN AERS: 2.0000 | INHALATION_SPRAY | Freq: Two times a day (BID) | RESPIRATORY_TRACT | Status: DC

## 2011-08-11 MED ORDER — ALBUTEROL SULFATE HFA 108 (90 BASE) MCG/ACT IN AERS: 2.0000 | INHALATION_SPRAY | Freq: Four times a day (QID) | RESPIRATORY_TRACT | Status: DC | PRN

## 2011-08-11 MED ORDER — KETOROLAC TROMETHAMINE 60 MG/2ML IM SOLN
60.0000 mg | Freq: Once | INTRAMUSCULAR | Status: AC
  Administered 2011-08-11: 60 mg via INTRAMUSCULAR

## 2011-08-11 MED ORDER — BENAZEPRIL-HYDROCHLOROTHIAZIDE 20-25 MG PO TABS: 1.0000 | ORAL_TABLET | Freq: Every day | ORAL | Status: DC

## 2011-08-11 NOTE — Patient Instructions (Signed)
F/U Sept 19.  Blood pressure is too high, new med sent in, pls fill.  Two inhalers sent for your breathing.  Cut back by 1 cigarete every 2 weeks  You are referred to orthopedics in [facility]  Injection today for back pain  Labs today

## 2011-08-12 NOTE — Assessment & Plan Note (Signed)
Uncontrolled and non compliant, advised to take med as prescribed to reduce cardiovascular risk

## 2011-08-12 NOTE — Assessment & Plan Note (Signed)
Deteriorated ortho eval

## 2011-08-12 NOTE — Assessment & Plan Note (Signed)
Deteriorated, will refer to ortho

## 2011-08-12 NOTE — Assessment & Plan Note (Signed)
Deteriorated, injection administered at visit

## 2011-08-12 NOTE — Assessment & Plan Note (Signed)
Diet controled, reports good numbers, will f/u on HBA1C

## 2011-08-12 NOTE — Assessment & Plan Note (Signed)
Deteriorated, inhalers prescribed, counseled to quit

## 2011-08-12 NOTE — Assessment & Plan Note (Signed)
Medication compliance addressed.Pt currently non compliant Commitment to regular exercise and healthy  food choices, with portion control discussed. DASH diet and low fat diet discussed and literature offered. Changes in medication made at this visit.

## 2011-08-12 NOTE — Assessment & Plan Note (Signed)
counselled to quit 

## 2011-08-12 NOTE — Progress Notes (Signed)
Subjective:    Patient ID: Mark Walls, male    DOB: 05/29/1956, 55 y.o.   MRN: [id number]  HPI HYPERTENSION  Disease Monitoring  Blood pressure range-unknown Chest pain- no      Dyspnea- no  Compliance- poor Lightheadedness- no   Edema- no  DIABETES  Disease Monitoring  Blood Sugar ranges-105 to 110 Polyuria- no New Visual problems- no  Compliance- diet only Hypoglycemic symptoms- no  Last eye exam: 2011      Podiatry visit: no  HYPERLIPIDEMIA  Compliance- poor RUQ pain- no  Muscle aches- yes   REGULAR EXERCISE-no      Review of Systems See HPI Denies recent fever or chills. Denies sinus pressure, nasal congestion, ear pain or sore throat.Reports increased allergy symptoms C/o  chest congestion and  productive cough x 2 weeks.Sputum and drainage is clear. Denies chest pains, palpitations and leg swelling Denies abdominal pain, nausea, vomiting,diarrhea or constipation.   Denies dysuria, frequency, hesitancy or incontinence. Chronic back pain, sees pain specialist, increased left hip pain, increased left knee pain and instability, increased right elbow pain Denies headaches, seizures, numbness, or tingling. Denies uncontrolled  depression, anxiety or insomnia. Denies skin break down or rash.        Objective:   Physical Exam  Patient alert and oriented and in no cardiopulmonary distress.  HEENT: No facial asymmetry, EOMI, no sinus tenderness,  oropharynx pink and moist.  Neck supple no adenopathy.  Chest: decreased air entry , scattered crackles, few wheezes  CVS: S1, S2 no murmurs, no S3.  ABD: Soft non tender. Bowel sounds normal.  Ext: No edema  MS: Decreased ROM spine, shoulders, hips and knees.  Skin: Intact, no ulcerations or rash noted.  Psych: Good eye contact, normal affect. Memory intact not anxious or depressed appearing.  CNS: CN 2-12 intact, power,  normal throughout.       Assessment & Plan:

## 2011-09-05 ENCOUNTER — Encounter: Payer: Self-pay | Admitting: Family Medicine

## 2011-09-06 ENCOUNTER — Ambulatory Visit: Payer: Medicaid Other | Admitting: Family Medicine

## 2011-09-08 ENCOUNTER — Encounter: Payer: Self-pay | Admitting: Family Medicine

## 2011-09-11 ENCOUNTER — Encounter: Payer: Self-pay | Admitting: Family Medicine

## 2011-09-11 ENCOUNTER — Ambulatory Visit (INDEPENDENT_AMBULATORY_CARE_PROVIDER_SITE_OTHER): Payer: Medicaid Other | Admitting: Family Medicine

## 2011-09-11 DIAGNOSIS — F17200 Nicotine dependence, unspecified, uncomplicated: Secondary | ICD-10-CM

## 2011-09-11 DIAGNOSIS — M549 Dorsalgia, unspecified: Secondary | ICD-10-CM

## 2011-09-11 DIAGNOSIS — IMO0002 Reserved for concepts with insufficient information to code with codable children: Secondary | ICD-10-CM

## 2011-09-11 DIAGNOSIS — J209 Acute bronchitis, unspecified: Secondary | ICD-10-CM

## 2011-09-11 DIAGNOSIS — E785 Hyperlipidemia, unspecified: Secondary | ICD-10-CM

## 2011-09-11 DIAGNOSIS — E119 Type 2 diabetes mellitus without complications: Secondary | ICD-10-CM

## 2011-09-11 DIAGNOSIS — I10 Essential (primary) hypertension: Secondary | ICD-10-CM

## 2011-09-11 MED ORDER — PENICILLIN V POTASSIUM 500 MG PO TABS: 500.0000 mg | ORAL_TABLET | Freq: Three times a day (TID) | ORAL | Status: AC

## 2011-09-11 MED ORDER — CEFTRIAXONE SODIUM 500 MG IJ SOLR
500.0000 mg | Freq: Once | INTRAMUSCULAR | Status: AC
  Administered 2011-09-11: 500 mg via INTRAMUSCULAR

## 2011-09-11 MED ORDER — KETOROLAC TROMETHAMINE 60 MG/2ML IM SOLN
60.0000 mg | Freq: Once | INTRAMUSCULAR | Status: AC
  Administered 2011-09-11: 60 mg via INTRAMUSCULAR

## 2011-09-11 MED ORDER — BENZONATATE 100 MG PO CAPS: 100.0000 mg | ORAL_CAPSULE | Freq: Three times a day (TID) | ORAL | Status: AC

## 2011-09-11 MED ORDER — METHYLPREDNISOLONE ACETATE 80 MG/ML IJ SUSP
80.0000 mg | Freq: Once | INTRAMUSCULAR | Status: AC
  Administered 2011-09-11: 80 mg via INTRAMUSCULAR

## 2011-09-11 NOTE — Patient Instructions (Addendum)
F/u in January.   We will call about  today's labs   You will be treated for acute bronchitis, med is sent to your pharmacy.cXR is sent  In , pls go today. Rocephin injection in office.  Injections for back pain today  hBA1C in 4 months  pls sched appt for flu vaccine in about 3 weeks.  Blood pressure is improved, cut down on salty food, and eat more vegetables a.  Continue to cut back on cigarettes gradually.  pls call Dr murphy's office for f/u on the elbow

## 2011-09-11 NOTE — Assessment & Plan Note (Signed)
Will f/u on HBA1C to determine control

## 2011-09-11 NOTE — Assessment & Plan Note (Signed)
Rocephin administered in office, and decongestants and antibiotics prescribed

## 2011-09-11 NOTE — Progress Notes (Signed)
Subjective:    Patient ID: Mark Walls, male    DOB: [birth year], 55 y.o.   MRN: [id number]  HPI 4 day h/o fever, chills and excessive cough with thick cream sputum. Denies facial pressure or sinus drainage, no sore throat or ear pain. Cutting back on cigarettes ,but still smokes 1 PPD. Tolerating blood pressure med with no adverse side effects. Pt had labs today so will f/u on this. Did see Dr Eulah Pont re elbow pain, he needs to call for results of MRI and f/u. Requsts injections today for back pain   Review of Systems See HPI  Denies chest pains, palpitations and leg swelling Denies abdominal pain, nausea, vomiting,diarrhea or constipation.   Denies dysuria, frequency, hesitancy or incontinence. Denies headaches, seizures, numbness, or tingling. Denies uncontrolled  depression, anxiety or insomnia.Followed by psychiatry Denies skin break down or rash.         Objective:   Physical Exam  Patient alert and oriented and in no cardiopulmonary distress.  HEENT: No facial asymmetry, EOMI, no sinus tenderness,  oropharynx pink and moist.  Neck supple no adenopathy.  Chest: decreased air entry bilateral crackles, no wheezes  CVS: S1, S2 no murmurs, no S3.  ABD: Soft non tender. Bowel sounds normal.  Ext: No edema  MS: Decreased  ROM spine,adequate in  shoulders, hips and knees.  Skin: Intact, no ulcerations or rash noted.  Psych: Good eye contact, normal affect. Memory intact not anxious or depressed appearing.  CNS: CN 2-12 intact, power, tone and sensation normal throughout.       Assessment & Plan:

## 2011-09-11 NOTE — Assessment & Plan Note (Signed)
Deteriorated, toradol and depomedrol administered  

## 2011-09-11 NOTE — Assessment & Plan Note (Signed)
Improved, no med change at this time though uncontrolled

## 2011-09-11 NOTE — Assessment & Plan Note (Signed)
Unchanged , cessation counseling done 

## 2011-09-12 ENCOUNTER — Telehealth: Payer: Self-pay | Admitting: *Deleted

## 2011-09-12 LAB — URINALYSIS, ROUTINE W REFLEX MICROSCOPIC
Bilirubin Urine: NEGATIVE
Glucose, UA: NEGATIVE
Ketones, ur: NEGATIVE
Nitrite: NEGATIVE
Protein, ur: NEGATIVE

## 2011-09-12 LAB — COMPLETE METABOLIC PANEL WITH GFR
ALT: 25 U/L (ref 0–53)
AST: 17 U/L (ref 0–37)
Albumin: 4.5 g/dL (ref 3.5–5.2)
Alkaline Phosphatase: 57 U/L (ref 39–117)
BUN: 17 mg/dL (ref 6–23)
Calcium: 9.9 mg/dL (ref 8.4–10.5)
Chloride: 102 mEq/L (ref 96–112)
Potassium: 5.1 mEq/L (ref 3.5–5.3)
Sodium: 139 mEq/L (ref 135–145)
Total Protein: 7.2 g/dL (ref 6.0–8.3)

## 2011-09-12 LAB — DIFFERENTIAL
Basophils Absolute: 0
Eosinophils Absolute: 0.2
Eosinophils Relative: 3
Monocytes Absolute: 0.4

## 2011-09-12 LAB — LIPID PANEL
HDL: 43 mg/dL (ref >39)
LDL Cholesterol: 143 mg/dL — ABNORMAL HIGH (ref 0–99)
Total CHOL/HDL Ratio: 5 Ratio
Triglycerides: 150 mg/dL — ABNORMAL HIGH (ref <150)
VLDL: 30 mg/dL (ref 0–40)

## 2011-09-12 LAB — COMPREHENSIVE METABOLIC PANEL
ALT: 29
AST: 19
CO2: 27
Chloride: 109
GFR calc Af Amer: >60
GFR calc non Af Amer: >60
Potassium: 4.6
Sodium: 142
Total Bilirubin: 0.7

## 2011-09-12 LAB — HEMOGLOBIN A1C: Hgb A1c MFr Bld: 6.5 % — ABNORMAL HIGH (ref <5.7)

## 2011-09-12 LAB — CBC WITH DIFFERENTIAL/PLATELET
Basophils Absolute: 0.1 10*3/uL (ref 0.0–0.1)
Basophils Relative: 1 % (ref 0–1)
HCT: 44.7 % (ref 39.0–52.0)
Lymphocytes Relative: 36 % (ref 12–46)
MCHC: 33.8 g/dL (ref 30.0–36.0)
Monocytes Absolute: 0.6 10*3/uL (ref 0.1–1.0)
Neutro Abs: 5.1 10*3/uL (ref 1.7–7.7)
Neutrophils Relative %: 57 % (ref 43–77)
RDW: 12.9 % (ref 11.5–15.5)
WBC: 9 10*3/uL (ref 4.0–10.5)

## 2011-09-12 LAB — CBC
RBC: 4.04 — ABNORMAL LOW
WBC: 6.6

## 2011-09-12 NOTE — Telephone Encounter (Signed)
A1C was just done, result with other labs

## 2011-09-12 NOTE — Telephone Encounter (Signed)
Message copied by Diamantina Monks on Tue Sep 12, 2011  2:13 PM ------      Message from: Syliva Overman MD E      Created: Tue Sep 12, 2011 12:09 PM       pls add HBA1C last on record over 4 months ago.            Let pt know cholesterol is better, pls take med every day as prescribed and reduce fried and fatty foods and red meats

## 2011-09-12 NOTE — Telephone Encounter (Signed)
thanks

## 2011-09-13 ENCOUNTER — Telehealth: Payer: Self-pay | Admitting: *Deleted

## 2011-09-13 NOTE — Telephone Encounter (Signed)
CALLED PATIENT, NO ANSWER °

## 2011-09-13 NOTE — Telephone Encounter (Signed)
Message copied by Diamantina Monks on Wed Sep 13, 2011 10:22 AM ------      Message from: Syliva Overman MD E      Created: Tue Sep 12, 2011 12:09 PM       pls add HBA1C last on record over 4 months ago.            Let pt know cholesterol is better, pls take med every day as prescribed and reduce fried and fatty foods and red meats

## 2011-09-14 ENCOUNTER — Telehealth: Payer: Self-pay | Admitting: *Deleted

## 2011-09-14 NOTE — Telephone Encounter (Signed)
Message copied by Diamantina Monks on Thu Sep 14, 2011  3:19 PM ------      Message from: Syliva Overman MD E      Created: Tue Sep 12, 2011 12:09 PM       pls add HBA1C last on record over 4 months ago.            Let pt know cholesterol is better, pls take med every day as prescribed and reduce fried and fatty foods and red meats

## 2011-09-14 NOTE — Telephone Encounter (Signed)
Called patient, no answer 

## 2011-09-16 ENCOUNTER — Telehealth: Payer: Self-pay | Admitting: *Deleted

## 2011-09-16 NOTE — Telephone Encounter (Signed)
Patient aware of lab results.

## 2011-09-16 NOTE — Telephone Encounter (Signed)
Patient aware.

## 2011-09-16 NOTE — Telephone Encounter (Signed)
Message copied by Diamantina Monks on Sat Sep 16, 2011  2:20 PM ------      Message from: Syliva Overman MD E      Created: Tue Sep 12, 2011 12:09 PM       pls add HBA1C last on record over 4 months ago.            Let pt know cholesterol is better, pls take med every day as prescribed and reduce fried and fatty foods and red meats

## 2011-11-21 ENCOUNTER — Telehealth: Payer: Self-pay | Admitting: Family Medicine

## 2011-11-21 NOTE — Telephone Encounter (Signed)
Advise he never got the CXR which was ordered when he was seen 3 months ago, will need office re -eval before any antibiotic, but I suggest he gets the CXR, his cough is likely from ongoing cigarettes which he needs to quit

## 2011-11-21 NOTE — Telephone Encounter (Signed)
States he is having surgery on Monday and still has the bronchitis and cough. The penicillin didn't knock it out. Wants something else sent so he can have his surgery as scheduled.

## 2011-11-22 ENCOUNTER — Encounter: Payer: Self-pay | Admitting: Family Medicine

## 2011-11-22 ENCOUNTER — Ambulatory Visit (HOSPITAL_COMMUNITY)
Admission: RE | Admit: 2011-11-22 | Discharge: 2011-11-22 | Disposition: A | Discharge status: Home / Self Care | Payer: Medicaid Other | Source: Ambulatory Visit | Attending: Family Medicine | Admitting: Family Medicine

## 2011-11-22 DIAGNOSIS — J40 Bronchitis, not specified as acute or chronic: Secondary | ICD-10-CM | POA: Insufficient documentation

## 2011-11-22 DIAGNOSIS — J209 Acute bronchitis, unspecified: Secondary | ICD-10-CM

## 2011-11-22 DIAGNOSIS — R0602 Shortness of breath: Secondary | ICD-10-CM | POA: Insufficient documentation

## 2011-11-22 DIAGNOSIS — R079 Chest pain, unspecified: Secondary | ICD-10-CM | POA: Insufficient documentation

## 2011-11-22 NOTE — Telephone Encounter (Signed)
Called pt, no option to leave message  

## 2011-11-22 NOTE — Telephone Encounter (Signed)
Pt aware and is scheduling with sallie

## 2011-11-23 ENCOUNTER — Encounter: Payer: Self-pay | Admitting: Family Medicine

## 2011-11-23 ENCOUNTER — Ambulatory Visit (INDEPENDENT_AMBULATORY_CARE_PROVIDER_SITE_OTHER): Payer: Medicaid Other | Admitting: Family Medicine

## 2011-11-23 VITALS — BP 140/74 | HR 73 | Resp 18 | Ht 67.0 in | Wt 199.0 lb

## 2011-11-23 DIAGNOSIS — F17200 Nicotine dependence, unspecified, uncomplicated: Secondary | ICD-10-CM

## 2011-11-23 DIAGNOSIS — E119 Type 2 diabetes mellitus without complications: Secondary | ICD-10-CM

## 2011-11-23 DIAGNOSIS — J449 Chronic obstructive pulmonary disease, unspecified: Secondary | ICD-10-CM

## 2011-11-23 DIAGNOSIS — I10 Essential (primary) hypertension: Secondary | ICD-10-CM

## 2011-11-23 DIAGNOSIS — E785 Hyperlipidemia, unspecified: Secondary | ICD-10-CM

## 2011-11-23 DIAGNOSIS — J42 Unspecified chronic bronchitis: Secondary | ICD-10-CM | POA: Insufficient documentation

## 2011-11-23 MED ORDER — PROMETHAZINE-DM 6.25-15 MG/5ML PO SYRP: ORAL_SOLUTION | ORAL | Status: DC

## 2011-11-23 MED ORDER — AZITHROMYCIN 250 MG PO TABS: ORAL_TABLET | ORAL | Status: AC

## 2011-11-23 MED ORDER — BUDESONIDE-FORMOTEROL FUMARATE 80-4.5 MCG/ACT IN AERO: 2.0000 | INHALATION_SPRAY | Freq: Two times a day (BID) | RESPIRATORY_TRACT | Status: DC

## 2011-11-23 NOTE — Progress Notes (Signed)
Subjective:    Patient ID: Mark Walls, male    DOB: Jul 01, 1956, 55 y.o.   MRN: [id number]  HPI  The PT is here for follow up and re-evaluation of chronic medical conditions, medication management and review of any available recent lab and radiology data.  Preventive health is updated, specifically  Cancer screening and Immunization.   Questions or concerns regarding consultations or procedures which the PT has had in the interim are  Addressed.He has upcoming right arm surgery and is very concerned about his chronic cough with sputum. Recent cXr is clear, understands that his cough is essentially due to nicotine and he needs to quit The PT denies any adverse reactions to current medications since the last visit.       Review of Systems See HPI Denies recent fever or chills. Denies sinus pressure, nasal congestion, ear pain or sore throat.  Denies chest pains, palpitations and leg swelling Denies abdominal pain, nausea, vomiting,diarrhea or constipation.   Denies dysuria, frequency, hesitancy or incontinence. Chronic back pain unchanged Denies headaches, seizures, numbness, or tingling. Denies depression, anxiety or insomnia. Denies skin break down or rash.        Objective:   Physical Exam  Patient alert and oriented and in no cardiopulmonary distress.  HEENT: No facial asymmetry, EOMI, no sinus tenderness,  oropharynx pink and moist.  Neck supple no adenopathy.  Chest: decreased air entry, scattered crackles, few wheezes  CVS: S1, S2 no murmurs, no S3.  ABD: Soft non tender. Bowel sounds normal.  Ext: No edema  MS: Adequate though reduced ROM spine, shoulders, hips and knees.  Skin: Intact, no ulcerations or rash noted.  Psych: Good eye contact, normal affect. Memory intact not anxious or depressed appearing.  CNS: CN 2-12 intact, power, tone and sensation normal throughout.       Assessment & Plan:

## 2011-11-23 NOTE — Patient Instructions (Addendum)
F/U in mid January. 3 new medications are sent in for chronic bronchitis.  YOU NEED TO STOP SMOKING  Fasting lipid, cmp , eGFR, HBA1C 55 yo 7 days before next visit  All the best with your surgery

## 2011-11-24 ENCOUNTER — Telehealth: Payer: Self-pay

## 2011-11-24 NOTE — Telephone Encounter (Signed)
Advise the 80mg  tab break in half and send in new dose of 40mg  for 6 months total Tell him it is espescialy impt he change his diet so cholesterol falls despite being on the lower dose, it was too high and this is increasing his risk of heart disease and stroke

## 2011-11-24 NOTE — Telephone Encounter (Signed)
Wants to go back to Pravastatin 40mg . He is currently on 80mg  tab. It has been making him nauseated and weak

## 2011-11-25 NOTE — Assessment & Plan Note (Signed)
Improved though still uncontrolled, low fat diet discussed and encouraged, rept labs prior to next visit. Med compliance also stressed

## 2011-11-25 NOTE — Assessment & Plan Note (Signed)
Pt needs to stop smoking. Has increased anxiety re chronic cough with upcoming surgery, will prescribe z pack due to higher risk of complication associated with diabetes and ongoing nicotine use

## 2011-11-25 NOTE — Assessment & Plan Note (Signed)
Controlled, no change in medication  

## 2011-11-25 NOTE — Assessment & Plan Note (Signed)
Sub optimal control, no med change at this visit however, reduced sodium intake and regular physical activity stressed

## 2011-11-25 NOTE — Assessment & Plan Note (Signed)
Slowly reducing nicotine use , however the need to quit is again stressed, seems interested in comiting to this

## 2011-11-27 MED ORDER — PRAVASTATIN SODIUM 40 MG PO TABS: 40.0000 mg | ORAL_TABLET | Freq: Every evening | ORAL | Status: DC

## 2011-11-27 NOTE — Telephone Encounter (Signed)
Sent in and patient aware 

## 2012-01-05 ENCOUNTER — Encounter: Payer: Self-pay | Admitting: Family Medicine

## 2012-01-09 ENCOUNTER — Ambulatory Visit: Payer: Medicaid Other | Admitting: Family Medicine

## 2012-01-09 ENCOUNTER — Encounter: Payer: Self-pay | Admitting: Family Medicine

## 2012-01-31 ENCOUNTER — Ambulatory Visit: Payer: Medicaid Other | Attending: Orthopedic Surgery | Admitting: Occupational Therapy

## 2012-01-31 ENCOUNTER — Encounter: Payer: Self-pay | Admitting: Family Medicine

## 2012-01-31 ENCOUNTER — Ambulatory Visit: Payer: Medicaid Other | Admitting: Physical Therapy

## 2012-01-31 ENCOUNTER — Ambulatory Visit (INDEPENDENT_AMBULATORY_CARE_PROVIDER_SITE_OTHER): Payer: Medicaid Other | Admitting: Family Medicine

## 2012-01-31 VITALS — BP 160/74 | HR 97 | Resp 16 | Ht 67.0 in | Wt 205.8 lb

## 2012-01-31 DIAGNOSIS — R5383 Other fatigue: Secondary | ICD-10-CM

## 2012-01-31 DIAGNOSIS — J4489 Other specified chronic obstructive pulmonary disease: Secondary | ICD-10-CM

## 2012-01-31 DIAGNOSIS — I10 Essential (primary) hypertension: Secondary | ICD-10-CM

## 2012-01-31 DIAGNOSIS — J449 Chronic obstructive pulmonary disease, unspecified: Secondary | ICD-10-CM

## 2012-01-31 DIAGNOSIS — F17200 Nicotine dependence, unspecified, uncomplicated: Secondary | ICD-10-CM

## 2012-01-31 DIAGNOSIS — Z125 Encounter for screening for malignant neoplasm of prostate: Secondary | ICD-10-CM

## 2012-01-31 DIAGNOSIS — IMO0002 Reserved for concepts with insufficient information to code with codable children: Secondary | ICD-10-CM

## 2012-01-31 DIAGNOSIS — F329 Major depressive disorder, single episode, unspecified: Secondary | ICD-10-CM

## 2012-01-31 DIAGNOSIS — IMO0001 Reserved for inherently not codable concepts without codable children: Secondary | ICD-10-CM | POA: Insufficient documentation

## 2012-01-31 DIAGNOSIS — M25549 Pain in joints of unspecified hand: Secondary | ICD-10-CM | POA: Insufficient documentation

## 2012-01-31 DIAGNOSIS — E785 Hyperlipidemia, unspecified: Secondary | ICD-10-CM

## 2012-01-31 DIAGNOSIS — E119 Type 2 diabetes mellitus without complications: Secondary | ICD-10-CM

## 2012-01-31 DIAGNOSIS — M25649 Stiffness of unspecified hand, not elsewhere classified: Secondary | ICD-10-CM | POA: Insufficient documentation

## 2012-01-31 DIAGNOSIS — M549 Dorsalgia, unspecified: Secondary | ICD-10-CM

## 2012-01-31 DIAGNOSIS — R5381 Other malaise: Secondary | ICD-10-CM

## 2012-01-31 DIAGNOSIS — F3289 Other specified depressive episodes: Secondary | ICD-10-CM
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
## 2012-01-31 NOTE — Assessment & Plan Note (Signed)
Diet controlled only, check HBA1C

## 2012-01-31 NOTE — Assessment & Plan Note (Signed)
Elevated at this time , will hold on med change as has been much better in the recent past
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
## 2012-01-31 NOTE — Assessment & Plan Note (Addendum)
Inadequately treated , will discuss with his psychiatrist

## 2012-02-01 MED ORDER — KETOROLAC TROMETHAMINE 60 MG/2ML IM SOLN
60.0000 mg | Freq: Once | INTRAMUSCULAR | Status: AC
  Administered 2012-02-01: 60 mg via INTRAMUSCULAR

## 2012-02-01 MED ORDER — METHYLPREDNISOLONE ACETATE PF 80 MG/ML IJ SUSP
80.0000 mg | Freq: Once | INTRAMUSCULAR | Status: AC
  Administered 2012-02-01: 80 mg via INTRAMUSCULAR
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
## 2012-02-03 NOTE — Assessment & Plan Note (Signed)
Intends to quit

## 2012-02-03 NOTE — Assessment & Plan Note (Signed)
Pt plans to quit smoking, he has severe copd

## 2012-02-14 ENCOUNTER — Encounter: Payer: Medicaid Other | Admitting: Occupational Therapy

## 2012-04-11 ENCOUNTER — Ambulatory Visit: Payer: Medicaid Other | Admitting: Family Medicine

## 2012-04-15 ENCOUNTER — Encounter: Payer: Self-pay | Admitting: Family Medicine

## 2012-04-15 ENCOUNTER — Ambulatory Visit (INDEPENDENT_AMBULATORY_CARE_PROVIDER_SITE_OTHER): Payer: Medicaid Other | Admitting: Family Medicine

## 2012-04-15 VITALS — BP 110/70 | HR 64 | Resp 18 | Ht 67.0 in | Wt 205.1 lb

## 2012-04-15 DIAGNOSIS — M549 Dorsalgia, unspecified: Secondary | ICD-10-CM

## 2012-04-15 DIAGNOSIS — E785 Hyperlipidemia, unspecified: Secondary | ICD-10-CM

## 2012-04-15 DIAGNOSIS — F17200 Nicotine dependence, unspecified, uncomplicated: Secondary | ICD-10-CM

## 2012-04-15 DIAGNOSIS — IMO0002 Reserved for concepts with insufficient information to code with codable children: Secondary | ICD-10-CM

## 2012-04-15 DIAGNOSIS — J449 Chronic obstructive pulmonary disease, unspecified: Secondary | ICD-10-CM

## 2012-04-15 DIAGNOSIS — I10 Essential (primary) hypertension: Secondary | ICD-10-CM

## 2012-04-15 DIAGNOSIS — E119 Type 2 diabetes mellitus without complications: Secondary | ICD-10-CM

## 2012-04-15 DIAGNOSIS — F329 Major depressive disorder, single episode, unspecified: Secondary | ICD-10-CM

## 2012-04-15 MED ORDER — KETOROLAC TROMETHAMINE 60 MG/2ML IM SOLN
60.0000 mg | Freq: Once | INTRAMUSCULAR | Status: AC
  Administered 2012-04-15: 60 mg via INTRAMUSCULAR

## 2012-04-15 NOTE — Assessment & Plan Note (Signed)
Uncontrolled, not suicidal or homicidal but reports excessive fatigue and days when he barely gets out of bed, treated by psych and I advised he call for sooner re eval

## 2012-04-15 NOTE — Assessment & Plan Note (Signed)
Deteriorating due to ongoing nicotine use ?

## 2012-04-15 NOTE — Assessment & Plan Note (Signed)
Controlled, no change in medication  

## 2012-04-15 NOTE — Progress Notes (Signed)
Subjective:    Patient ID: Mark Walls, male    DOB: Jan 19, 1956, 56 y.o.   MRN: [id number]  HPI The PT is here for follow up and re-evaluation of chronic medical conditions, medication management and review of any available recent lab and radiology data.  Preventive health is updated, specifically  Cancer screening and Immunization.   Questions or concerns regarding consultations or procedures which the PT has had in the interim are  Addressed.c/o inability to get new hand braces as well as reduction in dose of pain medication with uncontrolled back pain C/o worsening in his depression and fatigue, not suicidal or homicidal. On no medication for diabetes, denies polyuria, polydipsia or blurred vision     Review of Systems See HPI Denies recent fever or chills. Denies sinus pressure, nasal congestion, ear pain or sore throat. Denies chest congestion, productive cough or wheezing. Denies chest pains, palpitations and leg swelling Denies abdominal pain, nausea, vomiting,diarrhea or constipation.   Denies dysuria, frequency, hesitancy or incontinence.  Denies headaches, seizures, numbness, or tingling. Denies skin break down or rash.        Objective:   Physical Exam  Patient alert and oriented and in no cardiopulmonary distress.  HEENT: No facial asymmetry, EOMI, no sinus tenderness,  oropharynx pink and moist.  Neck supple no adenopathy.  Chest: Clear to auscultation bilaterally.decreased air entry throughout  CVS: S1, S2 no murmurs, no S3.  ABD: Soft non tender. Bowel sounds normal.  Ext: No edema  MS: decreased  ROM spine, shoulders, hips and knees.  Skin: Intact, no ulcerations or rash noted.  Psych: Good eye contact, normal affect. Memory intact not anxious or depressed appearing.  CNS: CN 2-12 intact, power, tone and sensation normal throughout.       Assessment & Plan:

## 2012-04-15 NOTE — Assessment & Plan Note (Signed)
unchanged

## 2012-04-15 NOTE — Patient Instructions (Addendum)
F/u in 3.5 month  I will send a note to Dr Gerilyn Pilgrim regarding your concerns with pain med  You need to quit smoking  I suggest you call for a sooner appointment with the psychiatrist since you report uncontrolled depression  toradol 60mg  in the office for back pain  hBA1c and cmp and egfr non fasting in 3.5 month
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
## 2012-04-15 NOTE — Assessment & Plan Note (Signed)
Diet controled only at this time ,updated lab needed, same done today, will contact pt with the result

## 2012-04-15 NOTE — Assessment & Plan Note (Signed)
Hyperlipidemia:Low fat diet discussed and encouraged.  Uncontrolled when last checked, updated lab data today

## 2012-04-16 LAB — CBC WITH DIFFERENTIAL/PLATELET
Eosinophils Absolute: 0.3 10*3/uL (ref 0.0–0.7)
Eosinophils Relative: 3 % (ref 0–5)
HCT: 45.1 % (ref 39.0–52.0)
Hemoglobin: 14.9 g/dL (ref 13.0–17.0)
Lymphs Abs: 2.7 10*3/uL (ref 0.7–4.0)
MCH: 31.4 pg (ref 26.0–34.0)
MCV: 95.1 fL (ref 78.0–100.0)
Monocytes Absolute: 0.5 10*3/uL (ref 0.1–1.0)
Monocytes Relative: 6 % (ref 3–12)
RBC: 4.74 MIL/uL (ref 4.22–5.81)

## 2012-04-16 LAB — COMPLETE METABOLIC PANEL WITH GFR
Albumin: 4.3 g/dL (ref 3.5–5.2)
Alkaline Phosphatase: 65 U/L (ref 39–117)
GFR, Est Non African American: >89 mL/min
Glucose, Bld: 123 mg/dL — ABNORMAL HIGH (ref 70–99)
Potassium: 4.8 mEq/L (ref 3.5–5.3)
Sodium: 140 mEq/L (ref 135–145)
Total Protein: 6.7 g/dL (ref 6.0–8.3)

## 2012-04-16 LAB — LIPID PANEL: Total CHOL/HDL Ratio: 6 Ratio

## 2012-04-18 ENCOUNTER — Other Ambulatory Visit: Payer: Self-pay | Admitting: Family Medicine

## 2012-05-14 ENCOUNTER — Ambulatory Visit: Payer: Medicaid Other | Admitting: Family Medicine

## 2012-06-07 ENCOUNTER — Other Ambulatory Visit: Payer: Self-pay

## 2012-06-07 MED ORDER — AMLODIPINE BESYLATE 10 MG PO TABS: 10.0000 mg | ORAL_TABLET | Freq: Every day | ORAL | Status: DC

## 2012-06-13 ENCOUNTER — Ambulatory Visit: Payer: Medicaid Other | Attending: Orthopedic Surgery | Admitting: Occupational Therapy

## 2012-07-04 ENCOUNTER — Encounter: Payer: Medicaid Other | Admitting: Occupational Therapy

## 2012-07-22 ENCOUNTER — Ambulatory Visit: Payer: Medicaid Other | Admitting: Family Medicine

## 2012-08-13 ENCOUNTER — Telehealth: Payer: Self-pay | Admitting: Family Medicine

## 2012-08-14 NOTE — Telephone Encounter (Signed)
Called and offered patient an appointment.  He stated that he did not need to make one that he already has one scheduled.

## 2012-08-14 NOTE — Telephone Encounter (Signed)
Patient first stated that he has a "kidney infection" and then he goes on to say that he needs diflucan sent in for a yeast infection.  Will call patient back to schedule an office visit.

## 2012-09-03 ENCOUNTER — Encounter: Payer: Self-pay | Admitting: Family Medicine

## 2012-09-03 ENCOUNTER — Ambulatory Visit (INDEPENDENT_AMBULATORY_CARE_PROVIDER_SITE_OTHER): Payer: Medicaid Other | Admitting: Family Medicine

## 2012-09-03 VITALS — BP 154/86 | HR 69 | Resp 18 | Ht 67.0 in | Wt 210.0 lb

## 2012-09-03 DIAGNOSIS — I10 Essential (primary) hypertension: Secondary | ICD-10-CM

## 2012-09-03 DIAGNOSIS — E119 Type 2 diabetes mellitus without complications: Secondary | ICD-10-CM

## 2012-09-03 DIAGNOSIS — Z1211 Encounter for screening for malignant neoplasm of colon: Secondary | ICD-10-CM

## 2012-09-03 DIAGNOSIS — F329 Major depressive disorder, single episode, unspecified: Secondary | ICD-10-CM

## 2012-09-03 DIAGNOSIS — F17200 Nicotine dependence, unspecified, uncomplicated: Secondary | ICD-10-CM

## 2012-09-03 DIAGNOSIS — I739 Peripheral vascular disease, unspecified: Secondary | ICD-10-CM

## 2012-09-03 DIAGNOSIS — Z23 Encounter for immunization: Secondary | ICD-10-CM

## 2012-09-03 DIAGNOSIS — IMO0002 Reserved for concepts with insufficient information to code with codable children: Secondary | ICD-10-CM

## 2012-09-03 DIAGNOSIS — E785 Hyperlipidemia, unspecified: Secondary | ICD-10-CM

## 2012-09-03 MED ORDER — METHYLPREDNISOLONE ACETATE 80 MG/ML IJ SUSP
80.0000 mg | Freq: Once | INTRAMUSCULAR | Status: AC
  Administered 2012-09-03: 80 mg via INTRAMUSCULAR

## 2012-09-03 MED ORDER — CLONIDINE HCL 0.2 MG PO TABS: ORAL_TABLET | ORAL | Status: DC

## 2012-09-03 MED ORDER — KETOROLAC TROMETHAMINE 60 MG/2ML IJ SOLN
60.0000 mg | Freq: Once | INTRAMUSCULAR | Status: AC
  Administered 2012-09-03: 60 mg via INTRAMUSCULAR

## 2012-09-03 NOTE — Patient Instructions (Addendum)
F/u in early December.  Fasting lipid, cmp, hBa1C vit D end November  You need to continue to cut back on smoking and stop smoking  Toradol 60mg  IM and depo medrol 80mg  Im in office for back pain. You are referrd to new pain clinic per your request. mRI of your low back is ordered due to increased and uncontrolled pain   Blood pressure is high, new additional medication to be started at bedtime , clonidine 0.2mg  one at bedtime  You are referred for re evaluation of circulation in your legs   Flu vaccine today

## 2012-09-04 ENCOUNTER — Encounter (INDEPENDENT_AMBULATORY_CARE_PROVIDER_SITE_OTHER): Payer: Self-pay | Admitting: *Deleted

## 2012-09-10 ENCOUNTER — Ambulatory Visit (HOSPITAL_COMMUNITY): Payer: Medicaid Other

## 2012-09-12 ENCOUNTER — Ambulatory Visit (HOSPITAL_COMMUNITY)
Admission: RE | Admit: 2012-09-12 | Discharge: 2012-09-12 | Disposition: A | Discharge status: Home / Self Care | Payer: Medicaid Other | Source: Ambulatory Visit | Attending: Family Medicine | Admitting: Family Medicine

## 2012-09-12 DIAGNOSIS — M545 Low back pain, unspecified: Secondary | ICD-10-CM | POA: Insufficient documentation

## 2012-09-12 DIAGNOSIS — M5137 Other intervertebral disc degeneration, lumbosacral region: Secondary | ICD-10-CM | POA: Insufficient documentation

## 2012-09-12 DIAGNOSIS — M51379 Other intervertebral disc degeneration, lumbosacral region without mention of lumbar back pain or lower extremity pain: Secondary | ICD-10-CM | POA: Insufficient documentation

## 2012-09-12 DIAGNOSIS — IMO0002 Reserved for concepts with insufficient information to code with codable children: Secondary | ICD-10-CM

## 2012-09-12 DIAGNOSIS — R209 Unspecified disturbances of skin sensation: Secondary | ICD-10-CM | POA: Insufficient documentation

## 2012-09-23 NOTE — Assessment & Plan Note (Addendum)
Increased and uncontrolled. Pt requests evaluation at new pain clinic , referral is being done  toradol an depo medrol in office

## 2012-09-23 NOTE — Progress Notes (Signed)
Subjective:    Patient ID: Mark Walls, male    DOB: [birth year], 56 y.o.   MRN: [id number]  HPI The PT is here for follow up and re-evaluation of chronic medical conditions, medication management and review of any available recent lab and radiology data.  Preventive health is updated, specifically  Cancer screening and Immunization.   Questions or concerns regarding consultations or procedures which the PT has had in the interim are  Addressed.Unhappy with current pain management, and wants to change to a new facility The PT denies any adverse reactions to current medications since the last visit.  There are no new concerns.      Review of Systems See HPI Denies recent fever or chills. Denies sinus pressure, nasal congestion, ear pain or sore throat. Denies chest congestion, productive cough or wheezing. Denies chest pains, palpitations and leg swelling Denies abdominal pain, nausea, vomiting,diarrhea or constipation.   Denies dysuria, frequency, hesitancy or incontinence.  Denies headaches, seizures, numbness, or tingling. C/o depression, anxiety and  Insomnia.not suicidal or homicidal, followed by psych Denies skin break down or rash.        Objective:   Physical Exam Patient alert and oriented and in no cardiopulmonary distress.  HEENT: No facial asymmetry, EOMI, no sinus tenderness,  oropharynx pink and moist.  Neck supple no adenopathy.  Chest: Clear to auscultation bilaterally.Decreased air entry throughout  CVS: S1, S2 no murmurs, no S3.Decreased DP bilaterally  ABD: Soft non tender. Bowel sounds normal.  Ext: No edema  PP:IRJJOACZY  ROM spine, shoulders, hips and knees.  Skin: Intact, no ulcerations or rash noted.  Psych: Good eye contact, normal affect. Memory intact not anxious , mildly depressed appearing.  CNS: CN 2-12 intact, power, tone and sensation normal throughout.        Assessment & Plan:

## 2012-09-23 NOTE — Assessment & Plan Note (Signed)
managed by diet only at this time

## 2012-09-23 NOTE — Assessment & Plan Note (Signed)
Uncontrolled, dose increase in med DASH diet and commitment to daily physical activity for a minimum of 30 minutes discussed and encouraged, as a part of hypertension management. The importance of attaining a healthy weight is also discussed.  

## 2012-09-23 NOTE — Assessment & Plan Note (Signed)
Decreased DP, refer for ABI testing

## 2012-09-23 NOTE — Assessment & Plan Note (Signed)
unchanged Patient counseled for approximately 5 minutes regarding the health risks of ongoing nicotine use, specifically all types of cancer, heart disease, stroke and respiratory failure. The options available for help with cessation ,the behavioral changes to assist the process, and the option to either gradully reduce usage  Or abruptly stop.is also discussed. Pt is also encouraged to set specific goals in number of cigarettes used daily, as well as to set a quit date.  

## 2012-09-23 NOTE — Assessment & Plan Note (Signed)
Unchanged, not suicidal or homicidal. Treated by psych, level of daily function remains low, consumed by chronic pain

## 2012-09-23 NOTE — Assessment & Plan Note (Signed)
Hyperlipidemia:Low fat diet discussed and encouraged.  Updated lab needed before next visit

## 2012-10-22 ENCOUNTER — Telehealth: Payer: Self-pay | Admitting: Family Medicine

## 2012-10-23 ENCOUNTER — Telehealth: Payer: Self-pay | Admitting: Family Medicine

## 2012-10-23 NOTE — Telephone Encounter (Signed)
This message has already been sent to Dr for review

## 2012-10-23 NOTE — Telephone Encounter (Signed)
He will have to wait on new pain doc to prescribe his pain meds

## 2012-10-24 NOTE — Telephone Encounter (Signed)
Patient aware.

## 2012-10-25 ENCOUNTER — Telehealth: Payer: Self-pay

## 2012-10-25 ENCOUNTER — Other Ambulatory Visit: Payer: Self-pay | Admitting: Family Medicine

## 2012-10-25 ENCOUNTER — Telehealth: Payer: Self-pay | Admitting: Family Medicine

## 2012-10-25 MED ORDER — NABUMETONE 750 MG PO TABS: 750.0000 mg | ORAL_TABLET | Freq: Two times a day (BID) | ORAL | Status: DC

## 2012-10-25 MED ORDER — DULOXETINE HCL 60 MG PO CPEP: 60.0000 mg | ORAL_CAPSULE | Freq: Every day | ORAL | Status: DC

## 2012-10-25 NOTE — Telephone Encounter (Signed)
pls see message which i opened, explaining what to tell pt and the meds I would send, thanks

## 2012-10-25 NOTE — Telephone Encounter (Signed)
Please let pt know I have reviewed record of medications from pharmacy , I will prescribe cymbalta and nabumetone for 2 months till he sees Dr Laurian Brim. I will not prescribe fentanyl patch , if he asks for this, there is no recent record of this being prescribed by dr Gerilyn Pilgrim. Dr Omelia Blackwater to continue the xanax and if he gets aderrall also from him then call him for this. No record that it is from

## 2012-10-25 NOTE — Telephone Encounter (Signed)
Called an requested print out of meds collected for this year.

## 2012-10-25 NOTE — Telephone Encounter (Signed)
Patient aware.

## 2012-10-25 NOTE — Telephone Encounter (Signed)
pls check the pharmacy and ask them to send printout for past 3 month, pls review and highlight the fentanyl , cymbalta and aderrall scripts, and let me see this please hand it to me, thanks

## 2012-10-25 NOTE — Telephone Encounter (Signed)
Noted  

## 2012-10-28 ENCOUNTER — Telehealth: Payer: Self-pay | Admitting: Family Medicine

## 2012-10-28 MED ORDER — HYDROCODONE-APAP-DIETARY PROD 10-650 MG PO MISC: 1.0000 | Freq: Two times a day (BID) | ORAL | Status: DC

## 2012-10-28 NOTE — Telephone Encounter (Signed)
Pt brought in bottle of most recent hydrocodone from Dr Gerilyn Pilgrim, last filled 70 tabs on 10/03. Reduced dose of medication to twixce daily printed and sent to his pharmacy

## 2012-10-28 NOTE — Telephone Encounter (Signed)
Elesa Hacker writes on Tue Oct 22, 2012 1457 from [place] [facility] [place] [[id number]]: Expire       received papers today from Dr. Harriett Sine office and faxed to Dr. Laurian Brim left message for patient to call back   Elesa Hacker writes on Mon Oct 28, 2012 1434 from [place] [facility] [place] [[id number]]: Expire       patient has spoke with Dr. Rolan Bucco office and stated that it could take up 2 months

## 2012-12-10 ENCOUNTER — Other Ambulatory Visit: Payer: Self-pay | Admitting: Family Medicine

## 2012-12-10 ENCOUNTER — Ambulatory Visit (INDEPENDENT_AMBULATORY_CARE_PROVIDER_SITE_OTHER): Payer: Medicaid Other | Admitting: Family Medicine

## 2012-12-10 ENCOUNTER — Encounter: Payer: Self-pay | Admitting: Family Medicine

## 2012-12-10 VITALS — BP 134/66 | HR 68 | Resp 18 | Ht 67.0 in | Wt 211.0 lb

## 2012-12-10 DIAGNOSIS — F17200 Nicotine dependence, unspecified, uncomplicated: Secondary | ICD-10-CM

## 2012-12-10 DIAGNOSIS — IMO0002 Reserved for concepts with insufficient information to code with codable children: Secondary | ICD-10-CM

## 2012-12-10 DIAGNOSIS — J209 Acute bronchitis, unspecified: Secondary | ICD-10-CM

## 2012-12-10 DIAGNOSIS — I10 Essential (primary) hypertension: Secondary | ICD-10-CM

## 2012-12-10 DIAGNOSIS — R748 Abnormal levels of other serum enzymes: Secondary | ICD-10-CM

## 2012-12-10 DIAGNOSIS — E119 Type 2 diabetes mellitus without complications: Secondary | ICD-10-CM

## 2012-12-10 DIAGNOSIS — E785 Hyperlipidemia, unspecified: Secondary | ICD-10-CM

## 2012-12-10 DIAGNOSIS — M549 Dorsalgia, unspecified: Secondary | ICD-10-CM

## 2012-12-10 MED ORDER — METHYLPREDNISOLONE ACETATE 80 MG/ML IJ SUSP
80.0000 mg | Freq: Once | INTRAMUSCULAR | Status: AC
  Administered 2012-12-10: 80 mg via INTRAMUSCULAR

## 2012-12-10 MED ORDER — BENZONATATE 100 MG PO CAPS: 100.0000 mg | ORAL_CAPSULE | Freq: Four times a day (QID) | ORAL | Status: DC | PRN

## 2012-12-10 MED ORDER — KETOROLAC TROMETHAMINE 60 MG/2ML IJ SOLN
60.0000 mg | Freq: Once | INTRAMUSCULAR | Status: AC
  Administered 2012-12-10: 60 mg via INTRAMUSCULAR

## 2012-12-10 MED ORDER — PENICILLIN V POTASSIUM 500 MG PO TABS: 500.0000 mg | ORAL_TABLET | Freq: Three times a day (TID) | ORAL | Status: DC

## 2012-12-10 NOTE — Patient Instructions (Addendum)
F/U in 4.5 month, call if you need me before.  Congrats on smoking cessation!  Medication sent in for bronchitis, with 2 week h/o cough  Toradol and depomedrol today for back and lower extremity pain.   Labs today, lipid, cmp and EGFr, hBA1C

## 2012-12-10 NOTE — Progress Notes (Signed)
Subjective:    Patient ID: Mark Walls, male    DOB: 15-Nov-1956, 56 y.o.   MRN: [id number]  HPI The PT is here for follow up and re-evaluation of chronic medical conditions, medication management and review of any available recent lab and radiology data.  Preventive health is updated, specifically  Cancer screening and Immunization.   Still awaiting acceptance to new pain clinic he requested. 1 month h/o cough and chest congestion, sputum is yellow, intermittent chills, no documented fever C/o uncontrolled back pain requests shots in the office which have helped in the past    Review of Systems See HPI  Denies sinus pressure, nasal congestion, ear pain or sore throat. Denies chest pains, palpitations and leg swelling Denies abdominal pain, nausea, vomiting,diarrhea or constipation.   Denies dysuria, frequency, hesitancy or incontinence.  Denies headaches, seizures, numbness, or tingling. Denies uncontrolled  depression, anxiety or insomnia. Denies skin break down or rash.        Objective:   Physical Exam Patient alert and oriented and in no cardiopulmonary distress.  HEENT: No facial asymmetry, EOMI, no sinus tenderness,  oropharynx pink and moist.  Neck supple no adenopathy.  Chest: decreased air entry, scattered crackles no wheezes CVS: S1, S2 no murmurs, no S3.  ABD: Soft non tender. Bowel sounds normal.  Ext: No edema  MS: decreased  ROM spine, shoulders, hips and knees.  Skin: Intact, no ulcerations or rash noted.  Psych: Good eye contact, normal affect. Memory intact not anxious or depressed appearing.  CNS: CN 2-12 intact, power,  normal throughout.        Assessment & Plan:

## 2012-12-18 ENCOUNTER — Telehealth: Payer: Self-pay | Admitting: Family Medicine

## 2012-12-18 DIAGNOSIS — E119 Type 2 diabetes mellitus without complications: Secondary | ICD-10-CM | POA: Insufficient documentation

## 2012-12-18 NOTE — Assessment & Plan Note (Signed)
Updated lab needed, uncontrolled when last checked, pt c/o intolerance to meds

## 2012-12-18 NOTE — Assessment & Plan Note (Signed)
Decongestants and antibiotics prescribed 

## 2012-12-18 NOTE — Assessment & Plan Note (Signed)
Patient advised to reduce carb and sweets, commit to regular physical activity, , and attempt to lose weight, to improve blood sugar control.  Updated lab needed

## 2012-12-18 NOTE — Assessment & Plan Note (Signed)
Controlled, no change in medication DASH diet and commitment to daily physical activity for a minimum of 30 minutes discussed and encouraged, as a part of hypertension management. The importance of attaining a healthy weight is also discussed.  

## 2012-12-18 NOTE — Assessment & Plan Note (Signed)
Uncontrolled, injections in office, pt still has not been accepted in new pain clinic

## 2012-12-18 NOTE — Telephone Encounter (Signed)
Please contact patient. Let him know he should have had labs fasting on 12/24, did not do them I NEED to see the labs. The refill on his pain med which I prescribed will be denied until he gets the lab, PLEASE lET HIm kNOw.This is important

## 2012-12-20 NOTE — Telephone Encounter (Signed)
Message left on answering machine 

## 2012-12-23 ENCOUNTER — Telehealth: Payer: Self-pay | Admitting: Family Medicine

## 2012-12-23 LAB — LIPID PANEL
HDL: 41 mg/dL (ref >39)
LDL Cholesterol: 198 mg/dL — ABNORMAL HIGH (ref 0–99)
Triglycerides: 212 mg/dL — ABNORMAL HIGH (ref <150)
VLDL: 42 mg/dL — ABNORMAL HIGH (ref 0–40)

## 2012-12-23 NOTE — Telephone Encounter (Signed)
Please advise 

## 2012-12-23 NOTE — Telephone Encounter (Signed)
Result is not yet available, if he did it today will not be available for 2 days let him know pls

## 2012-12-25 LAB — COMPLETE METABOLIC PANEL WITH GFR
Albumin: 4.5 g/dL (ref 3.5–5.2)
Alkaline Phosphatase: 73 U/L (ref 39–117)
BUN: 17 mg/dL (ref 6–23)
CO2: 26 mEq/L (ref 19–32)
GFR, Est African American: >89 mL/min
GFR, Est Non African American: 87 mL/min
Glucose, Bld: 158 mg/dL — ABNORMAL HIGH (ref 70–99)
Total Bilirubin: 0.5 mg/dL (ref 0.3–1.2)

## 2012-12-25 NOTE — Telephone Encounter (Signed)
I still need the hepatic panel and chem 7 not seeing them pls call lab should be here

## 2012-12-25 NOTE — Telephone Encounter (Signed)
Can patient have refill on hydrocodone?

## 2012-12-26 MED ORDER — FENTANYL 75 MCG/HR TD PT72: 1.0000 | MEDICATED_PATCH | TRANSDERMAL | Status: DC

## 2012-12-26 NOTE — Telephone Encounter (Signed)
Patient in to collect rx.  

## 2012-12-26 NOTE — Addendum Note (Signed)
Addended by: Abner Greenspan on: 12/26/2012 02:02 PM   Modules accepted: Orders, Medications

## 2012-12-26 NOTE — Addendum Note (Signed)
Addended by: Abner Greenspan on: 12/26/2012 02:00 PM   Modules accepted: Orders

## 2013-01-03 ENCOUNTER — Telehealth: Payer: Self-pay | Admitting: Family Medicine

## 2013-01-03 NOTE — Telephone Encounter (Signed)
Called patient and left message for them to return call at the office   

## 2013-01-06 NOTE — Telephone Encounter (Signed)
Pt aware that he will be referred back to pain management

## 2013-01-07 ENCOUNTER — Telehealth: Payer: Self-pay | Admitting: Family Medicine

## 2013-01-07 NOTE — Telephone Encounter (Signed)
pls call Dr Caro Laroche office.Pt was referred there per his request since 08/2012, states he never heard back He had requested leaving Dr Ronal Fear office. Please find out if Dr Laurian Brim will shed an appt to treat him, if not let me know

## 2013-01-07 NOTE — Telephone Encounter (Signed)
Called again wanting to know about why his fentanyl patch wasn't approved. I again told him that he was going to be referred back to pain management. Has the referral been made?

## 2013-01-07 NOTE — Telephone Encounter (Signed)
see previous note

## 2013-01-08 ENCOUNTER — Other Ambulatory Visit: Payer: Self-pay | Admitting: Family Medicine

## 2013-01-08 DIAGNOSIS — G894 Chronic pain syndrome: Secondary | ICD-10-CM

## 2013-01-08 NOTE — Telephone Encounter (Signed)
Patient stated to me yesterday that Dr. Weldon Inches was not going to see him but I will call and verify this

## 2013-01-08 NOTE — Telephone Encounter (Signed)
pls let me know what you hear when you spk to Dr Caro Laroche office if they will not see him ask Mr Mark Walls where he would rather go, [place] or [place] to a pain clinic then pls refer him to the area he prefers. I am putting in a general referral , no name of pain doc Questions..please ask

## 2013-01-10 NOTE — Telephone Encounter (Signed)
Waiting on Lupo at Dr. Mosetta Pigeon office to call me back to let me know

## 2013-01-15 ENCOUNTER — Other Ambulatory Visit: Payer: Self-pay | Admitting: Family Medicine

## 2013-01-15 ENCOUNTER — Telehealth: Payer: Self-pay

## 2013-01-15 MED ORDER — DIPHENOXYLATE-ATROPINE 2.5-0.025 MG PO TABS: 1.0000 | ORAL_TABLET | Freq: Four times a day (QID) | ORAL | Status: AC | PRN

## 2013-01-15 MED ORDER — ONDANSETRON HCL 4 MG PO TABS: 4.0000 mg | ORAL_TABLET | Freq: Every day | ORAL | Status: AC | PRN

## 2013-01-15 NOTE — Telephone Encounter (Signed)
3 to 4 days h/o diarrheah, having loose stools every 3 to 4 hours, nausea no vomit , will send inzofran and lomotil pt aware, pls fax I have not prescribed and will not prescribe pain med for the pt,I remind him of this. I am  not at all understanding why he reports withdrawal , per nurse he told her he last had pain med 3 weeks ago, again I will not be prescribing pain medication for Mark Walls

## 2013-01-15 NOTE — Telephone Encounter (Signed)
Please call Dr Ronal Fear office land see if Dr Gerilyn Pilgrim will take pt back for chronic pain management. My understanding from pt is that he , the pt had chosen to seek new provider and not that he was discharged. If Dr Gerilyn Pilgrim will not take him back, then try pain clinic in [place] or [place] please

## 2013-01-15 NOTE — Telephone Encounter (Signed)
Pt has appt with dr. Harriett Sine office on 01/20/2013 11:00. Pt is aware

## 2013-01-22 ENCOUNTER — Telehealth: Payer: Self-pay | Admitting: Family Medicine

## 2013-01-22 NOTE — Telephone Encounter (Signed)
I will send a message to Dr Gerilyn Pilgrim to dr Gerilyn Pilgrim and contact pt after I have a response, pls let him know

## 2013-01-22 NOTE — Telephone Encounter (Signed)
Noted faxed to Dr. Gerilyn Pilgrim

## 2013-02-03 ENCOUNTER — Telehealth: Payer: Self-pay | Admitting: Family Medicine

## 2013-02-04 ENCOUNTER — Telehealth: Payer: Self-pay | Admitting: Family Medicine

## 2013-02-04 ENCOUNTER — Other Ambulatory Visit: Payer: Self-pay | Admitting: Family Medicine

## 2013-02-04 DIAGNOSIS — M79643 Pain in unspecified hand: Secondary | ICD-10-CM

## 2013-02-04 DIAGNOSIS — M25569 Pain in unspecified knee: Secondary | ICD-10-CM

## 2013-02-04 NOTE — Telephone Encounter (Signed)
I have also written a note which needs to be received to Dr Hilda Lias, pls ensure this is received at the time of referral

## 2013-02-04 NOTE — Telephone Encounter (Signed)
I will refer pt to0 Dr Hilda Lias let him know pls

## 2013-02-05 NOTE — Telephone Encounter (Signed)
The letter was sent with the referral

## 2013-02-05 NOTE — Telephone Encounter (Signed)
Patient is aware 

## 2013-02-18 NOTE — Telephone Encounter (Signed)
Patient referred to Pain Mgmt.

## 2013-04-03 ENCOUNTER — Ambulatory Visit: Payer: Medicaid Other | Admitting: Family Medicine

## 2013-04-17 ENCOUNTER — Ambulatory Visit (INDEPENDENT_AMBULATORY_CARE_PROVIDER_SITE_OTHER): Payer: Medicaid Other | Admitting: Family Medicine

## 2013-04-17 ENCOUNTER — Encounter: Payer: Self-pay | Admitting: Family Medicine

## 2013-04-17 VITALS — BP 170/80 | HR 82 | Resp 18 | Ht 67.0 in | Wt 221.1 lb

## 2013-04-17 DIAGNOSIS — Z91199 Patient's noncompliance with other medical treatment and regimen due to unspecified reason: Secondary | ICD-10-CM

## 2013-04-17 DIAGNOSIS — Z9119 Patient's noncompliance with other medical treatment and regimen: Secondary | ICD-10-CM

## 2013-04-17 DIAGNOSIS — R5381 Other malaise: Secondary | ICD-10-CM

## 2013-04-17 DIAGNOSIS — M25562 Pain in left knee: Secondary | ICD-10-CM

## 2013-04-17 DIAGNOSIS — E785 Hyperlipidemia, unspecified: Secondary | ICD-10-CM

## 2013-04-17 DIAGNOSIS — R5383 Other fatigue: Secondary | ICD-10-CM

## 2013-04-17 DIAGNOSIS — Z125 Encounter for screening for malignant neoplasm of prostate: Secondary | ICD-10-CM

## 2013-04-17 DIAGNOSIS — F411 Generalized anxiety disorder: Secondary | ICD-10-CM

## 2013-04-17 DIAGNOSIS — I10 Essential (primary) hypertension: Secondary | ICD-10-CM

## 2013-04-17 DIAGNOSIS — E119 Type 2 diabetes mellitus without complications: Secondary | ICD-10-CM

## 2013-04-17 DIAGNOSIS — E663 Overweight: Secondary | ICD-10-CM

## 2013-04-17 DIAGNOSIS — M25569 Pain in unspecified knee: Secondary | ICD-10-CM

## 2013-04-17 LAB — GLUCOSE, POCT (MANUAL RESULT ENTRY): POC Glucose: 282 mg/dl — AB (ref 70–99)

## 2013-04-17 MED ORDER — INSULIN ASPART 100 UNIT/ML ~~LOC~~ SOLN
3.0000 [IU] | Freq: Once | SUBCUTANEOUS | Status: AC
  Administered 2013-04-17: 3 [IU] via SUBCUTANEOUS

## 2013-04-17 MED ORDER — CLONIDINE HCL 0.2 MG PO TABS: ORAL_TABLET | ORAL | Status: DC

## 2013-04-17 MED ORDER — BENAZEPRIL-HYDROCHLOROTHIAZIDE 20-12.5 MG PO TABS: 1.0000 | ORAL_TABLET | Freq: Every day | ORAL | Status: DC

## 2013-04-17 NOTE — Progress Notes (Signed)
Subjective:    Patient ID: Mark Walls, male    DOB: 1955/12/21, 57 y.o.   MRN: [id number]  HPI The PT is here for follow up and re-evaluation of chronic medical conditions, medication management and review of any available recent lab and radiology data.  Preventive health is updated, specifically  Cancer screening and Immunization.    The PT denies any adverse reactions to current medications since the last visit.  There are no new concerns.  C/o increased left knee pain and right ankle pain for the past 6 and 2 months respectively, requests referral to orthpopedics for further management of this problem States he has been experiencing dry mouth and polyuria , feels blood sugar e;levated, states he needs a meter Reports he is on no pain meds, and that his psych is prescribing xanax andcymbalta     Review of Systems See HPI Denies recent fever or chills. Denies sinus pressure, nasal congestion, ear pain or sore throat. Denies chest congestion, productive cough or wheezing. Denies chest pains, palpitations and leg swelling Denies abdominal pain, nausea, vomiting,diarrhea or constipation.   Denies dysuria, frequency, hesitancy or incontinence. Denies joint pain, swelling and limitation in mobility. Denies headaches, seizures, numbness, or tingling. Denies uncontrolled  depression, anxiety or insomnia. Denies skin break down or rash.        Objective:   Physical Exam  Patient alert and oriented and in no cardiopulmonary distress.  HEENT: No facial asymmetry, EOMI, no sinus tenderness,  oropharynx pink and moist.  Neck supple no adenopathy.  Chest: Clear to auscultation bilaterally.Decreased air entry throughout  CVS: S1, S2 no murmurs, no S3.  ABD: Soft non tender. Bowel sounds normal.  Ext: No edema  MS: decreased  ROM spine,and knees.  Skin: Intact, no ulcerations or rash noted.  Psych: Good eye contact, normal affect. Memory intact not anxious or depressed  appearing.  CNS: CN 2-12 intact, power,  normal throughout.       Assessment & Plan:

## 2013-04-17 NOTE — Patient Instructions (Addendum)
F./u in 6 weeks.  Microalb from office today  Lipid, cmp and EGFr, hBA1C  Today,,cBc, tSH and pSa today.  Congrats on smoking cessation  Insulin 3 units in office today.  Blood pressure is high you will receive a script for 2 medications , you need to fill and take these daily for your blood pressure  You will be referred for eye exam, youi will get a script for once daily sugar testing and a log book to record with ranges highlighted.  There are also diabetic classes at the Hospital which I strongly encourage you to attend

## 2013-04-18 LAB — MICROALBUMIN / CREATININE URINE RATIO
Creatinine, Urine: 157.5 mg/dL
Microalb, Ur: 1.08 mg/dL (ref 0.00–1.89)

## 2013-04-19 DIAGNOSIS — Z91199 Patient's noncompliance with other medical treatment and regimen due to unspecified reason: Secondary | ICD-10-CM | POA: Insufficient documentation

## 2013-04-19 DIAGNOSIS — Z9119 Patient's noncompliance with other medical treatment and regimen: Secondary | ICD-10-CM | POA: Insufficient documentation

## 2013-04-19 NOTE — Assessment & Plan Note (Signed)
Deteriorated. Patient re-educated about  the importance of commitment to a  minimum of 150 minutes of exercise per week. The importance of healthy food choices with portion control discussed. Encouraged to start a food diary, count calories and to consider  joining a support group. Sample diet sheets offered. Goals set by the patient for the next several months.    

## 2013-04-19 NOTE — Assessment & Plan Note (Signed)
Uncontrolled, non compliant with medication , and has not  Brought in medication as usual and unable to clarify exactly what he is taking DASH diet and commitment to daily physical activity for a minimum of 30 minutes discussed and encouraged, as a part of hypertension management. The importance of attaining a healthy weight is also discussed.

## 2013-04-19 NOTE — Assessment & Plan Note (Signed)
Compliance with medication management of hypertension a chronic issue, pt was to have labs the day of the visit, did not do so, and blood sugar has markedly increased, he needs treatment , but with no lab data this is not possible

## 2013-04-19 NOTE — Assessment & Plan Note (Signed)
Uncontrolled and deteriorated Updated lab needed s Patient advised to reduce carb and sweets, commit to regular physical activity, take meds as prescribed, test blood as directed, and attempt to lose weight, to improve blood sugar control. o that medication can be prescribed, pt to have labs today Insulin administered in office

## 2013-04-19 NOTE — Assessment & Plan Note (Signed)
Deteriorated requests ortho eval referral entered

## 2013-04-19 NOTE — Assessment & Plan Note (Signed)
Sees mental health provider Dr Omelia Blackwater, xanax being prescribed and cymbalta reportedly

## 2013-04-25 ENCOUNTER — Other Ambulatory Visit: Payer: Self-pay | Admitting: Family Medicine

## 2013-04-30 ENCOUNTER — Telehealth: Payer: Self-pay | Admitting: Family Medicine

## 2013-04-30 ENCOUNTER — Other Ambulatory Visit: Payer: Self-pay | Admitting: Family Medicine

## 2013-04-30 NOTE — Telephone Encounter (Signed)
Patient is aware 

## 2013-05-28 ENCOUNTER — Ambulatory Visit: Payer: Medicaid Other | Admitting: Family Medicine

## 2013-06-03 ENCOUNTER — Telehealth: Payer: Self-pay | Admitting: Family Medicine

## 2013-06-03 NOTE — Telephone Encounter (Signed)
Patient will call Dr. Lenard Lance office and make an appointment

## 2013-06-18 ENCOUNTER — Encounter: Payer: Self-pay | Admitting: Family Medicine

## 2013-06-18 ENCOUNTER — Ambulatory Visit (INDEPENDENT_AMBULATORY_CARE_PROVIDER_SITE_OTHER): Payer: Medicaid Other | Admitting: Family Medicine

## 2013-06-18 ENCOUNTER — Other Ambulatory Visit: Payer: Self-pay | Admitting: Family Medicine

## 2013-06-18 VITALS — BP 142/72 | HR 72 | Resp 18 | Ht 67.0 in | Wt 215.0 lb

## 2013-06-18 DIAGNOSIS — F329 Major depressive disorder, single episode, unspecified: Secondary | ICD-10-CM

## 2013-06-18 DIAGNOSIS — R748 Abnormal levels of other serum enzymes: Secondary | ICD-10-CM

## 2013-06-18 DIAGNOSIS — Z125 Encounter for screening for malignant neoplasm of prostate: Secondary | ICD-10-CM

## 2013-06-18 DIAGNOSIS — E785 Hyperlipidemia, unspecified: Secondary | ICD-10-CM

## 2013-06-18 DIAGNOSIS — Z9119 Patient's noncompliance with other medical treatment and regimen: Secondary | ICD-10-CM

## 2013-06-18 DIAGNOSIS — IMO0002 Reserved for concepts with insufficient information to code with codable children: Secondary | ICD-10-CM

## 2013-06-18 DIAGNOSIS — E119 Type 2 diabetes mellitus without complications: Secondary | ICD-10-CM

## 2013-06-18 DIAGNOSIS — I10 Essential (primary) hypertension: Secondary | ICD-10-CM

## 2013-06-18 DIAGNOSIS — Z1211 Encounter for screening for malignant neoplasm of colon: Secondary | ICD-10-CM

## 2013-06-18 LAB — TSH: TSH: 3.166 u[IU]/mL (ref 0.350–4.500)

## 2013-06-18 LAB — GLUCOSE, POCT (MANUAL RESULT ENTRY): POC Glucose: 235 mg/dl — AB (ref 70–99)

## 2013-06-18 MED ORDER — SITAGLIPTIN PHOS-METFORMIN HCL 50-1000 MG PO TABS: 1.0000 | ORAL_TABLET | Freq: Two times a day (BID) | ORAL | Status: DC

## 2013-06-18 NOTE — Patient Instructions (Addendum)
F/U in 3 month, call if you need me before   New medication for blood sugar janummet one twice daily, stop metformin   Labs today cmp and EGFR, hBA1C, TSH, and PSA   Toradol 60mg  IM in the office today for back pain

## 2013-06-18 NOTE — Progress Notes (Signed)
Subjective:    Patient ID: Mark Walls, male    DOB: [birth year], 57 y.o.   MRN: [id number]  HPI The PT is here for follow up and re-evaluation of chronic medical conditions, medication management and review of any available recent lab and radiology data.  Preventive health is updated, specifically  Cancer screening and Immunization.   Questions or concerns regarding consultations or procedures which the PT has had in the interim are  addressed. The PT denies any adverse reactions to current medications since the last visit.  States excessive fatigue for 1 month, blood sugar fasting over 200 C/o uncontrolled back pain , wants ortho eval for same. Has skin lesion he wants derm to check on      Review of Systems See HPI Denies recent fever or chills. Denies sinus pressure, nasal congestion, ear pain or sore throat. Denies chest congestion, productive cough or wheezing. Denies chest pains, palpitations and leg swelling Denies abdominal pain, nausea, vomiting,diarrhea or constipation.   Denies dysuria, frequency, hesitancy or incontinence.  Denies headaches, seizures, numbness, or tingling. Denies uncontrolled  depression, anxiety or insomnia. Denies skin break down or rash.        Objective:   Physical Exam Patient alert and oriented and in no cardiopulmonary distress.  HEENT: No facial asymmetry, EOMI, no sinus tenderness,  oropharynx pink and moist.  Neck supple no adenopathy.  Chest: Clear to auscultation bilaterally.  CVS: S1, S2 no murmurs, no S3.  ABD: Soft non tender. Bowel sounds normal.  Ext: No edema  MS: decreased though adequate ROM spine, shoulders, hips and knees.  Skin: Intact, no ulcerations or rash noted.  Psych: Good eye contact, normal affect. Memory intact not anxious or depressed appearing.  CNS: CN 2-12 intact, power, tone and sensation normal throughout.        Assessment & Plan:

## 2013-06-19 LAB — COMPLETE METABOLIC PANEL WITH GFR
ALT: 119 U/L — ABNORMAL HIGH (ref 0–53)
BUN: 20 mg/dL (ref 6–23)
CO2: 25 mEq/L (ref 19–32)
Calcium: 9.7 mg/dL (ref 8.4–10.5)
Chloride: 101 mEq/L (ref 96–112)
Creat: 1 mg/dL (ref 0.50–1.35)
GFR, Est African American: >89 mL/min
GFR, Est Non African American: 83 mL/min
Glucose, Bld: 214 mg/dL — ABNORMAL HIGH (ref 70–99)

## 2013-06-19 MED ORDER — CANAGLIFLOZIN 300 MG PO TABS
1.0000 | ORAL_TABLET | Freq: Every day | ORAL | Status: DC
Start: 2013-06-19 — End: 2013-08-15

## 2013-06-19 MED ORDER — KETOROLAC TROMETHAMINE 60 MG/2ML IJ SOLN
60.0000 mg | Freq: Once | INTRAMUSCULAR | Status: AC
  Administered 2013-06-18: 60 mg via INTRAMUSCULAR

## 2013-06-23 LAB — HEPATITIS PANEL, ACUTE

## 2013-06-24 ENCOUNTER — Telehealth: Payer: Self-pay

## 2013-06-24 NOTE — Telephone Encounter (Signed)
LMOM to call to be triaged for colonoscopy.

## 2013-06-25 ENCOUNTER — Telehealth: Payer: Self-pay | Admitting: Family Medicine

## 2013-06-26 ENCOUNTER — Other Ambulatory Visit: Payer: Self-pay

## 2013-06-27 ENCOUNTER — Other Ambulatory Visit: Payer: Self-pay | Admitting: Family Medicine

## 2013-06-27 DIAGNOSIS — M549 Dorsalgia, unspecified: Secondary | ICD-10-CM

## 2013-06-27 DIAGNOSIS — L989 Disorder of the skin and subcutaneous tissue, unspecified: Secondary | ICD-10-CM

## 2013-06-27 NOTE — Telephone Encounter (Signed)
referrals have been entered pls sched and let pt know

## 2013-07-02 NOTE — Telephone Encounter (Signed)
LMOM to call.

## 2013-07-07 ENCOUNTER — Telehealth: Payer: Self-pay | Admitting: Family Medicine

## 2013-07-07 NOTE — Assessment & Plan Note (Signed)
Controlled on cymbalta

## 2013-07-07 NOTE — Assessment & Plan Note (Signed)
Uncontrolled, dietary management only due to elevation in liver enzymes Hyperlipidemia:Low fat diet discussed and encouraged.

## 2013-07-07 NOTE — Assessment & Plan Note (Signed)
Uncontrolled, pt  reports intolerance to many meds, and I suspect his med compliance with BP meds is insufficient. The importance of attaining BP at goal to reduce comlication risk is stressed

## 2013-07-07 NOTE — Assessment & Plan Note (Signed)
Ongoing problem seriously jeapordizing patient's health, will further address at next visit

## 2013-07-07 NOTE — Assessment & Plan Note (Signed)
Uncontrolled, pt independently took himself off of medication, the importance of compliance with treatment and diet is stressed Patient advised to reduce carb and sweets, commit to regular physical activity, take meds as prescribed, test blood as directed, and attempt to lose weight, to improve blood sugar control.

## 2013-07-07 NOTE — Telephone Encounter (Signed)
Pls contact pt's pharmacy and get a printout of list of scripts filled in month of June and July pls, pls lv in y office when you get it , I am really concerned about compliance , drug interactions and adverse s/e of possibly excessive tylenol in pain meds affecting his liver

## 2013-07-07 NOTE — Assessment & Plan Note (Addendum)
Reports uncontrolled pain, will refer to ortho per his request for management

## 2013-07-08 ENCOUNTER — Other Ambulatory Visit (HOSPITAL_COMMUNITY): Payer: Self-pay | Admitting: Orthopaedic Surgery

## 2013-07-08 DIAGNOSIS — M545 Low back pain: Secondary | ICD-10-CM

## 2013-07-08 NOTE — Telephone Encounter (Signed)
Called and spoke with Lucendia Herrlich at [facility].  She will fax over printout of meds collected for June and July

## 2013-07-09 LAB — HEPATITIS PANEL, ACUTE: Hepatitis B Surface Ag: NEGATIVE

## 2013-07-10 ENCOUNTER — Encounter: Payer: Self-pay | Admitting: Family Medicine

## 2013-07-10 ENCOUNTER — Encounter (HOSPITAL_COMMUNITY): Payer: Self-pay

## 2013-07-10 ENCOUNTER — Other Ambulatory Visit: Payer: Self-pay | Admitting: Family Medicine

## 2013-07-10 ENCOUNTER — Ambulatory Visit (HOSPITAL_COMMUNITY)
Admission: RE | Admit: 2013-07-10 | Discharge: 2013-07-10 | Disposition: A | Discharge status: Home / Self Care | Payer: Medicaid Other | Source: Ambulatory Visit | Attending: Family Medicine | Admitting: Family Medicine

## 2013-07-10 ENCOUNTER — Ambulatory Visit (HOSPITAL_COMMUNITY)
Admission: RE | Admit: 2013-07-10 | Discharge: 2013-07-10 | Disposition: A | Discharge status: Home / Self Care | Payer: Medicaid Other | Source: Ambulatory Visit | Attending: Orthopaedic Surgery | Admitting: Orthopaedic Surgery

## 2013-07-10 DIAGNOSIS — M545 Low back pain, unspecified: Secondary | ICD-10-CM

## 2013-07-10 DIAGNOSIS — M47817 Spondylosis without myelopathy or radiculopathy, lumbosacral region: Secondary | ICD-10-CM | POA: Insufficient documentation

## 2013-07-10 DIAGNOSIS — N2889 Other specified disorders of kidney and ureter: Secondary | ICD-10-CM

## 2013-07-10 DIAGNOSIS — Q619 Cystic kidney disease, unspecified: Secondary | ICD-10-CM | POA: Insufficient documentation

## 2013-07-10 DIAGNOSIS — R748 Abnormal levels of other serum enzymes: Secondary | ICD-10-CM

## 2013-07-15 ENCOUNTER — Ambulatory Visit: Payer: Medicaid Other | Admitting: Urology

## 2013-07-16 NOTE — Telephone Encounter (Signed)
Pt is scheduled OV with LL on 08/11/2013 at 9:00 AM for diarrhea .

## 2013-08-08 ENCOUNTER — Encounter: Payer: Self-pay | Admitting: General Surgery

## 2013-08-11 ENCOUNTER — Other Ambulatory Visit: Payer: Self-pay | Admitting: Internal Medicine

## 2013-08-11 ENCOUNTER — Encounter: Payer: Self-pay | Admitting: Gastroenterology

## 2013-08-11 ENCOUNTER — Ambulatory Visit (INDEPENDENT_AMBULATORY_CARE_PROVIDER_SITE_OTHER): Payer: Medicaid Other | Admitting: Gastroenterology

## 2013-08-11 VITALS — BP 145/63 | HR 69 | Temp 97.9°F | Ht 67.0 in | Wt 222.4 lb

## 2013-08-11 DIAGNOSIS — K219 Gastro-esophageal reflux disease without esophagitis: Secondary | ICD-10-CM | POA: Insufficient documentation

## 2013-08-11 DIAGNOSIS — R7989 Other specified abnormal findings of blood chemistry: Secondary | ICD-10-CM

## 2013-08-11 DIAGNOSIS — R197 Diarrhea, unspecified: Secondary | ICD-10-CM

## 2013-08-11 DIAGNOSIS — K529 Noninfective gastroenteritis and colitis, unspecified: Secondary | ICD-10-CM

## 2013-08-11 DIAGNOSIS — R945 Abnormal results of liver function studies: Secondary | ICD-10-CM | POA: Insufficient documentation

## 2013-08-11 DIAGNOSIS — R6881 Early satiety: Secondary | ICD-10-CM

## 2013-08-11 DIAGNOSIS — K760 Fatty (change of) liver, not elsewhere classified: Secondary | ICD-10-CM

## 2013-08-11 MED ORDER — PEG 3350-KCL-NA BICARB-NACL 420 G PO SOLR: 4000.0000 mL | ORAL | Status: DC

## 2013-08-11 NOTE — Assessment & Plan Note (Addendum)
Complains of excessive belching, early satiety, atypical chest pain resolved with belching, no frequent heartburn. No prior upper endoscopy. Recommend diagnostic EGD at this time. Consider small bowel biopsy for chronic diarrhea as per Dr. Jena Gauss. Often it conscious sedation as before.  I have discussed the risks, alternatives, benefits with regards to but not limited to the risk of reaction to medication, bleeding, infection, perforation and the patient is agreeable to proceed. Written consent to be obtained.  Await EGD findings before initiating trial of PPI.

## 2013-08-11 NOTE — Patient Instructions (Addendum)
1. Colonoscopy and upper endoscopy with Dr. Jena Gauss in the near future. Please see separate instructions. Instructions for fatty liver: Recommend 1-2# weight loss per week until ideal body weight through exercise & diet. Low fat/cholesterol diet.   Avoid sweets, sodas, fruit juices, sweetened beverages like tea, etc. Gradually increase exercise from 15 min daily up to 1 hr per day 5 days/week. Limit alcohol use.  Fatty Liver Fatty liver is the accumulation of fat in liver cells. It is also called hepatosteatosis or steatohepatitis. It is normal for your liver to contain some fat. If fat is more than 5 to 10% of your liver's weight, you have fatty liver.  There are often no symptoms (problems) for years while damage is still occurring. People often learn about their fatty liver when they have medical tests for other reasons. Fat can damage your liver for years or even decades without causing problems. When it becomes severe, it can cause fatigue, weight loss, weakness, and confusion. This makes you more likely to develop more serious liver problems. The liver is the largest organ in the body. It does a lot of work and often gives no warning signs when it is sick until late in a disease. The liver has many important jobs including:  Breaking down foods.  Storing vitamins, iron, and other minerals.  Making proteins.  Making bile for food digestion.  Breaking down many products including medications, alcohol and some poisons. CAUSES  There are a number of different conditions, medications, and poisons that can cause a fatty liver. Eating too many calories causes fat to build up in the liver. Not processing and breaking fats down normally may also cause this. Certain conditions, such as obesity, diabetes, and high triglycerides also cause this. Most fatty liver patients tend to be middle-aged and over weight.  Some causes of fatty liver are:  Alcohol over consumption.  Malnutrition.  Steroid  use.  Valproic acid toxicity.  Obesity.  Cushing's syndrome.  Poisons.  Tetracycline in high dosages.  Pregnancy.  Diabetes.  Hyperlipidemia.  Rapid weight loss. Some people develop fatty liver even having none of these conditions. SYMPTOMS  Fatty liver most often causes no problems. This is called asymptomatic.  It can be diagnosed with blood tests and also by a liver biopsy.  It is one of the most common causes of minor elevations of liver enzymes on routine blood tests.  Specialized Imaging of the liver using ultrasound, CT (computed tomography) scan, or MRI (magnetic resonance imaging) can suggest a fatty liver but a biopsy is needed to confirm it.  A biopsy involves taking a small sample of liver tissue. This is done by using a needle. It is then looked at under a microscope by a specialist. TREATMENT  It is important to treat the cause. Simple fatty liver without a medical reason may not need treatment.  Weight loss, fat restriction, and exercise in overweight patients produces inconsistent results but is worth trying.  Fatty liver due to alcohol toxicity may not improve even with stopping drinking.  Good control of diabetes may reduce fatty liver.  Lower your triglycerides through diet, medication or both.  Eat a balanced, healthy diet.  Increase your physical activity.  Get regular checkups from a liver specialist.  There are no medical or surgical treatments for a fatty liver or NASH, but improving your diet and increasing your exercise may help prevent or reverse some of the damage. PROGNOSIS  Fatty liver may cause no damage or it can  lead to an inflammation of the liver. This is, called steatohepatitis. When it is linked to alcohol abuse, it is called alcoholic steatohepatitis. It often is not linked to alcohol. It is then called nonalcoholic steatohepatitis, or NASH. Over time the liver may become scarred and hardened. This condition is called cirrhosis.  Cirrhosis is serious and may lead to liver failure or cancer. NASH is one of the leading causes of cirrhosis. About 10-20% of Americans have fatty liver and a smaller 2-5% has NASH. Document Released: 01/19/2006 Document Revised: 02/26/2012 Document Reviewed: 03/14/2006 [facility] Patient Information 2014 [place], [place].

## 2013-08-11 NOTE — Assessment & Plan Note (Signed)
Abnormal AST, ALT in the setting of weight gain, poorly controlled diabetes. Ultrasound suggests fatty liver. Biomarkers negative. Recommend management as per fatty liver for now. Repeat LFTs twice yearly.  Instructions for fatty liver: Recommend 1-2# weight loss per week until ideal body weight through exercise & diet. Low fat/cholesterol diet.   Avoid sweets, sodas, fruit juices, sweetened beverages like tea, etc. Gradually increase exercise from 15 min daily up to 1 hr per day 5 days/week. Limit alcohol use.

## 2013-08-11 NOTE — Progress Notes (Signed)
Primary Care Physician:  Margaret Simpson, MD  Primary Gastroenterologist:  Michael Rourk, MD   Chief Complaint  Patient presents with  . Colonoscopy  . Diarrhea    HPI:  Mark Walls is a 56 y.o. male here to schedule colonoscopy. Chronic diarrhea. BM 3-4 times per day, twice per week. Lot of gas with it. Centeral abdominal pain. Unrelated to meals. Nothing makes it better. No melena, brbpr. Occasional heartburn. But belching several days per week. Seems to be with diarrhea. Noted ever since gallbladder removed. States he has a normal amount of gas that he releases. Also complains a 15 pound weight gain since she quit smoking a year and a half ago. Feels like his abdomen is distended. Complains of early satiety. No dysphagia. Atypical chest pain, resolved with belching. No NSAIDs. No meds for GERD.  Noted to have elevated AST, ALT dating back 6 months. Ultrasound suggested fatty liver. Viral markers negative. Notably patient's hemoglobin A1c has climbed over the last one year as well.  Current Outpatient Prescriptions  Medication Sig Dispense Refill  . albuterol (PROVENTIL HFA;VENTOLIN HFA) 108 (90 BASE) MCG/ACT inhaler Inhale 2 puffs into the lungs every 6 (six) hours as needed.      . amphetamine-dextroamphetamine (ADDERALL) 10 MG tablet Take 10 mg by mouth 3 (three) times daily. Dr. [place]on[place] is the prescribes, this is per pt report       . beclomethasone (QVAR) 40 MCG/ACT inhaler Inhale 2 puffs into the lungs 2 (two) times daily.      . benazepril-hydrochlorthiazide (LOTENSIN HCT) 20-12.5 MG per tablet Take 1 tablet by mouth daily.  30 tablet  11  . Canagliflozin (INVOKANA) 300 MG TABS Take 1 tablet by mouth daily with breakfast.  30 tablet  5  . cetirizine (ZYRTEC) 10 MG tablet Take 10 mg by mouth daily as needed.      . cloNIDine (CATAPRES) 0.2 MG tablet One tablet at bedtime for blood pressure  30 tablet  5  . clotrimazole-betamethasone (LOTRISONE) lotion APPLY TOPICALLY TWICE DAILY   30 mL  1  . DULoxetine (CYMBALTA) 60 MG capsule Take 1 capsule (60 mg total) by mouth daily.  30 capsule  1  . fluticasone (FLONASE) 50 MCG/ACT nasal spray 2 sprays by Nasal route daily.  16 g  3  . HYDROcodone-acetaminophen (LORTAB) 7.5-500 MG per tablet Take 1 tablet by mouth every 8 (eight) hours as needed for pain.      . hydrocortisone (PROCTOCREAM-HC) 2.5 % rectal cream Place rectally 2 (two) times daily.        . promethazine (PHENERGAN) 25 MG tablet Take 25 mg by mouth. Take one to two tablet by mouth every 6 hours as needed       . sitaGLIPtan-metformin (JANUMET) 50-1000 MG per tablet Take 1 tablet by mouth 2 (two) times daily with a meal.  60 tablet  5  . vardenafil (LEVITRA) 20 MG tablet Take 20 mg by mouth daily as needed.         No current facility-administered medications for this visit.    Allergies as of 08/11/2013 - Review Complete 08/11/2013  Allergen Reaction Noted  . Allopurinol    . Levofloxacin  07/16/2009  . Sulfamethoxazole w-trimethoprim      Past Medical History  Diagnosis Date  . PVD (peripheral vascular disease)   . ED (erectile dysfunction)   . Anxiety   . Depression   . Obesity   . Chronic fatigue   . Nicotine addiction   .   Diabetes mellitus, type 2   . Hyperlipidemia   . Hypertension     Past Surgical History  Procedure Laterality Date  . Cholecystectomy    . Polpectomy colon    . Esophagogastroduodenoscopy  09/24/2002    [name]:gastritis of the antrum and the bodt of the stomach otherwise normal  . Left knee athroscopy  03/2008  . Rotator cuff repair  09/01/08  . Right elbow    . Left hand    . Colonoscopy  10/01/2002    [name]:small internal hemorrhoids/single small sessile polyp in the rectum/ otherwise normal. Hyperplastic polyp.  . Right common iliac artery stenting      Family History  Problem Relation Age of Onset  . Cirrhosis Mother   . Melanoma Mother   . Mental illness Brother   . Mental illness Father     suicide   .  Melanoma Brother   . Mental illness Brother   . Diabetes Brother   . Hypertension Brother   . Diabetes Brother   . Hypertension Brother   . Colon cancer Paternal Grandfather     age 63    History   Social History  . Marital Status: Married    Spouse Name: N/A    Number of Children: 2  . Years of Education: N/A   Occupational History  . disabled     Social History Main Topics  . Smoking status: Former Smoker -- 1.00 packs/day    Types: Cigarettes    Quit date: 11/19/2012  . Smokeless tobacco: Not on file  . Alcohol Use: No  . Drug Use: No  . Sexual Activity: Not on file   Other Topics Concern  . Not on file   Social History Narrative  . No narrative on file      ROS:  General: Negative for anorexia, weight loss, fever, chills, fatigue, weakness. Eyes: Negative for vision changes.  ENT: Negative for hoarseness, difficulty swallowing , nasal congestion. CV: Negative for chest pain, angina, palpitations, dyspnea on exertion, peripheral edema. See hpi. Respiratory: Negative for dyspnea at rest, dyspnea on exertion, cough, sputum, wheezing.  GI: See history of present illness. GU:  Negative for dysuria, hematuria, urinary incontinence, urinary frequency, nocturnal urination.  MS: chronic bilateral knee pain. Negative for low back pain.  Derm: Negative for rash or itching.  Neuro: Negative for weakness, abnormal sensation, seizure, frequent headaches, memory loss, confusion.  Psych: Negative for anxiety, depression, suicidal ideation, hallucinations.  Endo: Negative for unusual weight change.  Heme: Negative for bruising or bleeding. Allergy: Negative for rash or hives.    Physical Examination:  BP 145/63  Pulse 69  Temp(Src) 97.9 F (36.6 C) (Oral)  Ht 5' 7" (1.702 m)  Wt 222 lb 6.4 oz (100.88 kg)  BMI 34.82 kg/m2   General: Well-nourished, well-developed in no acute distress.  Head: Normocephalic, atraumatic.   Eyes: Conjunctiva pink, no icterus. Mouth:  Oropharyngeal mucosa moist and pink , no lesions erythema or exudate. Neck: Supple without thyromegaly, masses, or lymphadenopathy.  Lungs: Clear to auscultation bilaterally.  Heart: Regular rate and rhythm, no murmurs rubs or gallops.  Abdomen: Bowel sounds are normal, protruberant, nontender, nondistended, no hepatosplenomegaly or masses, no abdominal bruits or    hernia , no rebound or guarding.   Rectal: deferred Extremities: No lower extremity edema. No clubbing or deformities.  Neuro: Alert and oriented x 4 , grossly normal neurologically.  Skin: Warm and dry, no rash or jaundice.   Psych: Alert and cooperative, normal   mood and affect.  Labs: Lab Results  Component Value Date   ALT 119* 06/18/2013   AST 41* 06/18/2013   ALKPHOS 79 06/18/2013   BILITOT 0.7 06/18/2013   Lab Results  Component Value Date   HEPAIGM NEG 07/08/2013   HEPBIGM NEG 07/08/2013  Hepatitis B surface Ag: neg HCV Ab: neg  Lab Results  Component Value Date   CREATININE 1.00 06/18/2013   BUN 20 06/18/2013   NA 135 06/18/2013   K 4.8 06/18/2013   CL 101 06/18/2013   CO2 25 06/18/2013   Lab Results  Component Value Date   TSH 3.166 06/18/2013    Imaging Studies: Abdominal ultrasound July 2014 for abnormal LFTs Impression: Common bowel duct upper limits of normal caliber post cholecystectomy. No choledocholithiasis or intrahepatic dilation. Fatty liver. 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 The mass has not changed significantly in size. Some low intensity internal affect is most likely reflect debris. No papillary or definite solid nodular area was seen.    

## 2013-08-11 NOTE — Progress Notes (Signed)
CC'd to PCP 

## 2013-08-11 NOTE — Assessment & Plan Note (Signed)
Chronic intermittent diarrhea, self-limiting. Colonoscopy as planned. Consider small bowel biopsy at time of EGD. Augment conscious sedation with 25 mg of Phenergan IV 30 minutes before given polypharmacy.  I have discussed the risks, alternatives, benefits with regards to but not limited to the risk of reaction to medication, bleeding, infection, perforation and the patient is agreeable to proceed. Written consent to be obtained.

## 2013-08-15 ENCOUNTER — Encounter (HOSPITAL_COMMUNITY): Payer: Self-pay | Admitting: Pharmacy Technician

## 2013-08-19 ENCOUNTER — Encounter (HOSPITAL_COMMUNITY): Payer: Self-pay

## 2013-08-19 ENCOUNTER — Encounter (HOSPITAL_COMMUNITY)
Admission: RE | Disposition: A | Discharge status: Home / Self Care | Payer: Self-pay | Source: Ambulatory Visit | Attending: Internal Medicine

## 2013-08-19 ENCOUNTER — Ambulatory Visit: Payer: Medicaid Other | Admitting: Urology

## 2013-08-19 ENCOUNTER — Ambulatory Visit (HOSPITAL_COMMUNITY)
Admission: RE | Admit: 2013-08-19 | Discharge: 2013-08-19 | Disposition: A | Discharge status: Home / Self Care | Payer: Medicaid Other | Source: Ambulatory Visit | Attending: Internal Medicine | Admitting: Internal Medicine

## 2013-08-19 DIAGNOSIS — K3189 Other diseases of stomach and duodenum: Secondary | ICD-10-CM

## 2013-08-19 DIAGNOSIS — K219 Gastro-esophageal reflux disease without esophagitis: Secondary | ICD-10-CM

## 2013-08-19 DIAGNOSIS — R1013 Epigastric pain: Secondary | ICD-10-CM

## 2013-08-19 DIAGNOSIS — K2940 Chronic atrophic gastritis without bleeding: Secondary | ICD-10-CM

## 2013-08-19 DIAGNOSIS — K760 Fatty (change of) liver, not elsewhere classified: Secondary | ICD-10-CM

## 2013-08-19 DIAGNOSIS — R6881 Early satiety: Secondary | ICD-10-CM

## 2013-08-19 DIAGNOSIS — R197 Diarrhea, unspecified: Secondary | ICD-10-CM

## 2013-08-19 DIAGNOSIS — K529 Noninfective gastroenteritis and colitis, unspecified: Secondary | ICD-10-CM

## 2013-08-19 DIAGNOSIS — K648 Other hemorrhoids: Secondary | ICD-10-CM | POA: Insufficient documentation

## 2013-08-19 DIAGNOSIS — K449 Diaphragmatic hernia without obstruction or gangrene: Secondary | ICD-10-CM | POA: Insufficient documentation

## 2013-08-19 DIAGNOSIS — E119 Type 2 diabetes mellitus without complications: Secondary | ICD-10-CM | POA: Insufficient documentation

## 2013-08-19 DIAGNOSIS — I10 Essential (primary) hypertension: Secondary | ICD-10-CM | POA: Insufficient documentation

## 2013-08-19 HISTORY — PX: COLONOSCOPY WITH ESOPHAGOGASTRODUODENOSCOPY (EGD): SHX5779

## 2013-08-19 SURGERY — COLONOSCOPY WITH ESOPHAGOGASTRODUODENOSCOPY (EGD)
Anesthesia: Moderate Sedation

## 2013-08-19 MED ORDER — MEPERIDINE HCL 100 MG/ML IJ SOLN
INTRAMUSCULAR | Status: AC
  Filled 2013-08-19: qty 2

## 2013-08-19 MED ORDER — PROMETHAZINE HCL 25 MG/ML IJ SOLN
INTRAMUSCULAR | Status: AC
  Filled 2013-08-19: qty 1

## 2013-08-19 MED ORDER — MEPERIDINE HCL 100 MG/ML IJ SOLN
INTRAMUSCULAR | Status: DC | PRN
  Administered 2013-08-19: 50 mg via INTRAVENOUS
  Administered 2013-08-19 (×2): 25 mg via INTRAVENOUS

## 2013-08-19 MED ORDER — SODIUM CHLORIDE 0.9 % IJ SOLN
INTRAMUSCULAR | Status: AC
  Filled 2013-08-19: qty 10

## 2013-08-19 MED ORDER — MIDAZOLAM HCL 5 MG/5ML IJ SOLN
INTRAMUSCULAR | Status: DC | PRN
  Administered 2013-08-19: 2 mg via INTRAVENOUS
  Administered 2013-08-19 (×2): 1 mg via INTRAVENOUS

## 2013-08-19 MED ORDER — SODIUM CHLORIDE 0.9 % IV SOLN
INTRAVENOUS | Status: DC
  Administered 2013-08-19: 08:00:00 via INTRAVENOUS

## 2013-08-19 MED ORDER — BUTAMBEN-TETRACAINE-BENZOCAINE 2-2-14 % EX AERO
INHALATION_SPRAY | CUTANEOUS | Status: DC | PRN
  Administered 2013-08-19: 2 via TOPICAL

## 2013-08-19 MED ORDER — ONDANSETRON HCL 4 MG/2ML IJ SOLN
INTRAMUSCULAR | Status: DC | PRN
  Administered 2013-08-19: 4 mg via INTRAVENOUS

## 2013-08-19 MED ORDER — STERILE WATER FOR IRRIGATION IR SOLN
Status: DC | PRN
  Administered 2013-08-19: 09:00:00

## 2013-08-19 MED ORDER — ONDANSETRON HCL 4 MG/2ML IJ SOLN
INTRAMUSCULAR | Status: AC
  Filled 2013-08-19: qty 2

## 2013-08-19 MED ORDER — PROMETHAZINE HCL 25 MG/ML IJ SOLN
25.0000 mg | Freq: Once | INTRAMUSCULAR | Status: AC
  Administered 2013-08-19: 25 mg via INTRAVENOUS

## 2013-08-19 MED ORDER — MIDAZOLAM HCL 5 MG/5ML IJ SOLN
INTRAMUSCULAR | Status: AC
  Filled 2013-08-19: qty 10

## 2013-08-19 NOTE — Op Note (Signed)
[facility] [address] [place], [address]   COLONOSCOPY PROCEDURE REPORT  PATIENT: Walls, Mark  MR#:         [id number] BIRTHDATE: Aug 19, 1956 , 56  yrs. old GENDER: Male ENDOSCOPIST: R.  Roetta Sessions, MD FACP FACG REFERRED BY:  Syliva Overman, M.D. PROCEDURE DATE:  08/19/2013 PROCEDURE:     Ileocolonoscopy with segmental biopsy  INDICATIONS: Chronic diarrhea  INFORMED CONSENT:  The risks, benefits, alternatives and imponderables including but not limited to bleeding, perforation as well as the possibility of a missed lesion have been reviewed.  The potential for biopsy, lesion removal, etc. have also been discussed.  Questions have been answered.  All parties agreeable. Please see the history and physical in the medical record for more information.  MEDICATIONS: Versed 4 mg IV and Demerol 100 mg IV in divided doses. Phenergan 25 mg IV. Zofran 4 mg  DESCRIPTION OF PROCEDURE:  After a digital rectal exam was performed, the EC-[id number] ([id number])  colonoscope was advanced from the anus through the rectum and colon to the area of the cecum, ileocecal valve and appendiceal orifice.  The cecum was deeply intubated.  These structures were well-seen and photographed for the record.  From the level of the cecum and ileocecal valve, the scope was slowly and cautiously withdrawn.  The mucosal surfaces were carefully surveyed utilizing scope tip deflection to facilitate fold flattening as needed.  The scope was pulled down into the rectum where a thorough examination including retroflexion was performed.    FINDINGS:  Adequate preparation. Internal hemorrhoids; otherwise, normal rectal mucosa. Normal-appearing colonic mucosa. The distal 5 cm of terminal ileal mucosa also appeared normal.  THERAPEUTIC / DIAGNOSTIC MANEUVERS PERFORMED:  segmental biopsies of the ascending and sigmoid segments taken  COMPLICATIONS: None  CECAL WITHDRAWAL TIME:  10  minutes  IMPRESSION:  Internal hemorrhoids;  Otherwise, normal ileocolonoscopy - Status post segmental biopsy  RECOMMENDATIONS: See EGD report. Followup on pathology.   _______________________________ eSigned:  R. Roetta Sessions, MD FACP [facility] 08/19/2013 9:32 AM   CC:    PATIENT NAME:  Walls, Mark MR#: [id number]

## 2013-08-19 NOTE — Op Note (Signed)
[facility] [address] [place], [address]   ENDOSCOPY PROCEDURE REPORT  PATIENT: Mark Walls, Mark Walls  MR#: [id number] BIRTHDATE: 02-22-1956 , 56  yrs. old GENDER: Male ENDOSCOPIST: R.  Roetta Sessions, MD FACP FACG REFERRED BY:  Syliva Overman, M.D. PROCEDURE DATE:  08/19/2013 PROCEDURE:     EGD with gastric and duodenal biopsy  INDICATIONS:     Dyspepsia/GERD/chronic diarrhea  INFORMED CONSENT:   The risks, benefits, limitations, alternatives and imponderables have been discussed.  The potential for biopsy, esophogeal dilation, etc. have also been reviewed.  Questions have been answered.  All parties agreeable.  Please see the history and physical in the medical record for more information.  MEDICATIONS:    Versed 2 mg IV and Demerol 50 mg IV. Phenergan 25 mg IV and Zofran 4 mg IV. Cetacaine spray.  DESCRIPTION OF PROCEDURE:   The EG-[id number] ([id number])  endoscope was introduced through the mouth and advanced to the second portion of the duodenum without difficulty or limitations.  The mucosal surfaces were surveyed very carefully during advancement of the scope and upon withdrawal.  Retroflexion view of the proximal stomach and esophagogastric junction was performed.      FINDINGS: Normal esophagus. Patulous EG junction. Stomach empty. Small hiatal hernia. Diffuse mottled/erythematous gastric mucosa with submucosal petechiae in a patchy distribution. Some nodularity of the antrum. No ulcer or infiltrating process. Patent pylorus. Abnormal-appearing her second third portion of the duodenum  THERAPEUTIC / DIAGNOSTIC MANEUVERS PERFORMED:  biopsies of the abnormal-appearing gastric mucosa and duodenum taken.   COMPLICATIONS:  None  IMPRESSION:     Patulous EG junction. Hiatal hernia. Abnormal gastric mucosa  -  status post gastric and duodenal biopsy  RECOMMENDATIONS:   Begin Protonix 40 mg daily. Followup on pathology. See colonoscopy  report.    _______________________________ R. Roetta Sessions, MD FACP [facility] eSigned:  R. Roetta Sessions, MD FACP [facility] 08/19/2013 9:09 AM     CC:

## 2013-08-19 NOTE — H&P (View-Only) (Signed)
Primary Care Physician:  Syliva Overman, MD  Primary Gastroenterologist:  Roetta Sessions, MD   Chief Complaint  Patient presents with  . Colonoscopy  . Diarrhea    HPI:  Mark Walls is a 57 y.o. male here to schedule colonoscopy. Chronic diarrhea. BM 3-4 times per day, twice per week. Lot of gas with it. Centeral abdominal pain. Unrelated to meals. Nothing makes it better. No melena, brbpr. Occasional heartburn. But belching several days per week. Seems to be with diarrhea. Noted ever since gallbladder removed. States he has a normal amount of gas that he releases. Also complains a 15 pound weight gain since she quit smoking a year and a half ago. Feels like his abdomen is distended. Complains of early satiety. No dysphagia. Atypical chest pain, resolved with belching. No NSAIDs. No meds for GERD.  Noted to have elevated AST, ALT dating back 6 months. Ultrasound suggested fatty liver. Viral markers negative. Notably patient's hemoglobin A1c has climbed over the last one year as well.  Current Outpatient Prescriptions  Medication Sig Dispense Refill  . albuterol (PROVENTIL HFA;VENTOLIN HFA) 108 (90 BASE) MCG/ACT inhaler Inhale 2 puffs into the lungs every 6 (six) hours as needed.      Marland Kitchen amphetamine-dextroamphetamine (ADDERALL) 10 MG tablet Take 10 mg by mouth 3 (three) times daily. Dr. Gerilyn Pilgrim is the prescribes, this is per pt report       . beclomethasone (QVAR) 40 MCG/ACT inhaler Inhale 2 puffs into the lungs 2 (two) times daily.      . benazepril-hydrochlorthiazide (LOTENSIN HCT) 20-12.5 MG per tablet Take 1 tablet by mouth daily.  30 tablet  11  . Canagliflozin (INVOKANA) 300 MG TABS Take 1 tablet by mouth daily with breakfast.  30 tablet  5  . cetirizine (ZYRTEC) 10 MG tablet Take 10 mg by mouth daily as needed.      . cloNIDine (CATAPRES) 0.2 MG tablet One tablet at bedtime for blood pressure  30 tablet  5  . clotrimazole-betamethasone (LOTRISONE) lotion APPLY TOPICALLY TWICE DAILY   30 mL  1  . DULoxetine (CYMBALTA) 60 MG capsule Take 1 capsule (60 mg total) by mouth daily.  30 capsule  1  . fluticasone (FLONASE) 50 MCG/ACT nasal spray 2 sprays by Nasal route daily.  16 g  3  . HYDROcodone-acetaminophen (LORTAB) 7.5-500 MG per tablet Take 1 tablet by mouth every 8 (eight) hours as needed for pain.      . hydrocortisone (PROCTOCREAM-HC) 2.5 % rectal cream Place rectally 2 (two) times daily.        . promethazine (PHENERGAN) 25 MG tablet Take 25 mg by mouth. Take one to two tablet by mouth every 6 hours as needed       . sitaGLIPtan-metformin (JANUMET) 50-1000 MG per tablet Take 1 tablet by mouth 2 (two) times daily with a meal.  60 tablet  5  . vardenafil (LEVITRA) 20 MG tablet Take 20 mg by mouth daily as needed.         No current facility-administered medications for this visit.    Allergies as of 08/11/2013 - Review Complete 08/11/2013  Allergen Reaction Noted  . Allopurinol    . Levofloxacin  07/16/2009  . Sulfamethoxazole w-trimethoprim      Past Medical History  Diagnosis Date  . PVD (peripheral vascular disease)   . ED (erectile dysfunction)   . Anxiety   . Depression   . Obesity   . Chronic fatigue   . Nicotine addiction   .  Diabetes mellitus, type 2   . Hyperlipidemia   . Hypertension     Past Surgical History  Procedure Laterality Date  . Cholecystectomy    . Polpectomy colon    . Esophagogastroduodenoscopy  09/24/2002    [name]:gastritis of the antrum and the bodt of the stomach otherwise normal  . Left knee athroscopy  03/2008  . Rotator cuff repair  09/01/08  . Right elbow    . Left hand    . Colonoscopy  10/01/2002    [name]:small internal hemorrhoids/single small sessile polyp in the rectum/ otherwise normal. Hyperplastic polyp.  . Right common iliac artery stenting      Family History  Problem Relation Age of Onset  . Cirrhosis Mother   . Melanoma Mother   . Mental illness Brother   . Mental illness Father     suicide   .  Melanoma Brother   . Mental illness Brother   . Diabetes Brother   . Hypertension Brother   . Diabetes Brother   . Hypertension Brother   . Colon cancer Paternal Grandfather     age 58    History   Social History  . Marital Status: Married    Spouse Name: N/A    Number of Children: 2  . Years of Education: N/A   Occupational History  . disabled     Social History Main Topics  . Smoking status: Former Smoker -- 1.00 packs/day    Types: Cigarettes    Quit date: 11/19/2012  . Smokeless tobacco: Not on file  . Alcohol Use: No  . Drug Use: No  . Sexual Activity: Not on file   Other Topics Concern  . Not on file   Social History Narrative  . No narrative on file      ROS:  General: Negative for anorexia, weight loss, fever, chills, fatigue, weakness. Eyes: Negative for vision changes.  ENT: Negative for hoarseness, difficulty swallowing , nasal congestion. CV: Negative for chest pain, angina, palpitations, dyspnea on exertion, peripheral edema. See hpi. Respiratory: Negative for dyspnea at rest, dyspnea on exertion, cough, sputum, wheezing.  GI: See history of present illness. GU:  Negative for dysuria, hematuria, urinary incontinence, urinary frequency, nocturnal urination.  MS: chronic bilateral knee pain. Negative for low back pain.  Derm: Negative for rash or itching.  Neuro: Negative for weakness, abnormal sensation, seizure, frequent headaches, memory loss, confusion.  Psych: Negative for anxiety, depression, suicidal ideation, hallucinations.  Endo: Negative for unusual weight change.  Heme: Negative for bruising or bleeding. Allergy: Negative for rash or hives.    Physical Examination:  BP 145/63  Pulse 69  Temp(Src) 97.9 F (36.6 C) (Oral)  Ht 5\' 7"  (1.702 m)  Wt 222 lb 6.4 oz (100.88 kg)  BMI 34.82 kg/m2   General: Well-nourished, well-developed in no acute distress.  Head: Normocephalic, atraumatic.   Eyes: Conjunctiva pink, no icterus. Mouth:  Oropharyngeal mucosa moist and pink , no lesions erythema or exudate. Neck: Supple without thyromegaly, masses, or lymphadenopathy.  Lungs: Clear to auscultation bilaterally.  Heart: Regular rate and rhythm, no murmurs rubs or gallops.  Abdomen: Bowel sounds are normal, protruberant, nontender, nondistended, no hepatosplenomegaly or masses, no abdominal bruits or    hernia , no rebound or guarding.   Rectal: deferred Extremities: No lower extremity edema. No clubbing or deformities.  Neuro: Alert and oriented x 4 , grossly normal neurologically.  Skin: Warm and dry, no rash or jaundice.   Psych: Alert and cooperative, normal  mood and affect.  Labs: Lab Results  Component Value Date   ALT 119* 06/18/2013   AST 41* 06/18/2013   ALKPHOS 79 06/18/2013   BILITOT 0.7 06/18/2013   Lab Results  Component Value Date   HEPAIGM NEG 07/08/2013   HEPBIGM NEG 07/08/2013  Hepatitis B surface Ag: neg HCV Ab: neg  Lab Results  Component Value Date   CREATININE 1.00 06/18/2013   BUN 20 06/18/2013   NA 135 06/18/2013   K 4.8 06/18/2013   CL 101 06/18/2013   CO2 25 06/18/2013   Lab Results  Component Value Date   TSH 3.166 06/18/2013    Imaging Studies: Abdominal ultrasound July 2014 for abnormal LFTs Impression: Common bowel duct upper limits of normal caliber post cholecystectomy. No choledocholithiasis or intrahepatic dilation. Fatty liver. 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 The mass has not changed significantly in size. Some low intensity internal affect is most likely reflect debris. No papillary or definite solid nodular area was seen.

## 2013-08-19 NOTE — Interval H&P Note (Signed)
History and Physical Interval Note:  08/19/2013 8:48 AM  Mark Walls  has presented today for surgery, with the diagnosis of CHRONIC DIARRHEA, CHRONIC GERD, FATTY LIVER,EARLY SATIETY  The various methods of treatment have been discussed with the patient and family. After consideration of risks, benefits and other options for treatment, the patient has consented to  Procedure(s) with comments: COLONOSCOPY WITH ESOPHAGOGASTRODUODENOSCOPY (EGD) (N/A) - 8:30 as a surgical intervention .  The patient's history has been reviewed, patient examined, no change in status, stable for surgery.  I have reviewed the patient's chart and labs.  Questions were answered to the patient's satisfaction.     No Change. EGD and colonoscopy per plan.   The risks, benefits, limitations, imponderables and alternatives regarding both EGD and colonoscopy have been reviewed with the patient. Questions have been answered. All parties agreeable.    Mark Walls

## 2013-08-21 ENCOUNTER — Encounter (HOSPITAL_COMMUNITY): Payer: Self-pay | Admitting: Internal Medicine

## 2013-08-22 ENCOUNTER — Encounter: Payer: Self-pay | Admitting: Internal Medicine

## 2013-09-10 ENCOUNTER — Other Ambulatory Visit: Payer: Self-pay

## 2013-09-10 MED ORDER — CLOTRIMAZOLE-BETAMETHASONE 1-0.05 % EX LOTN: 1.0000 "application " | TOPICAL_LOTION | Freq: Two times a day (BID) | CUTANEOUS | Status: DC

## 2013-09-18 ENCOUNTER — Encounter: Payer: Self-pay | Admitting: Family Medicine

## 2013-09-18 ENCOUNTER — Ambulatory Visit (INDEPENDENT_AMBULATORY_CARE_PROVIDER_SITE_OTHER): Payer: Medicaid Other | Admitting: Family Medicine

## 2013-09-18 VITALS — BP 168/82 | HR 78 | Resp 18 | Ht 67.0 in | Wt 220.1 lb

## 2013-09-18 DIAGNOSIS — E1165 Type 2 diabetes mellitus with hyperglycemia: Secondary | ICD-10-CM

## 2013-09-18 DIAGNOSIS — R5381 Other malaise: Secondary | ICD-10-CM

## 2013-09-18 DIAGNOSIS — Z23 Encounter for immunization: Secondary | ICD-10-CM

## 2013-09-18 DIAGNOSIS — E785 Hyperlipidemia, unspecified: Secondary | ICD-10-CM

## 2013-09-18 DIAGNOSIS — B369 Superficial mycosis, unspecified: Secondary | ICD-10-CM

## 2013-09-18 DIAGNOSIS — Z9119 Patient's noncompliance with other medical treatment and regimen: Secondary | ICD-10-CM

## 2013-09-18 DIAGNOSIS — I10 Essential (primary) hypertension: Secondary | ICD-10-CM

## 2013-09-18 DIAGNOSIS — IMO0002 Reserved for concepts with insufficient information to code with codable children: Secondary | ICD-10-CM

## 2013-09-18 DIAGNOSIS — Z91199 Patient's noncompliance with other medical treatment and regimen due to unspecified reason: Secondary | ICD-10-CM

## 2013-09-18 LAB — CBC WITH DIFFERENTIAL/PLATELET
Eosinophils Absolute: 0.2 10*3/uL (ref 0.0–0.7)
Hemoglobin: 14.6 g/dL (ref 13.0–17.0)
Lymphocytes Relative: 35 % (ref 12–46)
Lymphs Abs: 2.1 10*3/uL (ref 0.7–4.0)
Neutro Abs: 3.3 10*3/uL (ref 1.7–7.7)
Neutrophils Relative %: 54 % (ref 43–77)
Platelets: 252 10*3/uL (ref 150–400)
RBC: 4.68 MIL/uL (ref 4.22–5.81)
WBC: 6 10*3/uL (ref 4.0–10.5)

## 2013-09-18 MED ORDER — TERBINAFINE HCL 1 % EX CREA: TOPICAL_CREAM | Freq: Two times a day (BID) | CUTANEOUS | Status: DC

## 2013-09-18 MED ORDER — KETOROLAC TROMETHAMINE 60 MG/2ML IJ SOLN
60.0000 mg | Freq: Once | INTRAMUSCULAR | Status: AC
  Administered 2013-09-18: 60 mg via INTRAMUSCULAR

## 2013-09-18 MED ORDER — SITAGLIP PHOS-METFORMIN HCL ER 50-1000 MG PO TB24: 2.0000 | ORAL_TABLET | Freq: Every day | ORAL | Status: DC

## 2013-09-18 MED ORDER — BENAZEPRIL-HYDROCHLOROTHIAZIDE 20-12.5 MG PO TABS: 1.0000 | ORAL_TABLET | Freq: Every day | ORAL | Status: DC

## 2013-09-18 MED ORDER — CANAGLIFLOZIN 300 MG PO TABS: 1.0000 | ORAL_TABLET | Freq: Every day | ORAL | Status: DC

## 2013-09-18 NOTE — Patient Instructions (Addendum)
F/u first week in [place] with medication please and meter  Call between visit if blood sugar stays high  Goal for fasting blood sugar ranges from 80 to 120 and 2 hours after any meal or at bedtime should be between 130 to 170.  Test once daily   Flu vaccine today   Blood sugar medication: Janumet xr  TWO once daily, also invokana 300mg  one daily  Blood pressure medication lotensin /hctz one daily  Terbifine cream for your rash  CMP and EGFR, HBA1C today and CBC  Toradol 60 mg iM today for back pain

## 2013-09-19 LAB — COMPLETE METABOLIC PANEL WITH GFR
AST: 53 U/L — ABNORMAL HIGH (ref 0–37)
Alkaline Phosphatase: 80 U/L (ref 39–117)
BUN: 12 mg/dL (ref 6–23)
GFR, Est Non African American: >89 mL/min
Glucose, Bld: 193 mg/dL — ABNORMAL HIGH (ref 70–99)
Total Bilirubin: 0.5 mg/dL (ref 0.3–1.2)

## 2013-09-19 LAB — HEMOGLOBIN A1C
Hgb A1c MFr Bld: 8 % — ABNORMAL HIGH (ref <5.7)
Mean Plasma Glucose: 183 mg/dL — ABNORMAL HIGH (ref <117)

## 2013-09-20 DIAGNOSIS — Z9119 Patient's noncompliance with other medical treatment and regimen: Secondary | ICD-10-CM | POA: Insufficient documentation

## 2013-09-20 DIAGNOSIS — Z91199 Patient's noncompliance with other medical treatment and regimen due to unspecified reason: Secondary | ICD-10-CM | POA: Insufficient documentation

## 2013-09-20 NOTE — Assessment & Plan Note (Signed)
Hyperlipidemia:Low fat diet discussed and encouraged.  Update lab needed

## 2013-09-20 NOTE — Assessment & Plan Note (Signed)
Slight improvement, however did not take medication as prescried, medications reviewed and importance of complying stressed

## 2013-09-20 NOTE — Assessment & Plan Note (Signed)
Uncontrolled toradol at visit

## 2013-09-20 NOTE — Assessment & Plan Note (Signed)
Intermittent flares of fungal infection  In groin requests cream for as needed use, no current flare reportedly. Educated re appropriate use and med sent in

## 2013-09-20 NOTE — Progress Notes (Signed)
Subjective:    Patient ID: Mark Walls, male    DOB: Oct 27, 1956, 57 y.o.   MRN: [id number]  HPI The PT is here for follow up and re-evaluation of chronic medical conditions, medication management and review of any available recent lab and radiology data.  Preventive health is updated, specifically  Cancer screening and Immunization.   Questions or concerns regarding consultations or procedures which the PT has had in the interim are  addressed. The PT denies any adverse reactions to current medications since the last visit.  There are no new concerns.  C/o uncontrolled back pain, requests toradol injection. 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 On questioning, he was absolutely non compliant with med regime propsed      Review of Systems See HPI Denies recent fever or chills. Denies sinus pressure, nasal congestion, ear pain or sore throat. Denies chest congestion, productive cough or wheezing. Denies chest pains, palpitations and leg swelling Denies abdominal pain, nausea, vomiting,diarrhea or constipation.   Denies dysuria, frequency, hesitancy or incontinence. Chronic  joint pain, swelling and limitation in mobility. Denies headaches, seizures,  Denies uncontrolled  depression, anxiety or insomnia. Denies skin break down or rash.        Objective:   Physical Exam  Patient alert and oriented and in no cardiopulmonary distress.  HEENT: No facial asymmetry, EOMI, no sinus tenderness,  oropharynx pink and moist.  Neck supple no adenopathy.Poor dentition  Chest: Clear to auscultation bilaterally.Decreased air entry  CVS: S1, S2 no murmurs, no S3.  ABD: Soft non tender. Bowel sounds normal.  Ext: No edema  MS: Decreased  ROM spine, shoulders, hips and knees.  Skin: Intact, no ulcerations or rash noted.  Psych: Good eye contact, normal affect.  Memory intact not anxious or depressed appearing.  CNS: CN 2-12 intact, power, tone and sensation normal throughout.       Assessment & Plan:

## 2013-09-20 NOTE — Assessment & Plan Note (Signed)
Uncontrolled, medical non compliance an ongoing problem

## 2013-09-20 NOTE — Assessment & Plan Note (Signed)
Ongoing non compliance as far as medication adherence for diabetes , hypertension and hyperlipiemia. Pt has had colonoscopy and eye exam since last visit , which is to his credit, attempts being made to change behavior in a positive way are slow

## 2013-10-23 ENCOUNTER — Encounter: Payer: Self-pay | Admitting: Family Medicine

## 2013-10-23 ENCOUNTER — Encounter (INDEPENDENT_AMBULATORY_CARE_PROVIDER_SITE_OTHER): Payer: Self-pay

## 2013-10-23 ENCOUNTER — Ambulatory Visit (INDEPENDENT_AMBULATORY_CARE_PROVIDER_SITE_OTHER): Payer: Medicaid Other | Admitting: Family Medicine

## 2013-10-23 VITALS — BP 144/76 | HR 82 | Resp 18 | Ht 67.0 in | Wt 218.0 lb

## 2013-10-23 DIAGNOSIS — IMO0002 Reserved for concepts with insufficient information to code with codable children: Secondary | ICD-10-CM

## 2013-10-23 DIAGNOSIS — F17200 Nicotine dependence, unspecified, uncomplicated: Secondary | ICD-10-CM

## 2013-10-23 DIAGNOSIS — K760 Fatty (change of) liver, not elsewhere classified: Secondary | ICD-10-CM

## 2013-10-23 DIAGNOSIS — E785 Hyperlipidemia, unspecified: Secondary | ICD-10-CM

## 2013-10-23 DIAGNOSIS — I10 Essential (primary) hypertension: Secondary | ICD-10-CM

## 2013-10-23 DIAGNOSIS — E1165 Type 2 diabetes mellitus with hyperglycemia: Secondary | ICD-10-CM

## 2013-10-23 DIAGNOSIS — E663 Overweight: Secondary | ICD-10-CM

## 2013-10-23 DIAGNOSIS — Z720 Tobacco use: Secondary | ICD-10-CM

## 2013-10-23 DIAGNOSIS — K7689 Other specified diseases of liver: Secondary | ICD-10-CM

## 2013-10-23 MED ORDER — GLIPIZIDE ER 5 MG PO TB24: 5.0000 mg | ORAL_TABLET | Freq: Every day | ORAL | Status: DC

## 2013-10-23 MED ORDER — BENAZEPRIL-HYDROCHLOROTHIAZIDE 10-12.5 MG PO TABS: ORAL_TABLET | ORAL | Status: DC

## 2013-10-23 NOTE — Progress Notes (Signed)
Subjective:    Patient ID: Mark Walls, male    DOB: May 06, 1956, 57 y.o.   MRN: [id number]  HPI The PT is here for follow up and re-evaluation of chronic medical conditions, medication management and review of any available recent lab and radiology data.  Preventive health is updated, specifically  Cancer screening and Immunization.   Pt kept overnight  at [facility] for acute GE The PT denies any adverse reactions to current medications since the last visit.  There are no new concerns.  There are no specific complaints   Smokes 10 cigarettes for the year, no alcohol  Blood sugar testing twice daily range is 144 to 313      Review of Systems See HPI Denies recent fever or chills. Denies sinus pressure, nasal congestion, ear pain or sore throat. Denies chest congestion, productive cough or wheezing. Denies chest pains, palpitations and leg swelling Denies abdominal pain, nausea, vomiting,diarrhea or constipation.   Denies dysuria, frequency, hesitancy or incontinence. Chronic  joint pain, swelling and limitation in mobility. Denies headaches, seizures, numbness, or tingling. Denies uncontrolled  depression, anxiety or insomnia. Denies skin break down or rash.        Objective:   Physical Exam Patient alert and oriented and in no cardiopulmonary distress.  HEENT: No facial asymmetry, EOMI, no sinus tenderness,  oropharynx pink and moist.  Neck supple no adenopathy.  Chest: Clear to auscultation bilaterally.  CVS: S1, S2 no murmurs, no S3.  ABD: Soft non tender. Bowel sounds normal.  Ext: No edema  MS: Adequate ROM spine, shoulders, hips and knees.  Skin: Intact, no ulcerations or rash noted.  Psych: Good eye contact, normal affect. Memory intact not anxious or depressed appearing.  CNS: CN 2-12 intact, power, tone and sensation normal throughout.        Assessment & Plan:

## 2013-10-23 NOTE — Assessment & Plan Note (Signed)
Uncontrolled, change to benazepril/HCTZ

## 2013-10-23 NOTE — Patient Instructions (Signed)
F/u end January, call if you need me before  New additional medication for blood sugar, glipizide one daily at breakfast.  STOP HCTZ for blood pressure , new is benazepril/HCTZ  Fasting lipid, cmp and EGFtr , hBA1c end January BEFORE visit  PLEASE bring ALL medication to next visit

## 2013-10-24 ENCOUNTER — Encounter: Payer: Self-pay | Admitting: Family Medicine

## 2013-10-24 DIAGNOSIS — Z720 Tobacco use: Secondary | ICD-10-CM | POA: Insufficient documentation

## 2013-10-24 DIAGNOSIS — K760 Fatty (change of) liver, not elsewhere classified: Secondary | ICD-10-CM | POA: Insufficient documentation

## 2013-10-24 NOTE — Assessment & Plan Note (Signed)
Has meter, with twice daily testing, blood sugar ranging from 140's to over 300. Add glipizide Patient advised to reduce carb and sweets, commit to regular physical activity, take meds as prescribed, test blood as directed, and attempt to lose weight, to improve blood sugar control.

## 2013-10-24 NOTE — Assessment & Plan Note (Signed)
Reports smoking one cigarette per month approximately, counseled re need to quit

## 2013-10-24 NOTE — Assessment & Plan Note (Signed)
Elevated LFT, fatty liver, needs to rely on dietary modification only at this time. Will add zetia if no better next blood test Hyperlipidemia:Low fat diet discussed and encouraged.

## 2013-10-24 NOTE — Assessment & Plan Note (Signed)
Improved. Pt applauded on succesful weight loss through lifestyle change, and encouraged to continue same. Weight loss goal set for the next several months.  

## 2013-12-04 ENCOUNTER — Telehealth: Payer: Self-pay

## 2013-12-04 ENCOUNTER — Telehealth: Payer: Self-pay | Admitting: *Deleted

## 2013-12-04 NOTE — Telephone Encounter (Signed)
Visit info 

## 2013-12-04 NOTE — Telephone Encounter (Signed)
He needs to continue  What he has been doing, no fever, if worsens needs to go to urgent care

## 2013-12-04 NOTE — Telephone Encounter (Signed)
Message given to wife who had the phone

## 2014-01-15 ENCOUNTER — Ambulatory Visit: Payer: Medicaid Other | Admitting: Family Medicine

## 2014-01-20 ENCOUNTER — Ambulatory Visit (INDEPENDENT_AMBULATORY_CARE_PROVIDER_SITE_OTHER): Payer: Medicaid Other | Admitting: Family Medicine

## 2014-01-20 ENCOUNTER — Encounter (INDEPENDENT_AMBULATORY_CARE_PROVIDER_SITE_OTHER): Payer: Self-pay

## 2014-01-20 ENCOUNTER — Other Ambulatory Visit: Payer: Self-pay | Admitting: Family Medicine

## 2014-01-20 VITALS — BP 160/96 | HR 76 | Temp 98.0°F | Resp 18 | Ht 67.0 in | Wt 218.0 lb

## 2014-01-20 DIAGNOSIS — E1165 Type 2 diabetes mellitus with hyperglycemia: Secondary | ICD-10-CM

## 2014-01-20 DIAGNOSIS — R748 Abnormal levels of other serum enzymes: Secondary | ICD-10-CM

## 2014-01-20 DIAGNOSIS — R0989 Other specified symptoms and signs involving the circulatory and respiratory systems: Secondary | ICD-10-CM

## 2014-01-20 DIAGNOSIS — A499 Bacterial infection, unspecified: Secondary | ICD-10-CM

## 2014-01-20 DIAGNOSIS — R0683 Snoring: Secondary | ICD-10-CM

## 2014-01-20 DIAGNOSIS — Z9119 Patient's noncompliance with other medical treatment and regimen: Secondary | ICD-10-CM

## 2014-01-20 DIAGNOSIS — F17200 Nicotine dependence, unspecified, uncomplicated: Secondary | ICD-10-CM

## 2014-01-20 DIAGNOSIS — J449 Chronic obstructive pulmonary disease, unspecified: Secondary | ICD-10-CM

## 2014-01-20 DIAGNOSIS — Z91199 Patient's noncompliance with other medical treatment and regimen due to unspecified reason: Secondary | ICD-10-CM

## 2014-01-20 DIAGNOSIS — R0609 Other forms of dyspnea: Secondary | ICD-10-CM

## 2014-01-20 DIAGNOSIS — J209 Acute bronchitis, unspecified: Secondary | ICD-10-CM

## 2014-01-20 DIAGNOSIS — IMO0001 Reserved for inherently not codable concepts without codable children: Secondary | ICD-10-CM

## 2014-01-20 DIAGNOSIS — Z720 Tobacco use: Secondary | ICD-10-CM

## 2014-01-20 DIAGNOSIS — J019 Acute sinusitis, unspecified: Secondary | ICD-10-CM

## 2014-01-20 DIAGNOSIS — E663 Overweight: Secondary | ICD-10-CM

## 2014-01-20 DIAGNOSIS — IMO0002 Reserved for concepts with insufficient information to code with codable children: Secondary | ICD-10-CM

## 2014-01-20 DIAGNOSIS — J208 Acute bronchitis due to other specified organisms: Principal | ICD-10-CM

## 2014-01-20 DIAGNOSIS — R945 Abnormal results of liver function studies: Secondary | ICD-10-CM

## 2014-01-20 DIAGNOSIS — R079 Chest pain, unspecified: Secondary | ICD-10-CM | POA: Insufficient documentation

## 2014-01-20 DIAGNOSIS — G4733 Obstructive sleep apnea (adult) (pediatric): Secondary | ICD-10-CM | POA: Insufficient documentation

## 2014-01-20 DIAGNOSIS — I10 Essential (primary) hypertension: Secondary | ICD-10-CM

## 2014-01-20 DIAGNOSIS — E785 Hyperlipidemia, unspecified: Secondary | ICD-10-CM

## 2014-01-20 DIAGNOSIS — R7989 Other specified abnormal findings of blood chemistry: Secondary | ICD-10-CM

## 2014-01-20 DIAGNOSIS — B9689 Other specified bacterial agents as the cause of diseases classified elsewhere: Secondary | ICD-10-CM

## 2014-01-20 DIAGNOSIS — I739 Peripheral vascular disease, unspecified: Secondary | ICD-10-CM

## 2014-01-20 MED ORDER — CEFTRIAXONE SODIUM 1 G IJ SOLR
500.0000 mg | Freq: Once | INTRAMUSCULAR | Status: AC
  Administered 2014-01-20: 500 mg via INTRAMUSCULAR

## 2014-01-20 MED ORDER — PENICILLIN V POTASSIUM 500 MG PO TABS: 500.0000 mg | ORAL_TABLET | Freq: Three times a day (TID) | ORAL | Status: DC

## 2014-01-20 MED ORDER — ALBUTEROL SULFATE HFA 108 (90 BASE) MCG/ACT IN AERS: 2.0000 | INHALATION_SPRAY | Freq: Four times a day (QID) | RESPIRATORY_TRACT | Status: DC | PRN

## 2014-01-20 MED ORDER — IPRATROPIUM BROMIDE 0.02 % IN SOLN
0.5000 mg | Freq: Once | RESPIRATORY_TRACT | Status: AC
  Administered 2014-01-20: 0.5 mg via RESPIRATORY_TRACT

## 2014-01-20 MED ORDER — ALBUTEROL SULFATE (2.5 MG/3ML) 0.083% IN NEBU
2.5000 mg | INHALATION_SOLUTION | Freq: Once | RESPIRATORY_TRACT | Status: AC
  Administered 2014-01-20: 2.5 mg via RESPIRATORY_TRACT

## 2014-01-20 MED ORDER — BUDESONIDE-FORMOTEROL FUMARATE 80-4.5 MCG/ACT IN AERO: 2.0000 | INHALATION_SPRAY | Freq: Two times a day (BID) | RESPIRATORY_TRACT | Status: DC

## 2014-01-20 MED ORDER — BENZONATATE 100 MG PO CAPS: 100.0000 mg | ORAL_CAPSULE | Freq: Two times a day (BID) | ORAL | Status: DC | PRN

## 2014-01-20 MED ORDER — BENAZEPRIL-HYDROCHLOROTHIAZIDE 20-12.5 MG PO TABS: 1.0000 | ORAL_TABLET | Freq: Every day | ORAL | Status: DC

## 2014-01-20 NOTE — Assessment & Plan Note (Addendum)
Uncontrolled, needs to take meds, has been non compliant DASH diet and commitment to daily physical activity for a minimum of 30 minutes discussed and encouraged, as a part of hypertension management. The importance of attaining a healthy weight is also discussed.

## 2014-01-20 NOTE — Patient Instructions (Addendum)
F/u in 6 weeks  Blood pressure is high, FILL script TODAY for benazepril/HCTZ  You are referred for evaluation by cardiology, re circulation in legs and chest pain with fatigue  You are referred to lung specialist to evaluate you for sleep disturbance , possible sleep apnea  You are referred for lung function test  You are treated today for acute bronchitis, Rocephin in office and med sent in, also neb treatment in office  Labs today cmp and EGFR, lipid, HBA1C  You NEED to STOP red meat , fried food and cheese, all those increase your risk of heart disease  NEED to take ASPIRIN 81 mg one daily

## 2014-01-21 ENCOUNTER — Encounter: Payer: Self-pay | Admitting: Cardiology

## 2014-01-21 ENCOUNTER — Ambulatory Visit (INDEPENDENT_AMBULATORY_CARE_PROVIDER_SITE_OTHER): Payer: Medicaid Other | Admitting: Cardiology

## 2014-01-21 VITALS — BP 147/73 | HR 68 | Ht 67.0 in | Wt 217.8 lb

## 2014-01-21 DIAGNOSIS — R079 Chest pain, unspecified: Secondary | ICD-10-CM

## 2014-01-21 DIAGNOSIS — I10 Essential (primary) hypertension: Secondary | ICD-10-CM

## 2014-01-21 DIAGNOSIS — R0609 Other forms of dyspnea: Secondary | ICD-10-CM

## 2014-01-21 DIAGNOSIS — R0989 Other specified symptoms and signs involving the circulatory and respiratory systems: Secondary | ICD-10-CM

## 2014-01-21 DIAGNOSIS — M79609 Pain in unspecified limb: Secondary | ICD-10-CM

## 2014-01-21 DIAGNOSIS — M79606 Pain in leg, unspecified: Secondary | ICD-10-CM

## 2014-01-21 DIAGNOSIS — R0600 Dyspnea, unspecified: Secondary | ICD-10-CM

## 2014-01-21 LAB — LIPID PANEL
Cholesterol: 290 mg/dL — ABNORMAL HIGH (ref 0–200)
HDL: 37 mg/dL — ABNORMAL LOW (ref >39)
LDL Cholesterol: 199 mg/dL — ABNORMAL HIGH (ref 0–99)
Total CHOL/HDL Ratio: 7.8 Ratio
Triglycerides: 271 mg/dL — ABNORMAL HIGH (ref <150)
VLDL: 54 mg/dL — ABNORMAL HIGH (ref 0–40)

## 2014-01-21 LAB — COMPLETE METABOLIC PANEL WITH GFR
ALT: 134 U/L — ABNORMAL HIGH (ref 0–53)
AST: 69 U/L — ABNORMAL HIGH (ref 0–37)
Albumin: 4.3 g/dL (ref 3.5–5.2)
Alkaline Phosphatase: 67 U/L (ref 39–117)
BUN: 11 mg/dL (ref 6–23)
CO2: 27 mEq/L (ref 19–32)
Calcium: 10.2 mg/dL (ref 8.4–10.5)
Chloride: 104 mEq/L (ref 96–112)
Creat: 0.69 mg/dL (ref 0.50–1.35)
GFR, Est African American: >89 mL/min
GFR, Est Non African American: >89 mL/min
Glucose, Bld: 120 mg/dL — ABNORMAL HIGH (ref 70–99)
Potassium: 4.1 mEq/L (ref 3.5–5.3)
Sodium: 139 mEq/L (ref 135–145)
Total Bilirubin: 0.5 mg/dL (ref 0.2–1.2)
Total Protein: 7.2 g/dL (ref 6.0–8.3)

## 2014-01-21 LAB — HEPATITIS PANEL, ACUTE
HCV Ab: NEGATIVE
HEP A IGM: NONREACTIVE
HEP B S AG: NEGATIVE
Hep B C IgM: NONREACTIVE

## 2014-01-21 LAB — HEMOGLOBIN A1C
HEMOGLOBIN A1C: 7.9 % — AB (ref <5.7)
Mean Plasma Glucose: 180 mg/dL — ABNORMAL HIGH (ref <117)

## 2014-01-21 NOTE — Progress Notes (Signed)
Clinical Summary Mr. Curling is a 58 y.o.male seen today as a new patient.   1. Chest pain - started approx 2 years ago. Sharp pain right arm and into midchest, 10/10. +diaphoresis. No SOB, no palpitations. Nothing makes worst. Can occur at rest or with exertion. Occurs every few months, last time was late August. Episodes have become more severe, no change in frequency. Symptoms lasts up to just a few minutes - Sedentary lifestyle, + DOE with activities which is new.   CAD risk factors: DM, HTN, HL, former tobacco, PAD with prior intervention. No FH of heart disease  2. Hyperlipidemia - elevated LFTs, fatty liver per PCP notes. Not on statin.   3. HTN - does not check at home - compliant with meds -reports recent change in bp meds, has not started yet.   4. PAD - prior stent placed 2005, right common iliac - feeling of leg pain with walking, pain at 1.5 blocks. Lower back pain, bilateral knee pain, and bilateral calf pain.  - MRI with lumbar disase, foraminal impingement - muscle pain   5. COPD - compliant with inhalers Past Medical History  Diagnosis Date  . PVD (peripheral vascular disease)   . ED (erectile dysfunction)   . Anxiety   . Depression   . Obesity   . Chronic fatigue   . Nicotine addiction   . Diabetes mellitus, type 2   . Hyperlipidemia   . Hypertension      Allergies  Allergen Reactions  . Allopurinol Nausea And Vomiting  . Levofloxacin Nausea And Vomiting  . Statins Other (See Comments)    Fatty liver, markedly elevated LFT  . Sulfamethoxazole-Trimethoprim Nausea And Vomiting  . Codeine Rash     Current Outpatient Prescriptions  Medication Sig Dispense Refill  . albuterol (PROVENTIL HFA;VENTOLIN HFA) 108 (90 BASE) MCG/ACT inhaler Inhale 2 puffs into the lungs every 6 (six) hours as needed for wheezing or shortness of breath.  1 Inhaler  2  . ALPRAZolam (XANAX) 1 MG tablet Take 1 mg by mouth 4 (four) times daily as needed for anxiety.       Marland Kitchen amphetamine-dextroamphetamine (ADDERALL) 10 MG tablet Take 10 mg by mouth 3 (three) times daily.       . beclomethasone (QVAR) 40 MCG/ACT inhaler Inhale 2 puffs into the lungs 2 (two) times daily.      . benazepril-hydrochlorthiazide (LOTENSIN HCT) 20-12.5 MG per tablet Take 1 tablet by mouth daily.  30 tablet  5  . benzonatate (TESSALON) 100 MG capsule Take 1 capsule (100 mg total) by mouth 2 (two) times daily as needed for cough.  20 capsule  0  . budesonide-formoterol (SYMBICORT) 80-4.5 MCG/ACT inhaler Inhale 2 puffs into the lungs 2 (two) times daily.  1 Inhaler  12  . glipiZIDE (GLUCOTROL XL) 5 MG 24 hr tablet Take 1 tablet (5 mg total) by mouth daily with breakfast.  30 tablet  3  . penicillin v potassium (VEETID) 500 MG tablet Take 1 tablet (500 mg total) by mouth 3 (three) times daily.  21 tablet  0  . SitaGLIPtin-MetFORMIN HCl 50-1000 MG TB24 Take 2 tablets by mouth daily.  60 tablet  5  . vardenafil (LEVITRA) 20 MG tablet Take 20 mg by mouth daily as needed.         No current facility-administered medications for this visit.     Past Surgical History  Procedure Laterality Date  . Cholecystectomy    . Polpectomy colon    .  Esophagogastroduodenoscopy  09/24/2002    [name]:gastritis of the antrum and the bodt of the stomach otherwise normal  . Left knee athroscopy  03/2008  . Rotator cuff repair  09/01/08  . Right elbow    . Left hand    . Colonoscopy  10/01/2002    [name]:small internal hemorrhoids/single small sessile polyp in the rectum/ otherwise normal. Hyperplastic polyp.  . Right common iliac artery stenting    . Colonoscopy with esophagogastroduodenoscopy (egd) N/A 08/19/2013    Procedure: COLONOSCOPY WITH ESOPHAGOGASTRODUODENOSCOPY (EGD);  Surgeon: Daneil Dolin, MD;  Location: [facility];  Service: Endoscopy;  Laterality: N/A;  8:30     Allergies  Allergen Reactions  . Allopurinol Nausea And Vomiting  . Levofloxacin Nausea And Vomiting  . Statins Other (See  Comments)    Fatty liver, markedly elevated LFT  . Sulfamethoxazole-Trimethoprim Nausea And Vomiting  . Codeine Rash      Family History  Problem Relation Age of Onset  . Cirrhosis Mother   . Melanoma Mother   . Mental illness Brother   . Mental illness Father     suicide   . Melanoma Brother   . Mental illness Brother   . Diabetes Brother   . Hypertension Brother   . Diabetes Brother   . Hypertension Brother   . Colon cancer Paternal Grandfather     age 22     Social History Mr. Borsuk reports that he has been smoking Cigarettes.  He has been smoking about 1.00 pack per day. He does not have any smokeless tobacco history on file. Mr. Lipton reports that he does not drink alcohol.   Review of Systems CONSTITUTIONAL: No weight loss, fever, chills, weakness or fatigue.  HEENT: Eyes: No visual loss, blurred vision, double vision or yellow sclerae.No hearing loss, sneezing, congestion, runny nose or sore throat.  SKIN: No rash or itching.  CARDIOVASCULAR: per HPI RESPIRATORY: No shortness of breath, cough or sputum.  GASTROINTESTINAL: No anorexia, nausea, vomiting or diarrhea. No abdominal pain or blood.  GENITOURINARY: No burning on urination, no polyuria NEUROLOGICAL: No headache, dizziness, syncope, paralysis, ataxia, numbness or tingling in the extremities. No change in bowel or bladder control.  MUSCULOSKELETAL: No muscle, back pain, joint pain or stiffness.  LYMPHATICS: No enlarged nodes. No history of splenectomy.  PSYCHIATRIC: No history of depression or anxiety.  ENDOCRINOLOGIC: No reports of sweating, cold or heat intolerance. No polyuria or polydipsia.  Marland Kitchen   Physical Examination p 68 bp 147/73 Wt 217 lbs BMI 34 Gen: resting comfortably, no acute distress HEENT: no scleral icterus, pupils equal round and reactive, no palptable cervical adenopathy,  CV: RRR, no m/r/g, no JVD, no carotid bruits, 2+ DP pulses bilterally Resp: Clear to auscultation  bilaterally GI: abdomen is soft, non-tender, non-distended, normal bowel sounds, no hepatosplenomegaly MSK: extremities are warm, no edema.  Skin: warm, no rash Neuro:  no focal deficits Psych: appropriate affect   Diagnostic Studies 11/2013 CXR No abnormality  11/2013 Labs: Cr 0.8 K 4.4 Hgb 13.9 Plt 214   2005 Cath FINDINGS:  1. LV: 136/10/19. EF 65% without regional wall motion abnormal.  2. No aortic stenosis or mitral regurgitation.  3. Left main: Angiographically normal.  4. LAD: Moderate-sized vessel giving rise to three diagonal branches.  First two diagonals are quite small and the third is larger. The LAD  and its branches are normal.  5. Circumflex: Large, codominant vessel giving rise of two obtuse  marginals in the PDA. It is angiographically  normal. 6. RCA: Moderate-  sized, codominant vessel. It is angiographically normal.  6. Abdominal aorta: Normal abdominal aorta. It gives rise to single renal  arteries bilaterally. Both of these are normal. 8. Right leg: Severe  common iliac stenosis with peak gradient of 40 mmHg and a mean gradient  of 15 mmHg at rest. The remainder of the common iliac as well as the  internal iliac, external iliac, common femoral, and proximal portions of  the SFA and profunda are normal.  7. Left leg: There is a mild stenosis of the common iliac artery  (approximately 20%). The remainder of the common as well as the  internal iliac, external iliac, common femoral and proximal portion of  the SFA is normal. There is an approximately 50% stenosis of the  proximal profunda.  IMPRESSION/RECOMMENDATIONS:  1. Angiographically normal coronary arteries with normal LV size and  systolic function. No aortic stenosis or mitral regurgitation. I  suspect a non-cardiac etiology to his chest pain. Despite these  negative findings, his atherosclerotic peripheral vascular disease  places him at high risk for subsequent coronary event. Thus, aggressive   secondary prevention efforts should be continued.  2. 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 Stenting is recommended.  Assessment and Plan  1. Chest pain - multiple CAD risk factors, will obtain exercise nuclear stress test and echo  2. Leg pain/PAD - history of PAD, will obtain ABI  3. HTN - reports recent change in bp meds by is PCP, will follow pressures.   4. Hyperlipidemia - history of fatty liver with elevated LFTs, he has previously not been on statin. Will not start.    Follow up 1 month   Arnoldo Lenis, M.D., F.A.C.C.

## 2014-01-21 NOTE — Patient Instructions (Signed)
Your physician recommends that you schedule a follow-up appointment in: 1 month with Dr. Harl Bowie. This appointment will be scheduled today before you leave.  Your physician recommends that you continue on your current medications as directed. Please refer to the Current Medication list given to you today.  Your physician has requested that you have an echocardiogram. Echocardiography is a painless test that uses sound waves to create images of your heart. It provides your doctor with information about the size and shape of your heart and how well your heart's chambers and valves are working. This procedure takes approximately one hour. There are no restrictions for this procedure.  Your physician has requested that you have an ankle brachial index (ABI). During this test an ultrasound and blood pressure cuff are used to evaluate the arteries that supply the arms and legs with blood. Allow thirty minutes for this exam. There are no restrictions or special instructions.

## 2014-01-22 NOTE — Assessment & Plan Note (Addendum)
Unchanged and still uncontrolled, due to non compliance with medication Patient advised to reduce carb and sweets, commit to regular physical activity, take meds as prescribed, test blood as directed, and attempt to lose weight, to improve blood sugar control.

## 2014-01-27 ENCOUNTER — Encounter (HOSPITAL_COMMUNITY)
Admission: RE | Admit: 2014-01-27 | Discharge: 2014-01-27 | Disposition: A | Discharge status: Home / Self Care | Payer: Medicaid Other | Source: Ambulatory Visit | Attending: Cardiology | Admitting: Cardiology

## 2014-01-27 ENCOUNTER — Encounter (HOSPITAL_COMMUNITY): Payer: Self-pay

## 2014-01-27 ENCOUNTER — Other Ambulatory Visit: Payer: Self-pay

## 2014-01-27 DIAGNOSIS — I739 Peripheral vascular disease, unspecified: Secondary | ICD-10-CM | POA: Insufficient documentation

## 2014-01-27 DIAGNOSIS — E785 Hyperlipidemia, unspecified: Secondary | ICD-10-CM | POA: Insufficient documentation

## 2014-01-27 DIAGNOSIS — I10 Essential (primary) hypertension: Secondary | ICD-10-CM | POA: Insufficient documentation

## 2014-01-27 DIAGNOSIS — R079 Chest pain, unspecified: Secondary | ICD-10-CM

## 2014-01-27 DIAGNOSIS — R0789 Other chest pain: Secondary | ICD-10-CM | POA: Insufficient documentation

## 2014-01-27 MED ORDER — TECHNETIUM TC 99M SESTAMIBI GENERIC - CARDIOLITE
10.0000 | Freq: Once | INTRAVENOUS | Status: AC | PRN
  Administered 2014-01-27: 10 via INTRAVENOUS

## 2014-01-27 MED ORDER — REGADENOSON 0.4 MG/5ML IV SOLN
INTRAVENOUS | Status: AC
  Administered 2014-01-27: 0.4 mg via INTRAVENOUS
  Filled 2014-01-27: qty 5

## 2014-01-27 MED ORDER — SODIUM CHLORIDE 0.9 % IJ SOLN
INTRAMUSCULAR | Status: AC
  Administered 2014-01-27: 10 mL via INTRAVENOUS
  Filled 2014-01-27: qty 10

## 2014-01-27 MED ORDER — CANAGLIFLOZIN 300 MG PO TABS: 1.0000 | ORAL_TABLET | Freq: Every day | ORAL | Status: DC

## 2014-01-27 MED ORDER — TECHNETIUM TC 99M SESTAMIBI - CARDIOLITE
30.0000 | Freq: Once | INTRAVENOUS | Status: AC | PRN
  Administered 2014-01-27: 10:00:00 30 via INTRAVENOUS

## 2014-01-27 NOTE — Progress Notes (Signed)
[name]  [name] 01/27/2014 Reason for doing test: Chest Pain and Dyspnea Type of test: Test Changed unable to finish on TM, Lexiscan given Nurse performing test: Gerrit Halls, RN Nuclear Medicine Tech: Melburn Hake Echo Tech: Not Applicable MD performing test: S. McDowell/K.Lawrence NP Family MD: Moshe Cipro Test explained and consent signed: yes IV started: 22g jelco, Saline lock flushed, No redness or edema and Saline lock started in radiology Symptoms: Fatigue, SOB & knee pain Treatment/Intervention: None Reason test stopped: protocol completed After recovery IV was: Discontinued via X-ray tech and No redness or edema Patient to return to [facility]. Med at : 10:30 Patient discharged: Home Patient's Condition upon discharge was: stable Comments: During test BP 182/72 & HR 101.  Recovery BP 110/65 & HR 71.  Symptoms resolved in recovery. Geanie Cooley T

## 2014-01-28 ENCOUNTER — Other Ambulatory Visit (INDEPENDENT_AMBULATORY_CARE_PROVIDER_SITE_OTHER): Payer: Medicaid Other

## 2014-01-28 ENCOUNTER — Other Ambulatory Visit: Payer: Self-pay

## 2014-01-28 DIAGNOSIS — R0600 Dyspnea, unspecified: Secondary | ICD-10-CM

## 2014-01-28 DIAGNOSIS — R0609 Other forms of dyspnea: Secondary | ICD-10-CM

## 2014-01-28 DIAGNOSIS — R0989 Other specified symptoms and signs involving the circulatory and respiratory systems: Secondary | ICD-10-CM

## 2014-01-28 DIAGNOSIS — R079 Chest pain, unspecified: Secondary | ICD-10-CM

## 2014-01-29 ENCOUNTER — Encounter (INDEPENDENT_AMBULATORY_CARE_PROVIDER_SITE_OTHER): Payer: Medicaid Other

## 2014-01-29 DIAGNOSIS — M79606 Pain in leg, unspecified: Secondary | ICD-10-CM

## 2014-01-29 DIAGNOSIS — I739 Peripheral vascular disease, unspecified: Secondary | ICD-10-CM

## 2014-01-30 ENCOUNTER — Telehealth: Payer: Self-pay | Admitting: *Deleted

## 2014-01-30 NOTE — Telephone Encounter (Signed)
Patient's wife informed

## 2014-01-30 NOTE — Telephone Encounter (Signed)
Message copied by Merlene Laughter on Fri Jan 30, 2014  3:39 PM ------      Message from: Mark Walls F      Created: Fri Jan 30, 2014  3:01 PM       Please let patient know that recent test shows that circulation in legs is good            Mark Dolly MD ------

## 2014-01-30 NOTE — Telephone Encounter (Signed)
Message copied by Merlene Laughter on Fri Jan 30, 2014  3:46 PM ------      Message from: [place] F      Created: Thu Jan 29, 2014 10:41 AM       Please let patient know that no significant abnormalities on his echo            Carlyle Dolly MD ------

## 2014-01-30 NOTE — Telephone Encounter (Signed)
Message copied by Merlene Laughter on Fri Jan 30, 2014  3:43 PM ------      Message from: [place] F      Created: Wed Jan 28, 2014  9:20 PM       Please let patient know that overall there were no significant abnormalities on his stress test. There may be small area of the heart affected by a blockage, but this is something that we will continue to manage with medication. Will discuss further at his next follow up appointment.            Carlyle Dolly MD ------

## 2014-02-02 ENCOUNTER — Inpatient Hospital Stay (HOSPITAL_COMMUNITY): Admission: RE | Admit: 2014-02-02 | Payer: Medicaid Other | Source: Ambulatory Visit

## 2014-02-04 ENCOUNTER — Encounter: Payer: Self-pay | Admitting: Family Medicine

## 2014-02-04 ENCOUNTER — Telehealth: Payer: Self-pay

## 2014-02-04 ENCOUNTER — Ambulatory Visit: Payer: Medicaid Other | Admitting: Family Medicine

## 2014-02-04 DIAGNOSIS — J019 Acute sinusitis, unspecified: Secondary | ICD-10-CM | POA: Insufficient documentation

## 2014-02-04 MED ORDER — EZETIMIBE 10 MG PO TABS: 10.0000 mg | ORAL_TABLET | Freq: Every day | ORAL | Status: DC

## 2014-02-04 NOTE — Assessment & Plan Note (Signed)
Antibiotic and decongestant prescribed 

## 2014-02-04 NOTE — Assessment & Plan Note (Signed)
Continues to be non compliant with medication adherence in particular, which is a real challenge, re education attempted at this visit

## 2014-02-04 NOTE — Assessment & Plan Note (Signed)
Excessive snoring with daytime sleepiness and chronic fatigue, Refer to pulmonary for further eval

## 2014-02-04 NOTE — Assessment & Plan Note (Signed)
Antibiotics and decongestants prescribed medication also administered at office visit.    

## 2014-02-04 NOTE — Assessment & Plan Note (Signed)
Increasing dyspnea, despite smoking cessation, needs to commit to daily use of inhalers, needs PFT for reassesment

## 2014-02-04 NOTE — Progress Notes (Signed)
Subjective:    Patient ID: Mark Walls, male    DOB: 07/08/1956, 58 y.o.   MRN: [id number]  HPI 1 week h/o worsening head and chest congestion, associated with fever and chills intermittently. Nasal drainage has thickened , and is yellowish green, and at times bloody. Sputum is thick and yellow. C/o bilateral ear pressure, denies hearing loss and sore throat. Increasing fatigue , poor appetitie and sleep disturbed by cough. No improvement with OTC medication.     Review of Systems See HPI  6 month h/o increased fatigue with minimal activity, intermittent chest pain at times associated with activity, denies palpitations,has difficulty lying flat and uses 2 pillows Denies abdominal pain, nausea, vomiting,diarrhea or constipation.   Denies dysuria, frequency, hesitancy or incontinence. Chronic back pain and generalized joint pain and limitation in mobility. Denies headaches, seizures, numbness, or tingling. Denies depression, anxiety or insomnia. Denies skin break down or rash.        Objective:   Physical Exam BP 160/96  Pulse 76  Temp(Src) 98 F (36.7 C)  Resp 18  Ht 5\' 7"  (1.702 m)  Wt 218 lb 0.6 oz (98.902 kg)  BMI 34.14 kg/m2  SpO2 96%  Patient alert and oriented and in no cardiopulmonary distress.  HEENT: No facial asymmetry, EOMI, frontal and maxillary sinus tenderness,  oropharynx pink and moist.  Neck supple no adenopathy.TM clear bilaterally  Chest: decreased air entry throughout, bilateral crackles and wheezes CVS: S1, S2 no murmurs, no S3.  ABD: Soft non tender. Bowel sounds normal.  Ext: No edema  MS: decreased ROM spine, normal ROM shoulders, hips and knees  Skin: Intact, no ulcerations or rash noted.  Psych: Good eye contact, normal affect. Memory intact not anxious or depressed appearing.  CNS: CN 2-12 intact, power, tone and sensation normal throughout.       Assessment & Plan:  HYPERTENSION Uncontrolled, needs to take meds, has  been non compliant DASH diet and commitment to daily physical activity for a minimum of 30 minutes discussed and encouraged, as a part of hypertension management. The importance of attaining a healthy weight is also discussed.   Type 2 diabetes mellitus, uncontrolled Unchanged and still uncontrolled, due to non compliance with medication Patient advised to reduce carb and sweets, commit to regular physical activity, take meds as prescribed, test blood as directed, and attempt to lose weight, to improve blood sugar control.    Acute bacterial bronchitis Antibiotics and decongestants prescribed medication also administered at office visit.    Snoring Excessive snoring with daytime sleepiness and chronic fatigue, Refer to pulmonary for further eval  Acute sinusitis Antibiotic and decongestant prescribed.    COPD (chronic obstructive pulmonary disease) Increasing dyspnea, despite smoking cessation, needs to commit to daily use of inhalers, needs PFT for reassesment  Medically noncompliant Continues to be non compliant with medication adherence in particular, which is a real challenge, re education attempted at this visit  OVERWEIGHT Deteriorated. Patient re-educated about  the importance of commitment to a  minimum of 150 minutes of exercise per week. The importance of healthy food choices with portion control discussed. Encouraged to start a food diary, count calories and to consider  joining a support group. Sample diet sheets offered. Goals set by the patient for the next several months.     HYPERLIPIDEMIA Markedly elevated, liver enz preclude ability to use statin, will try zetia also Hyperlipidemia:Low fat diet discussed and encouraged. Worsening liver enzymes, no alcohol use , needs re veal by  GI    Abnormal LFTs Progressively worsening LFT, needs to have re eval by GI, denies alcohol but his chronic pain med through ortho has tylenol (states percocet) will send msg  to ortho asking for revision of pain med based on abn LFT, pt is aware

## 2014-02-04 NOTE — Assessment & Plan Note (Signed)
Markedly elevated, liver enz preclude ability to use statin, will try zetia also Hyperlipidemia:Low fat diet discussed and encouraged. Worsening liver enzymes, no alcohol use , needs re veal by GI

## 2014-02-04 NOTE — Telephone Encounter (Signed)
PA form filled out and given to Dr to sign

## 2014-02-04 NOTE — Telephone Encounter (Signed)
Zetia non-preferred. Preferred meds are atorvastatin, pravastatin, lovastatin and simvastatin

## 2014-02-04 NOTE — Assessment & Plan Note (Signed)
Deteriorated. Patient re-educated about  the importance of commitment to a  minimum of 150 minutes of exercise per week. The importance of healthy food choices with portion control discussed. Encouraged to start a food diary, count calories and to consider  joining a support group. Sample diet sheets offered. Goals set by the patient for the next several months.    

## 2014-02-04 NOTE — Telephone Encounter (Signed)
pls try and PA he has elevated lFT cannot take a statin

## 2014-02-04 NOTE — Assessment & Plan Note (Signed)
Progressively worsening LFT, needs to have re eval by GI, denies alcohol but his chronic pain med through ortho has tylenol (states percocet) will send msg to ortho asking for revision of pain med based on abn LFT, pt is aware

## 2014-02-05 ENCOUNTER — Encounter: Payer: Self-pay | Admitting: Internal Medicine

## 2014-02-09 ENCOUNTER — Encounter (HOSPITAL_COMMUNITY): Payer: Medicaid Other

## 2014-02-11 ENCOUNTER — Institutional Professional Consult (permissible substitution): Payer: Medicaid Other | Admitting: Pulmonary Disease

## 2014-02-18 ENCOUNTER — Telehealth: Payer: Self-pay | Admitting: Gastroenterology

## 2014-02-18 ENCOUNTER — Ambulatory Visit: Payer: Medicaid Other | Admitting: Gastroenterology

## 2014-02-18 ENCOUNTER — Encounter: Payer: Medicaid Other | Admitting: Cardiology

## 2014-02-18 NOTE — Progress Notes (Signed)
Encounter opened in error

## 2014-02-18 NOTE — Telephone Encounter (Signed)
Pt called earlier and spoke with GW. His car had broke down and he was told if it ran over 15 minutes late that we would need to [facility] his OV.

## 2014-02-23 ENCOUNTER — Ambulatory Visit (HOSPITAL_COMMUNITY): Admission: RE | Admit: 2014-02-23 | Payer: Medicaid Other | Source: Ambulatory Visit

## 2014-03-09 ENCOUNTER — Institutional Professional Consult (permissible substitution): Payer: Medicaid Other | Admitting: Pulmonary Disease

## 2014-03-10 ENCOUNTER — Ambulatory Visit: Payer: Medicaid Other | Admitting: Gastroenterology

## 2014-03-10 ENCOUNTER — Encounter: Payer: Medicaid Other | Admitting: Cardiology

## 2014-03-10 ENCOUNTER — Telehealth: Payer: Self-pay | Admitting: Gastroenterology

## 2014-03-10 ENCOUNTER — Encounter: Payer: Self-pay | Admitting: Cardiology

## 2014-03-10 NOTE — Telephone Encounter (Signed)
Pt was a no show

## 2014-03-10 NOTE — Telephone Encounter (Signed)
Please make referring provider aware of no show.

## 2014-03-10 NOTE — Progress Notes (Signed)
Encounter opened in error

## 2014-03-11 ENCOUNTER — Encounter: Payer: Self-pay | Admitting: Gastroenterology

## 2014-03-11 NOTE — Telephone Encounter (Signed)
Mailed letter °

## 2014-03-23 ENCOUNTER — Other Ambulatory Visit: Payer: Self-pay | Admitting: Family Medicine

## 2014-03-30 ENCOUNTER — Institutional Professional Consult (permissible substitution): Payer: Medicaid Other | Admitting: Pulmonary Disease

## 2014-03-30 ENCOUNTER — Telehealth: Payer: Self-pay

## 2014-03-30 DIAGNOSIS — Z79899 Other long term (current) drug therapy: Secondary | ICD-10-CM | POA: Insufficient documentation

## 2014-04-15 ENCOUNTER — Institutional Professional Consult (permissible substitution): Payer: Medicaid Other | Admitting: Pulmonary Disease

## 2014-04-21 ENCOUNTER — Telehealth: Payer: Self-pay | Admitting: Family Medicine

## 2014-04-21 DIAGNOSIS — E785 Hyperlipidemia, unspecified: Secondary | ICD-10-CM

## 2014-04-21 DIAGNOSIS — IMO0002 Reserved for concepts with insufficient information to code with codable children: Secondary | ICD-10-CM

## 2014-04-21 DIAGNOSIS — E1165 Type 2 diabetes mellitus with hyperglycemia: Secondary | ICD-10-CM

## 2014-04-21 NOTE — Telephone Encounter (Signed)
pls let pt know he dOES need fasting  HBa1C , lipid, cmp and EGFR no later than June 1, his appt with pulmonary is 5/21

## 2014-04-21 NOTE — Addendum Note (Signed)
Addended by: Eual Fines on: 04/21/2014 02:49 PM   Modules accepted: Orders

## 2014-04-21 NOTE — Telephone Encounter (Signed)
Patients wife aware and will mail the lab order with instructions on when to go

## 2014-04-21 NOTE — Telephone Encounter (Signed)
noted 

## 2014-04-29 ENCOUNTER — Institutional Professional Consult (permissible substitution): Payer: Medicaid Other | Admitting: Pulmonary Disease

## 2014-04-30 ENCOUNTER — Institutional Professional Consult (permissible substitution): Payer: Medicaid Other | Admitting: Pulmonary Disease

## 2014-05-07 ENCOUNTER — Institutional Professional Consult (permissible substitution): Payer: Medicaid Other | Admitting: Pulmonary Disease

## 2014-05-25 ENCOUNTER — Other Ambulatory Visit: Payer: Self-pay | Admitting: Family Medicine

## 2014-05-25 ENCOUNTER — Telehealth: Payer: Self-pay

## 2014-05-25 DIAGNOSIS — E785 Hyperlipidemia, unspecified: Secondary | ICD-10-CM

## 2014-05-25 DIAGNOSIS — IMO0002 Reserved for concepts with insufficient information to code with codable children: Secondary | ICD-10-CM

## 2014-05-25 DIAGNOSIS — E1165 Type 2 diabetes mellitus with hyperglycemia: Secondary | ICD-10-CM

## 2014-05-25 NOTE — Telephone Encounter (Signed)
Pt is statin intolerant , we may try fenofibrate instead, he needs fasting lipid, cmp and EGFR, hBa1c they are due, after I see the result will know what strength, he needs appt scheduled also cancelled last one and none in system

## 2014-05-26 NOTE — Addendum Note (Signed)
Addended by: Denman George B on: 05/26/2014 02:21 PM   Modules accepted: Orders

## 2014-05-26 NOTE — Telephone Encounter (Signed)
Patient aware.  Will have fasting labs drawn this week.  Will stop by office after blood draw to have appointment scheduled.

## 2014-05-26 NOTE — Telephone Encounter (Signed)
Called and left message for patient to return call.  

## 2014-05-27 ENCOUNTER — Telehealth: Payer: Self-pay | Admitting: *Deleted

## 2014-05-27 ENCOUNTER — Institutional Professional Consult (permissible substitution): Payer: Medicaid Other | Admitting: Pulmonary Disease

## 2014-05-27 NOTE — Telephone Encounter (Signed)
Pt has been scheduled x 8 appts with our office for a new consult and has not come for the appointments. Per [facility] call the referring provider's office and advise them of this and that we cannot reschedule the pt. I have LMTCB at Dr. Moshe Cipro office to advise.  Bing, CMA

## 2014-05-27 NOTE — Telephone Encounter (Signed)
Spoke with Mark Walls at [facility] and pt has had 8 appointments and has No showed all of them. Dr. Gwenette Greet will not reschedule this pt.

## 2014-05-27 NOTE — Telephone Encounter (Signed)
Noted  

## 2014-05-28 LAB — HEMOGLOBIN A1C
Hgb A1c MFr Bld: 7.2 % — ABNORMAL HIGH (ref <5.7)
Mean Plasma Glucose: 160 mg/dL — ABNORMAL HIGH (ref <117)

## 2014-05-29 LAB — LIPID PANEL
CHOL/HDL RATIO: 5.4 ratio
Cholesterol: 233 mg/dL — ABNORMAL HIGH (ref 0–200)
HDL: 43 mg/dL (ref >39)
LDL Cholesterol: 156 mg/dL — ABNORMAL HIGH (ref 0–99)
Triglycerides: 169 mg/dL — ABNORMAL HIGH (ref <150)
VLDL: 34 mg/dL (ref 0–40)

## 2014-05-29 LAB — COMPLETE METABOLIC PANEL WITH GFR
ALK PHOS: 71 U/L (ref 39–117)
ALT: 134 U/L — ABNORMAL HIGH (ref 0–53)
AST: 61 U/L — ABNORMAL HIGH (ref 0–37)
Albumin: 4.2 g/dL (ref 3.5–5.2)
BUN: 15 mg/dL (ref 6–23)
CO2: 27 mEq/L (ref 19–32)
Calcium: 10.1 mg/dL (ref 8.4–10.5)
Chloride: 101 mEq/L (ref 96–112)
Creat: 0.88 mg/dL (ref 0.50–1.35)
GFR, Est African American: >89 mL/min
GLUCOSE: 185 mg/dL — AB (ref 70–99)
POTASSIUM: 5.4 meq/L — AB (ref 3.5–5.3)
Sodium: 136 mEq/L (ref 135–145)
Total Bilirubin: 0.6 mg/dL (ref 0.2–1.2)
Total Protein: 7.4 g/dL (ref 6.0–8.3)

## 2014-06-09 ENCOUNTER — Encounter: Payer: Medicaid Other | Admitting: Cardiology

## 2014-06-09 NOTE — Progress Notes (Signed)
Encounter opened in error

## 2014-06-10 ENCOUNTER — Encounter: Payer: Self-pay | Admitting: Pulmonary Disease

## 2014-06-10 NOTE — Telephone Encounter (Signed)
Office advised. We have sent a letter advising the pt that he needs to call us if he wants to reschedule because his # are not working.[place] Bing, CMA

## 2014-06-24 ENCOUNTER — Institutional Professional Consult (permissible substitution): Payer: Medicaid Other | Admitting: Pulmonary Disease

## 2014-07-06 ENCOUNTER — Telehealth: Payer: Self-pay | Admitting: Family Medicine

## 2014-07-06 DIAGNOSIS — L918 Other hypertrophic disorders of the skin: Secondary | ICD-10-CM

## 2014-07-07 NOTE — Telephone Encounter (Signed)
Referral entered for Dr. Josue Hector at [facility]

## 2014-07-07 NOTE — Telephone Encounter (Signed)
Noted thanks °

## 2014-07-08 ENCOUNTER — Ambulatory Visit: Payer: Medicaid Other | Admitting: Family Medicine

## 2014-07-14 ENCOUNTER — Other Ambulatory Visit: Payer: Self-pay | Admitting: Family Medicine

## 2014-07-28 ENCOUNTER — Encounter: Payer: Self-pay | Admitting: Family Medicine

## 2014-07-28 ENCOUNTER — Ambulatory Visit (INDEPENDENT_AMBULATORY_CARE_PROVIDER_SITE_OTHER): Payer: Medicaid Other | Admitting: Family Medicine

## 2014-07-28 ENCOUNTER — Encounter (INDEPENDENT_AMBULATORY_CARE_PROVIDER_SITE_OTHER): Payer: Self-pay

## 2014-07-28 VITALS — BP 156/70 | HR 79 | Resp 16 | Ht 67.0 in | Wt 213.0 lb

## 2014-07-28 DIAGNOSIS — M25569 Pain in unspecified knee: Secondary | ICD-10-CM

## 2014-07-28 DIAGNOSIS — I10 Essential (primary) hypertension: Secondary | ICD-10-CM

## 2014-07-28 DIAGNOSIS — E1165 Type 2 diabetes mellitus with hyperglycemia: Secondary | ICD-10-CM

## 2014-07-28 DIAGNOSIS — IMO0001 Reserved for inherently not codable concepts without codable children: Secondary | ICD-10-CM

## 2014-07-28 DIAGNOSIS — IMO0002 Reserved for concepts with insufficient information to code with codable children: Secondary | ICD-10-CM

## 2014-07-28 DIAGNOSIS — M25562 Pain in left knee: Secondary | ICD-10-CM

## 2014-07-28 DIAGNOSIS — E785 Hyperlipidemia, unspecified: Secondary | ICD-10-CM

## 2014-07-28 MED ORDER — BENAZEPRIL HCL 40 MG PO TABS: 40.0000 mg | ORAL_TABLET | Freq: Every day | ORAL | Status: DC

## 2014-07-28 MED ORDER — PRAVASTATIN SODIUM 10 MG PO TABS: ORAL_TABLET | ORAL | Status: DC

## 2014-07-28 MED ORDER — HYDROCHLOROTHIAZIDE 25 MG PO TABS: 25.0000 mg | ORAL_TABLET | Freq: Every day | ORAL | Status: DC

## 2014-07-28 MED ORDER — GLIPIZIDE ER 10 MG PO TB24: ORAL_TABLET | ORAL | Status: DC

## 2014-07-28 NOTE — Patient Instructions (Addendum)
F/u early October, pls get fasting labs 3 to 5 days before appt.  You are referred to orthopedics in [place] re left knee pain and instability  You need to add pravachol 10 mg one tablet Monday, Wednesday, Friday ALONG with zetia every day for cholesterol  Increaser glipizide to 9m XL one daily, OK to take two 581mtabs together daily till done  BP medication is increased , change is to benazepril 40105mNE daily,a nd HCTZ 25 mg oNE daily (OK to take two of the ones you have every day till done   Fasting lipid, cmp and EGFr and hBA1C for next appt

## 2014-08-03 NOTE — Assessment & Plan Note (Signed)
Uncontrolled Increase med dose DASH diet and commitment to daily physical activity for a minimum of 30 minutes discussed and encouraged, as a part of hypertension management. The importance of attaining a healthy weight is also discussed.

## 2014-08-03 NOTE — Assessment & Plan Note (Signed)
Increased pain and instability, ortho to eval and treat

## 2014-08-03 NOTE — Assessment & Plan Note (Signed)
Uncontrolled .lipo1 Dose increase in meds

## 2014-08-03 NOTE — Assessment & Plan Note (Signed)
Uncontrolled Increase dose of glucotrol Patient advised to reduce carb and sweets, commit to regular physical activity, take meds as prescribed, test blood as directed, and attempt to lose weight, to improve blood sugar control.

## 2014-08-03 NOTE — Progress Notes (Signed)
Subjective:    Patient ID: Mark Walls, male    DOB: [birth year], 58 y.o.   MRN: [id number]  HPI The PT is here for follow up and re-evaluation of chronic medical conditions, medication management and review of any available recent lab and radiology data.  Preventive health is updated, specifically  Cancer screening and Immunization.   Questions or concerns regarding consultations or procedures which the PT has had in the interim are  Addressed.Still needs sleep assessment, had car probs, has had cardiology  eval The PT denies any adverse reactions to current medications since the last visit.   c/o worsenifn left knee pain and instability wants ortho eval, states that his current ortho Doc is refusing to operate and suggests eval by a colleague of his in [place] blood sugars are remaining around 150 to 200 fasting which he is aware is higher than they should be      Review of Systems See HPI Denies recent fever or chills. Denies sinus pressure, nasal congestion, ear pain or sore throat. Denies chest congestion, productive cough or wheezing. Denies chest pains, palpitations and leg swelling Denies abdominal pain, nausea, vomiting,diarrhea or constipation.   Denies dysuria, frequency, hesitancy or incontinence.  Denies headaches, seizures, numbness, or tingling. Denies depression, anxiety or insomnia. Denies skin break down or rash.        Objective:   Physical Exam BP 156/70  Pulse 79  Resp 16  Ht 5\' 7"  (1.702 m)  Wt 213 lb (96.616 kg)  BMI 33.35 kg/m2  SpO2 95% Patient alert and oriented and in no cardiopulmonary distress.  HEENT: No facial asymmetry, EOMI,   oropharynx pink and moist.  Neck supple no JVD, no mass.  Chest: Clear to auscultation bilaterally.  CVS: S1, S2 no murmurs, no S3.Regular rate.  ABD: Soft non tender.   Ext: No edema  MS: Adequate though reduced  ROM spine, shoulders, hips and reduced excessively in left   knees.  Skin:  Intact, no ulcerations or rash noted.  Psych: Good eye contact, normal affect. Memory intact not anxious or depressed appearing.  CNS: CN 2-12 intact, power,  normal throughout.no focal deficits noted.        Assessment & Plan:  Knee pain, left Increased pain and instability, ortho to eval and treat  Type 2 diabetes mellitus, uncontrolled Uncontrolled Increase dose of glucotrol Patient advised to reduce carb and sweets, commit to regular physical activity, take meds as prescribed, test blood as directed, and attempt to lose weight, to improve blood sugar control.   HYPERLIPIDEMIA Uncontrolled .lipo1 Dose increase in meds  HYPERTENSION Uncontrolled Increase med dose DASH diet and commitment to daily physical activity for a minimum of 30 minutes discussed and encouraged, as a part of hypertension management. The importance of attaining a healthy weight is also discussed.

## 2014-08-04 ENCOUNTER — Ambulatory Visit (INDEPENDENT_AMBULATORY_CARE_PROVIDER_SITE_OTHER)
Admission: RE | Admit: 2014-08-04 | Discharge: 2014-08-04 | Disposition: A | Discharge status: Home / Self Care | Payer: Medicaid Other | Source: Ambulatory Visit | Attending: Pulmonary Disease | Admitting: Pulmonary Disease

## 2014-08-04 ENCOUNTER — Ambulatory Visit (INDEPENDENT_AMBULATORY_CARE_PROVIDER_SITE_OTHER): Payer: Medicaid Other | Admitting: Pulmonary Disease

## 2014-08-04 ENCOUNTER — Encounter: Payer: Self-pay | Admitting: Pulmonary Disease

## 2014-08-04 VITALS — BP 132/64 | HR 63 | Temp 98.0°F | Ht 67.0 in | Wt 215.0 lb

## 2014-08-04 DIAGNOSIS — R0683 Snoring: Secondary | ICD-10-CM

## 2014-08-04 DIAGNOSIS — R0989 Other specified symptoms and signs involving the circulatory and respiratory systems: Secondary | ICD-10-CM

## 2014-08-04 DIAGNOSIS — J449 Chronic obstructive pulmonary disease, unspecified: Secondary | ICD-10-CM

## 2014-08-04 DIAGNOSIS — R0609 Other forms of dyspnea: Secondary | ICD-10-CM

## 2014-08-04 NOTE — Assessment & Plan Note (Addendum)
We discussed high possibility of obstructive sleep apnea as cause of your sleepiness Over night sleep study - If we do not see obstructive sleep apnea , we may do a nap test the next morning No adderall x 2 ds prior to test  Given excessive daytime somnolence, narrow pharyngeal exam, witnessed apneas & loud snoring, obstructive sleep apnea is very likely & an overnight polysomnogram will be scheduled as a split study. The pathophysiology of obstructive sleep apnea , it's cardiovascular consequences & modes of treatment including CPAP were discused with the patient in detail & they evidenced understanding.

## 2014-08-04 NOTE — Progress Notes (Signed)
Subjective:    Patient ID: Mark Walls, male    DOB: Sep 27, 1956, 58 y.o.   MRN: [id number]  HPI 58 year old ex-smoker presents for evaluation of  COPD and excessive daytime somnolence for 3 years.  He smoked up to maximum of 3 packs per day starting as a teenager until 2014-about 60 pack years. He is peripheral arterial disease with stents. He is disabled for 10 years, and used to work in [employer].   He reports psychiatric issues and is maintained on Xanax for anxiety, for increased somnolence he takes Adderall at 10 AM-1 PM and again at 5 PM. In spite of this he can sleep sometimes for 2-3 days at a stretch. He has been noted to nap whenever he can. He had a polysomnogram at any [facility] 10 years ago and was told he does not have OSA.   Bedtime is around 11 PM,sleep latency is minimal, he sleeps on his side with one pillow, reports 2-3 nocturnal awakenings for bathroom visits and is out of bed by 11 AM feeling tired with dryness of mouth and frequent headaches which last throughout the day.loud snoring has been noted by family members but no witnessed apneas. There is no history suggestive of cataplexy, sleep paralysis or parasomnias He denies excessive use of caffeinated beverages. Spirometry showed FEV1 1.65 -48% with ratio 67 Chest x-ray 11/2011 did not show infiltrates or effusions.  Past Medical History  Diagnosis Date  . PVD (peripheral vascular disease)   . ED (erectile dysfunction)   . Anxiety   . Depression   . Obesity   . Chronic fatigue   . Nicotine addiction   . Diabetes mellitus, type 2   . Hyperlipidemia   . Hypertension    Past Surgical History  Procedure Laterality Date  . Cholecystectomy    . Polpectomy colon    . Esophagogastroduodenoscopy  09/24/2002    [name]:gastritis of the antrum and the bodt of the stomach otherwise normal  . Left knee athroscopy  03/2008  . Rotator cuff repair  09/01/08  . Right elbow    . Left hand    . Colonoscopy  10/01/2002   [name]:small internal hemorrhoids/single small sessile polyp in the rectum/ otherwise normal. Hyperplastic polyp.  . Right common iliac artery stenting    . Colonoscopy with esophagogastroduodenoscopy (egd) N/A 08/19/2013    WUJ:WJXBJYNW EG junction. Hiatal hernia. Abnormal gastric mucosa s/p bx: Internal hemorrhoids;  Otherwise, normal ileocolonoscopy - Status post segmental biopsy   Allergies  Allergen Reactions  . Allopurinol Nausea And Vomiting  . Levofloxacin Nausea And Vomiting  . Statins Other (See Comments)    Fatty liver, markedly elevated LFT  . Sulfamethoxazole-Trimethoprim Nausea And Vomiting  . Codeine Rash   History   Social History  . Marital Status: Married    Spouse Name: N/A    Number of Children: 2  . Years of Education: N/A   Occupational History  . disabled     Social History Main Topics  . Smoking status: Former Smoker -- 1.00 packs/day for 40 years    Types: Cigarettes    Quit date: 11/17/2013  . Smokeless tobacco: Not on file  . Alcohol Use: No  . Drug Use: No  . Sexual Activity: Not on file   Other Topics Concern  . Not on file   Social History Narrative  . No narrative on file    Family History  Problem Relation Age of Onset  . Cirrhosis Mother   . Melanoma Mother   .  Mental illness Brother   . Mental illness Father     suicide   . Melanoma Brother   . Mental illness Brother   . Diabetes Brother   . Hypertension Brother   . Diabetes Brother   . Hypertension Brother   . Colon cancer Paternal Grandfather     age 75     Review of Systems  Constitutional: Negative for fever and unexpected weight change.  HENT: Negative for congestion, dental problem, ear pain, nosebleeds, postnasal drip, rhinorrhea, sinus pressure, sneezing, sore throat and trouble swallowing.   Eyes: Negative for redness and itching.  Respiratory: Positive for shortness of breath. Negative for cough, chest tightness and wheezing.   Cardiovascular: Negative for  palpitations and leg swelling.  Gastrointestinal: Negative for nausea and vomiting.  Genitourinary: Negative for dysuria.  Musculoskeletal: Positive for arthralgias. Negative for joint swelling.  Skin: Negative for rash.  Neurological: Positive for headaches.  Hematological: Does not bruise/bleed easily.  Psychiatric/Behavioral: Positive for dysphoric mood. The patient is nervous/anxious.        Objective:   Physical Exam  Gen. Pleasant, obese, in no distress, normal affect ENT - no lesions, no post nasal drip, class 2-3 airway Neck: No JVD, no thyromegaly, no carotid bruits Lungs: no use of accessory muscles, no dullness to percussion, decreased without rales or rhonchi  Cardiovascular: Rhythm regular, heart sounds  normal, no murmurs or gallops, no peripheral edema Abdomen: soft and non-tender, no hepatosplenomegaly, BS normal. Musculoskeletal: No deformities, no cyanosis or clubbing Neuro:  alert, non focal, no tremors       Assessment & Plan:

## 2014-08-04 NOTE — Patient Instructions (Addendum)
We discussed possibility of obstructive sleep apnea as cause of your sleepiness Over night sleep study - If we do not see obstructive sleep apnea , we may do a nap test the next morning Stop adderall 2 days prior to study You do have moderate COPD from your smoking - lung capacity is at 50% Stay on symbicort & spiriva CXR today

## 2014-08-04 NOTE — Assessment & Plan Note (Signed)
You do have moderate COPD from your smoking - lung capacity is at 50% Stay on symbicort & spiriva CXR today Consider Lung cancer screening in future

## 2014-08-05 ENCOUNTER — Telehealth: Payer: Self-pay | Admitting: *Deleted

## 2014-08-05 NOTE — Telephone Encounter (Signed)
PER Anderson Malta C she has already spoken with pt about results.

## 2014-08-05 NOTE — Telephone Encounter (Signed)
Notes Recorded by Rigoberto Noel, MD on 08/04/2014 at 4:00 PM Clear cxr Changes of COPD    ATC PT NA [facility]

## 2014-08-30 ENCOUNTER — Ambulatory Visit (HOSPITAL_BASED_OUTPATIENT_CLINIC_OR_DEPARTMENT_OTHER): Payer: Medicaid Other | Attending: Pulmonary Disease | Admitting: Radiology

## 2014-08-30 VITALS — Ht 67.0 in | Wt 215.0 lb

## 2014-08-30 DIAGNOSIS — R0683 Snoring: Secondary | ICD-10-CM

## 2014-08-30 DIAGNOSIS — G4733 Obstructive sleep apnea (adult) (pediatric): Secondary | ICD-10-CM | POA: Insufficient documentation

## 2014-08-31 ENCOUNTER — Encounter (HOSPITAL_BASED_OUTPATIENT_CLINIC_OR_DEPARTMENT_OTHER): Payer: Medicaid Other

## 2014-09-08 ENCOUNTER — Telehealth: Payer: Self-pay | Admitting: Pulmonary Disease

## 2014-09-08 DIAGNOSIS — G4710 Hypersomnia, unspecified: Secondary | ICD-10-CM

## 2014-09-08 DIAGNOSIS — G4733 Obstructive sleep apnea (adult) (pediatric): Secondary | ICD-10-CM

## 2014-09-08 DIAGNOSIS — G4730 Sleep apnea, unspecified: Secondary | ICD-10-CM

## 2014-09-08 NOTE — Telephone Encounter (Signed)
PSG showed severe OSA, stop breathing 34 per hour. Suggest CPAP titration study

## 2014-09-08 NOTE — Sleep Study (Signed)
[place]   NAME: [name]  DATE OF BIRTH: Feb 13, 1956  MEDICAL RECORD [id number]  LOCATION: [facility]   PHYSICIAN: Terran Klinke V.   DATE OF STUDY: 08/30/14   SLEEP STUDY TYPE: Nocturnal Polysomnogram   REFERRING PHYSICIAN: Rigoberto Noel, MD   INDICATION FOR STUDY:  58 year old with moderate COPD, excessive daytime somnolence and loud snoring. At the time of this study ,they weighed 215 pounds with a height of 5 ft 7 inches and the BMI of 34, neck size of 18 inches. Epworth sleepiness score was 4   This nocturnal polysomnogram was performed with a sleep technologist in attendance. EEG, EOG,EMG and respiratory parameters recorded. Sleep stages, arousals, limb movements and respiratory data was scored according to criteria laid out by the American Academy of sleep medicine.   SLEEP ARCHITECTURE: Lights out was at 2256 PM and lights on was at 559 AM. Total sleep time was 410 minutes with a sleep period time of 419 minutes and a sleep efficiency of 97 %. Sleep latency was 4 minutes with latency to REM sleep of 74 minutes and wake after sleep onset of 9 minutes. . Sleep stages as a percentage of total sleep time was N1 -2.3 %,N2- 80 % and REM sleep 17.8 % ( 73 minutes) . The longest period of REM sleep was around 12:30 AM.   AROUSAL DATA : There were 439  arousals with an arousal index of 64 events per hour. Most of these were spontaneous & 10 were associated with respiratory events  RESPIRATORY DATA: There were 40 to obstructive apneas, 9 central apneas, 3 mixed apneas and 20 to hypopneas with apnea -hypopnea index of 37 events per hour. There were 189 RERAs with an RDI of 65 events per hour. There was no relation to sleep stage or body position. Supine sleep was noted  MOVEMENT/PARASOMNIA: There were 0 PLMS with a PLM index of 0 events per hour. The PLM arousal index was 0 per hour.  OXYGEN DATA: The lowest desaturation was 85 % during  REM sleep and the desaturation index was 34 per hour.   CARDIAC DATA: The low heart rate was 45 beats per minute. The high heart rate recorded was an artifact. No arrhythmias were noted   DISCUSSION -Loud snoring was noted . Events were independent of sleep stage or body position  IMPRESSION :  1. severe obstructive sleep apnea with hypopneas causing sleep fragmentation and mild oxygen desaturation.  2. No evidence of cardiac arrhythmias,periodic limb movements or behavioral disturbance during sleep.  3. Sleep efficiency was good  RECOMMENDATION:  1. Treatment options for this degree of sleep disordered breathing include weight loss and CPAP therapy. 2. Patient should be cautioned against driving when sleepy  3. They should be asked to avoid medications with sedative side effects    Rigoberto Noel MD Diplomate, American Board of Sleep Medicine    ELECTRONICALLY SIGNED ON: 09/08/2014  [facility]  PH: [phone] FX: ([phone]) [place]

## 2014-09-08 NOTE — Telephone Encounter (Signed)
lmtcb x1 w/ spouse 

## 2014-09-09 NOTE — Telephone Encounter (Signed)
lmtcb x2 

## 2014-09-10 NOTE — Telephone Encounter (Signed)
lmtcb x3 

## 2014-09-11 NOTE — Telephone Encounter (Signed)
I spoke with patient about results and he verbalized understanding and had no questions Order placed. Will forward to PCC;s as an [place]

## 2014-09-21 ENCOUNTER — Ambulatory Visit: Payer: Medicaid Other | Admitting: Family Medicine

## 2014-09-24 ENCOUNTER — Encounter: Payer: Medicaid Other | Admitting: Cardiology

## 2014-09-30 ENCOUNTER — Telehealth: Payer: Self-pay | Admitting: Family Medicine

## 2014-09-30 DIAGNOSIS — I10 Essential (primary) hypertension: Secondary | ICD-10-CM

## 2014-09-30 DIAGNOSIS — E785 Hyperlipidemia, unspecified: Secondary | ICD-10-CM

## 2014-09-30 DIAGNOSIS — IMO0002 Reserved for concepts with insufficient information to code with codable children: Secondary | ICD-10-CM

## 2014-09-30 DIAGNOSIS — E1165 Type 2 diabetes mellitus with hyperglycemia: Secondary | ICD-10-CM

## 2014-09-30 NOTE — Telephone Encounter (Addendum)
Pls contact pt. Explain that he needs labs done asap, this week pLEASE I have been asked to clear him for knee replacement and need updated labs  Pls order CBC, fasting lipid, cmp and EGFr, hBA1C TSH and PSa all are due, EXPLAIN he NEEDS these by Friday so I can respond to orthopedic Doc and I know he wamnts his knee done

## 2014-10-01 NOTE — Telephone Encounter (Signed)
Called patient and left message for them to return call at the office   

## 2014-10-05 ENCOUNTER — Other Ambulatory Visit: Payer: Self-pay | Admitting: Family Medicine

## 2014-10-05 NOTE — Telephone Encounter (Signed)
Spoke with wife and she insured that she will give message to patient and have him to go to lab.  Order faxed to lab

## 2014-10-05 NOTE — Addendum Note (Signed)
Addended by: Denman George B on: 10/05/2014 05:00 PM   Modules accepted: Orders

## 2014-10-07 LAB — LIPID PANEL
CHOLESTEROL: 224 mg/dL — AB (ref 0–200)
HDL: 38 mg/dL — ABNORMAL LOW (ref >39)
LDL Cholesterol: 155 mg/dL — ABNORMAL HIGH (ref 0–99)
Total CHOL/HDL Ratio: 5.9 Ratio
Triglycerides: 157 mg/dL — ABNORMAL HIGH (ref <150)
VLDL: 31 mg/dL (ref 0–40)

## 2014-10-07 LAB — COMPLETE METABOLIC PANEL WITH GFR
ALK PHOS: 66 U/L (ref 39–117)
ALT: 151 U/L — ABNORMAL HIGH (ref 0–53)
AST: 67 U/L — ABNORMAL HIGH (ref 0–37)
Albumin: 4.2 g/dL (ref 3.5–5.2)
BILIRUBIN TOTAL: 0.6 mg/dL (ref 0.2–1.2)
BUN: 15 mg/dL (ref 6–23)
CO2: 28 meq/L (ref 19–32)
Calcium: 10.1 mg/dL (ref 8.4–10.5)
Chloride: 105 mEq/L (ref 96–112)
Creat: 0.9 mg/dL (ref 0.50–1.35)
GFR, Est African American: >89 mL/min
GLUCOSE: 142 mg/dL — AB (ref 70–99)
Potassium: 5.1 mEq/L (ref 3.5–5.3)
Sodium: 140 mEq/L (ref 135–145)
Total Protein: 7.1 g/dL (ref 6.0–8.3)

## 2014-10-07 LAB — CBC
HEMATOCRIT: 43.5 % (ref 39.0–52.0)
HEMOGLOBIN: 14.9 g/dL (ref 13.0–17.0)
MCH: 31.1 pg (ref 26.0–34.0)
MCHC: 34.3 g/dL (ref 30.0–36.0)
MCV: 90.8 fL (ref 78.0–100.0)
PLATELETS: 229 10*3/uL (ref 150–400)
RBC: 4.79 MIL/uL (ref 4.22–5.81)
RDW: 13.5 % (ref 11.5–15.5)
WBC: 7.1 10*3/uL (ref 4.0–10.5)

## 2014-10-07 LAB — TSH: TSH: 4.632 u[IU]/mL — AB (ref 0.350–4.500)

## 2014-10-07 LAB — HEMOGLOBIN A1C
Hgb A1c MFr Bld: 8.1 % — ABNORMAL HIGH (ref <5.7)
MEAN PLASMA GLUCOSE: 186 mg/dL — AB (ref <117)

## 2014-10-08 LAB — T3, FREE: T3, Free: 3.6 pg/mL (ref 2.3–4.2)

## 2014-10-08 LAB — T4, FREE: FREE T4: 1.29 ng/dL (ref 0.80–1.80)

## 2014-10-15 ENCOUNTER — Other Ambulatory Visit: Payer: Self-pay | Admitting: Family Medicine

## 2014-10-26 ENCOUNTER — Telehealth: Payer: Self-pay | Admitting: Family Medicine

## 2014-10-27 ENCOUNTER — Telehealth: Payer: Self-pay | Admitting: *Deleted

## 2014-10-27 NOTE — Telephone Encounter (Signed)
Pt's wife called stating the patient needs to speak with a nurse that it is important. Patient's wife stated they didn't hear from the nurse yesterday. Please advise

## 2014-10-27 NOTE — Telephone Encounter (Signed)
Called patient no answer.

## 2014-10-27 NOTE — Telephone Encounter (Signed)
Mark Walls called back and spoke with patient

## 2014-10-27 NOTE — Telephone Encounter (Signed)
Patient stating that he is need of early fill of testing strips.  Wife will call back in patient is unable to fill strips today.

## 2014-10-28 ENCOUNTER — Encounter: Payer: Self-pay | Admitting: Family Medicine

## 2014-10-28 ENCOUNTER — Ambulatory Visit (INDEPENDENT_AMBULATORY_CARE_PROVIDER_SITE_OTHER): Payer: Medicaid Other | Admitting: Family Medicine

## 2014-10-28 ENCOUNTER — Ambulatory Visit (INDEPENDENT_AMBULATORY_CARE_PROVIDER_SITE_OTHER): Payer: Medicaid Other

## 2014-10-28 VITALS — BP 148/74 | HR 74 | Resp 16 | Ht 67.0 in | Wt 214.0 lb

## 2014-10-28 DIAGNOSIS — Z9119 Patient's noncompliance with other medical treatment and regimen: Secondary | ICD-10-CM

## 2014-10-28 DIAGNOSIS — H547 Unspecified visual loss: Secondary | ICD-10-CM

## 2014-10-28 DIAGNOSIS — I10 Essential (primary) hypertension: Secondary | ICD-10-CM

## 2014-10-28 DIAGNOSIS — E1165 Type 2 diabetes mellitus with hyperglycemia: Secondary | ICD-10-CM

## 2014-10-28 DIAGNOSIS — Z91199 Patient's noncompliance with other medical treatment and regimen due to unspecified reason: Secondary | ICD-10-CM

## 2014-10-28 DIAGNOSIS — E785 Hyperlipidemia, unspecified: Secondary | ICD-10-CM

## 2014-10-28 DIAGNOSIS — IMO0002 Reserved for concepts with insufficient information to code with codable children: Secondary | ICD-10-CM

## 2014-10-28 DIAGNOSIS — Z23 Encounter for immunization: Secondary | ICD-10-CM

## 2014-10-28 DIAGNOSIS — H53131 Sudden visual loss, right eye: Secondary | ICD-10-CM

## 2014-10-28 DIAGNOSIS — Z125 Encounter for screening for malignant neoplasm of prostate: Secondary | ICD-10-CM

## 2014-10-28 DIAGNOSIS — F411 Generalized anxiety disorder: Secondary | ICD-10-CM

## 2014-10-28 DIAGNOSIS — Z01818 Encounter for other preprocedural examination: Secondary | ICD-10-CM

## 2014-10-28 LAB — POCT URINALYSIS DIPSTICK
Bilirubin, UA: NEGATIVE
Blood, UA: NEGATIVE
GLUCOSE UA: 100
KETONES UA: NEGATIVE
Leukocytes, UA: NEGATIVE
Nitrite, UA: NEGATIVE
Protein, UA: NEGATIVE
SPEC GRAV UA: 1.025
UROBILINOGEN UA: 0.2
pH, UA: 5

## 2014-10-28 NOTE — Telephone Encounter (Signed)
Patient was seen today 11.11.2015 by Dr Moshe Cipro

## 2014-10-28 NOTE — Progress Notes (Signed)
Subjective:    Patient ID: Mark Walls, male    DOB: [birth year], 58 y.o.   MRN: [id number]  HPI The PT is here for follow up and re-evaluation of chronic medical conditions, medication management and review of any available recent lab and radiology data.  Preventive health is updated, specifically  Cancer screening and Immunization.   Questions or concerns regarding consultations or procedures which the PT has had in the interim are  Addressed.currently needs to find a new psych as medicaid will no longer pay for his care in [place]. Awaiting clearance for knee replacement  Requests help with vision , states it has significantly worsened "can hardly see" The PT has not brought in medication for review which is absolutely necessary for this visit , his chronic conditrions are uncontrolled and he needs medical clearance      Review of Systems See HPI Denies recent fever or chills. Denies sinus pressure, nasal congestion, ear pain or sore throat. Denies chest congestion, productive cough or wheezing. Denies chest pains, palpitations and leg swelling Denies abdominal pain, nausea, vomiting,diarrhea or constipation.   Denies dysuria, frequency, hesitancy or incontineDenies joint pain, swelling and limitation in mobility. Denies headaches, seizures, numbness, or tingling.        Objective:   Physical Exam BP 148/74 mmHg  Pulse 74  Resp 16  Ht 5\' 7"  (1.702 m)  Wt 214 lb (97.07 kg)  BMI 33.51 kg/m2  SpO2 94% Patient alert and oriented and in no cardiopulmonary distress. Marked loss of vision in right eye 20/200 on screening test  HEENT: No facial asymmetry, EOMI,   oropharynx pink and moist.  Neck supple no JVD, no mass.  Chest: Clear to auscultation bilaterally.  CVS: S1, S2 no murmurs, no S3.Regular rate.  ABD: Soft non tender.   Ext: No edema  MS:  reduced  ROM spine, shoulders, hips and knees.  Skin: Intact, calluses on feet bilaterally Psych: Good eye  contact, normal affect. Memory intact not anxious or depressed appearing.  CNS: CN 2-12 intact, power,  normal throughout.no focal deficits noted.        Assessment & Plan:  Type 2 diabetes mellitus, uncontrolled Deteriorated and uncontrolled Patient advised to reduce carb and sweets, commit to regular physical activity, take meds as prescribed, test blood as directed, and attempt to lose weight, to improve blood sugar control. Pt does not have his medication and I am therefore really able to asses compliance , he needs to bring his meds for visual review, this is chronic and ongoing problem Unfortunately, he needs clearance for knee surgery and tis is also held up  Loss of vision Reports progressive loss of vision in right eye, needs eye exam Acuity test in office very abnormal referral entered  GAD (generalized anxiety disorder) Was being treated by psychiatrist in [place], needs to seek local caregiver, I have recommended record transfer to local mental health dept  Hyperlipidemia Unchanged and uncontrolled C/o stating intolerance, takes pravachol 2 to 3 times per week, should also have been on zetia daily, unclear as to what he is actually doing, needs to bring in meds for visual review Hyperlipidemia:Low fat diet discussed and encouraged.    Essential hypertension Uncontrolled , needs dose increase in meds, need to ascertain what he is taking. DASH diet and commitment to daily physical activity for a minimum of 30 minutes discussed and encouraged, as a part of hypertension management. The importance of attaining a healthy weight is also discussed.  H/O noncompliance with medical treatment, presenting hazards to health Misses appts like multiple cardiology f/u , now needs cardiology clearance prior to knee surgery Never brings medications to visit, chronic conditions remain uncontrolled as a result since I a unable to modify medications appropriately I again explained  the importance of accepting some responsibility for his health to improve it  Need for prophylactic vaccination and inoculation against influenza Vaccine administered at visit.   Preoperative clearance No current infection, and urinalysis normal Chronic medical conditions are not optimally controlled, due in part to pt non compliance Overall no medical problem at this time is sufficient to have me recommend against  Knee surgery, he is to have cardiology evaluation in the near future also prior to proposed date of surgery He needs to bring in medication for physical review in the next week, so necvessary med adjustments can be made to improve his health

## 2014-10-28 NOTE — Patient Instructions (Addendum)
F/u Jan 22 or after, call if you need me before  Keep appointment with cardiology  Seek mental health care in [place]  Flu vaccine, foot exam and microalb today  Vision screen today is abnormal ,you will be referred to Dr Gershon Crane if he takes your ins  BP is high, you need benazepril 40 mg one daily, if you do not have this dose at home call back and let nurse know, so it can be sent in AGAIN  HBA1c , fasting lipid, cmp and EGFr and PSA Jan 19 or after  I will  contact you after fully reviewing your record as far as circulation in the leg is concerned

## 2014-10-29 LAB — MICROALBUMIN / CREATININE URINE RATIO
Creatinine, Urine: 175 mg/dL
Microalb Creat Ratio: 3.4 mg/g (ref 0.0–30.0)
Microalb, Ur: 0.6 mg/dL (ref <2.0)

## 2014-11-01 ENCOUNTER — Telehealth: Payer: Self-pay | Admitting: Family Medicine

## 2014-11-01 DIAGNOSIS — Z23 Encounter for immunization: Secondary | ICD-10-CM | POA: Insufficient documentation

## 2014-11-01 DIAGNOSIS — Z01818 Encounter for other preprocedural examination: Secondary | ICD-10-CM | POA: Insufficient documentation

## 2014-11-01 NOTE — Assessment & Plan Note (Signed)
Uncontrolled , needs dose increase in meds, need to ascertain what he is taking. DASH diet and commitment to daily physical activity for a minimum of 30 minutes discussed and encouraged, as a part of hypertension management. The importance of attaining a healthy weight is also discussed.

## 2014-11-01 NOTE — Assessment & Plan Note (Addendum)
Reports progressive loss of vision in right eye, needs eye exam Acuity test in office very abnormal referral entered

## 2014-11-01 NOTE — Assessment & Plan Note (Signed)
No current infection, and urinalysis normal Chronic medical conditions are not optimally controlled, due in part to pt non compliance Overall no medical problem at this time is sufficient to have me recommend against  Knee surgery, he is to have cardiology evaluation in the near future also prior to proposed date of surgery He needs to bring in medication for physical review in the next week, so necvessary med adjustments can be made to improve his health

## 2014-11-01 NOTE — Assessment & Plan Note (Signed)
Deteriorated and uncontrolled Patient advised to reduce carb and sweets, commit to regular physical activity, take meds as prescribed, test blood as directed, and attempt to lose weight, to improve blood sugar control. Pt does not have his medication and I am therefore really able to asses compliance , he needs to bring his meds for visual review, this is chronic and ongoing problem Unfortunately, he needs clearance for knee surgery and tis is also held up

## 2014-11-01 NOTE — Telephone Encounter (Signed)
pls contact pt. He needs to bring ALL  meds he is actually taking for you to review and enter in his medical record  I am UNABLE to provide medical clearance for surgery without this.PLS explain His BP, blood sugar and lipids are ALL uncontrolled  And  med adjustments are needed for ALL 3 pls arrange a time with him so you can have a nurse visit for this to include  BP check, and let me make ypou aware of dose changes with pt present  Also pls before he leaves the office Let him know that he will be referred for check on lower extremity circulation, but needs to get through knee replacement and eye exam, too many things at one time, some will not get done Also NEEDS to keep appt with cardiology for clearance also, he has No showed for MULTIPLE cardiology visits this year.NOT GOOD!)  ???pls ask

## 2014-11-01 NOTE — Assessment & Plan Note (Signed)
Misses appts like multiple cardiology f/u , now needs cardiology clearance prior to knee surgery Never brings medications to visit, chronic conditions remain uncontrolled as a result since I a unable to modify medications appropriately I again explained the importance of accepting some responsibility for his health to improve it

## 2014-11-01 NOTE — Assessment & Plan Note (Signed)
Unchanged and uncontrolled C/o stating intolerance, takes pravachol 2 to 3 times per week, should also have been on zetia daily, unclear as to what he is actually doing, needs to bring in meds for visual review Hyperlipidemia:Low fat diet discussed and encouraged.

## 2014-11-01 NOTE — Assessment & Plan Note (Signed)
Was being treated by psychiatrist in [place], needs to seek local caregiver, I have recommended record transfer to local mental health dept

## 2014-11-01 NOTE — Assessment & Plan Note (Signed)
Vaccine administered at visit.  

## 2014-11-02 NOTE — Telephone Encounter (Signed)
Called patient and left message for them to return call at the office  cell number in chart was disconnected

## 2014-11-04 ENCOUNTER — Ambulatory Visit (INDEPENDENT_AMBULATORY_CARE_PROVIDER_SITE_OTHER): Payer: Medicaid Other | Admitting: Cardiology

## 2014-11-04 ENCOUNTER — Encounter: Payer: Self-pay | Admitting: Cardiology

## 2014-11-04 VITALS — BP 145/67 | HR 66 | Ht 67.0 in | Wt 216.0 lb

## 2014-11-04 DIAGNOSIS — Z01810 Encounter for preprocedural cardiovascular examination: Secondary | ICD-10-CM

## 2014-11-04 DIAGNOSIS — I739 Peripheral vascular disease, unspecified: Secondary | ICD-10-CM

## 2014-11-04 DIAGNOSIS — E785 Hyperlipidemia, unspecified: Secondary | ICD-10-CM

## 2014-11-04 DIAGNOSIS — R0789 Other chest pain: Secondary | ICD-10-CM

## 2014-11-04 DIAGNOSIS — I10 Essential (primary) hypertension: Secondary | ICD-10-CM

## 2014-11-04 DIAGNOSIS — G4733 Obstructive sleep apnea (adult) (pediatric): Secondary | ICD-10-CM

## 2014-11-04 MED ORDER — METOPROLOL TARTRATE 25 MG PO TABS: 12.5000 mg | ORAL_TABLET | Freq: Two times a day (BID) | ORAL | Status: DC

## 2014-11-04 NOTE — Patient Instructions (Signed)
Your physician recommends that you schedule a follow-up appointment in: 4 months. Your physician has recommended you make the following change in your medication:  Start metoprolol tartrate 12.5 mg twice daily. Continue all other medications the same. Please schedule a follow up appointment with the [facility].

## 2014-11-04 NOTE — Progress Notes (Signed)
Clinical Summary Mark Walls is a 58 y.o.male seen today for follow up of the following medical problems.   1. Chest pain  - long history of chest pain described last visit, mixed in characteristics for cardiac chest pain -  01/2014 Lexiscan MPI, moderate sized inferior defect with mild reversibility, LVEF 63%, overall low risk.  - since last visit denies any significant chest pain.    2. Hyperlipidemia  - elevated LFTs, fatty liver per PCP notes. Statins also cause some fatigue, he is only able to tolerate pravastatin 20mg  every other day.  - 09/2014 TC 224 TG 157 HDL 38 LDL 155.   3. HTN  - does not check at home  - compliant with meds, but has not taken meds yet today.   4. PAD  - prior stent placed 2005, right common iliac  - currently having nonclaudication like leg pain, previous MRI has shown lumbar disease and foraminal impingement  5. COPD  - compliant with inhalers  6. Preoperative evaluation - being considered for knee replacement.   7. OSA - he has had recent sleep study but reports he has not had follow up CPAP titration study   Past Medical History  Diagnosis Date  . PVD (peripheral vascular disease)   . ED (erectile dysfunction)   . Anxiety   . Depression   . Obesity   . Chronic fatigue   . Nicotine addiction   . Diabetes mellitus, type 2   . Hyperlipidemia   . Hypertension      Allergies  Allergen Reactions  . Allopurinol Nausea And Vomiting  . Levofloxacin Nausea And Vomiting  . Statins Other (See Comments)    Fatty liver, markedly elevated LFT  . Sulfamethoxazole-Trimethoprim Nausea And Vomiting  . Codeine Rash     Current Outpatient Prescriptions  Medication Sig Dispense Refill  . ACCU-CHEK AVIVA PLUS test strip TEST BLOOD SUGAR ONCE DAILY. 50 each 2  . albuterol (PROVENTIL HFA;VENTOLIN HFA) 108 (90 BASE) MCG/ACT inhaler Inhale 2 puffs into the lungs every 6 (six) hours as needed for wheezing or shortness of breath. 1  Inhaler 2  . ALPRAZolam (XANAX) 1 MG tablet Take 1 mg by mouth 4 (four) times daily as needed for anxiety.    Marland Kitchen amphetamine-dextroamphetamine (ADDERALL) 10 MG tablet Take 10 mg by mouth 3 (three) times daily.     Marland Kitchen aspirin 81 MG tablet Take 81 mg by mouth daily.    . benazepril (LOTENSIN) 40 MG tablet Take 1 tablet (40 mg total) by mouth daily. 90 tablet 0  . budesonide-formoterol (SYMBICORT) 80-4.5 MCG/ACT inhaler Inhale 2 puffs into the lungs 2 (two) times daily. 1 Inhaler 12  . Canagliflozin 300 MG TABS Take 1 tablet (300 mg total) by mouth daily. 30 tablet 2  . ezetimibe (ZETIA) 10 MG tablet Take 1 tablet (10 mg total) by mouth daily. 30 tablet 5  . glipiZIDE (GLIPIZIDE XL) 10 MG 24 hr tablet TAKE ONE TABLET BY MOUTH ONCE DAILY WITH BREAKFAST 30 tablet 3  . hydrochlorothiazide (HYDRODIURIL) 25 MG tablet Take 1 tablet (25 mg total) by mouth daily. 90 tablet 0  . pravastatin (PRAVACHOL) 10 MG tablet One tablet 3 times a week on Monday, Wednesday, and Friday 12 tablet 0  . SitaGLIPtin-MetFORMIN HCl 50-1000 MG TB24 Take 2 tablets by mouth daily. 60 tablet 5  . vardenafil (LEVITRA) 20 MG tablet Take 20 mg by mouth daily as needed.       No current facility-administered medications  for this visit.     Past Surgical History  Procedure Laterality Date  . Cholecystectomy    . Polpectomy colon    . Esophagogastroduodenoscopy  09/24/2002    [name]:gastritis of the antrum and the bodt of the stomach otherwise normal  . Left knee athroscopy  03/2008  . Rotator cuff repair  09/01/08  . Right elbow    . Left hand    . Colonoscopy  10/01/2002    [name]:small internal hemorrhoids/single small sessile polyp in the rectum/ otherwise normal. Hyperplastic polyp.  . Right common iliac artery stenting    . Colonoscopy with esophagogastroduodenoscopy (egd) N/A 08/19/2013    GHW:EXHBZJIR EG junction. Hiatal hernia. Abnormal gastric mucosa s/p bx: Internal hemorrhoids;  Otherwise, normal ileocolonoscopy - Status  post segmental biopsy     Allergies  Allergen Reactions  . Allopurinol Nausea And Vomiting  . Levofloxacin Nausea And Vomiting  . Statins Other (See Comments)    Fatty liver, markedly elevated LFT  . Sulfamethoxazole-Trimethoprim Nausea And Vomiting  . Codeine Rash      Family History  Problem Relation Age of Onset  . Cirrhosis Mother   . Melanoma Mother   . Mental illness Brother   . Mental illness Father     suicide   . Melanoma Brother   . Mental illness Brother   . Diabetes Brother   . Hypertension Brother   . Diabetes Brother   . Hypertension Brother   . Colon cancer Paternal Grandfather     age 2     Social History Mark Walls reports that he quit smoking about a year ago. His smoking use included Cigarettes. He has a 40 pack-year smoking history. He does not have any smokeless tobacco history on file. Mark Walls reports that he does not drink alcohol.   Review of Systems CONSTITUTIONAL: No weight loss, fever, chills, weakness or fatigue.  HEENT: Eyes: No visual loss, blurred vision, double vision or yellow sclerae.No hearing loss, sneezing, congestion, runny nose or sore throat.  SKIN: No rash or itching.  CARDIOVASCULAR: per HPI RESPIRATORY: No shortness of breath, cough or sputum.  GASTROINTESTINAL: No anorexia, nausea, vomiting or diarrhea. No abdominal pain or blood.  GENITOURINARY: No burning on urination, no polyuria NEUROLOGICAL: No headache, dizziness, syncope, paralysis, ataxia, numbness or tingling in the extremities. No change in bowel or bladder control.  MUSCULOSKELETAL: chronic knee pain LYMPHATICS: No enlarged nodes. No history of splenectomy.  PSYCHIATRIC: No history of depression or anxiety.  ENDOCRINOLOGIC: No reports of sweating, cold or heat intolerance. No polyuria or polydipsia.  Marland Kitchen   Physical Examination p 66 bp 145/67 Wt 216 lbs BMI 34 Gen: resting comfortably, no acute distress HEENT: no scleral icterus, pupils equal round  and reactive, no palptable cervical adenopathy,  CV: RRR, no m/r/g, no JVD, no carotid bruits Resp: Clear to auscultation bilaterally GI: abdomen is soft, non-tender, non-distended, normal bowel sounds, no hepatosplenomegaly MSK: extremities are warm, no edema.  Skin: warm, no rash Neuro:  no focal deficits Psych: appropriate affect   Diagnostic Studies 11/2013 CXR  No abnormality   11/2013 Labs: Cr 0.8 K 4.4 Hgb 13.9 Plt 214   2005 Cath  FINDINGS:  1. LV: 136/10/19. EF 65% without regional wall motion abnormal.  2. No aortic stenosis or mitral regurgitation.  3. Left main: Angiographically normal.  4. LAD: Moderate-sized vessel giving rise to three diagonal branches.  First two diagonals are quite small and the third is larger. The LAD  and its branches  are normal.  5. Circumflex: Large, codominant vessel giving rise of two obtuse  marginals in the PDA. It is angiographically normal. 6. RCA: Moderate-  sized, codominant vessel. It is angiographically normal.  6. Abdominal aorta: Normal abdominal aorta. It gives rise to single renal  arteries bilaterally. Both of these are normal. 8. Right leg: Severe  common iliac stenosis with peak gradient of 40 mmHg and a mean gradient  of 15 mmHg at rest. The remainder of the common iliac as well as the  internal iliac, external iliac, common femoral, and proximal portions of  the SFA and profunda are normal.  7. Left leg: There is a mild stenosis of the common iliac artery  (approximately 20%). The remainder of the common as well as the  internal iliac, external iliac, common femoral and proximal portion of  the SFA is normal. There is an approximately 50% stenosis of the  proximal profunda.  IMPRESSION/RECOMMENDATIONS:  1. Angiographically normal coronary arteries with normal LV size and  systolic function. No aortic stenosis or mitral regurgitation. I  suspect a non-cardiac etiology to his chest pain.  Despite these  negative findings, his atherosclerotic peripheral vascular disease  places him at high risk for subsequent coronary event. Thus, aggressive  secondary prevention efforts should be continued.  2. 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 Stenting is recommended.   01/21/14 Lexiscan Nuclear Stress Test  Tomographic views were obtained using the short axis, vertical long axis, and horizontal long axis planes. There was a moderate-sized, dense, inferior defect from apical to mid level, partial reversibility noted from mid to basal level. This is most suggestive of inferior scar with peri-infarct ischemia of mild degree in the mid to basal segment.  Gated imaging revealed an EDV of 82, ESV of 31, TID ratio 0.9, and LVEF of 63% without obvious wall motion abnormalities.  IMPRESSION: Abnormal, but relatively low risk Lexiscan Cardiolite as outlined. Patient was not able to ambulate on the treadmill to achieve adequate heart rate response due to shortness of breath and probable claudication. There were no diagnostic ST segment changes with Lexiscan. Perfusion imaging is most consistent with inferior wall scar and mild peri-infarct ischemia in the mid to basal segment. There is however normal wall motion in this region on gated imaging with LVEF 63% and normal LV volumes. Suggests RCA distribution ischemia.  01/2014 ABI  Right 1.1 Left 1.1   2/15 Echo  LVEF 09-47%, grade I diastolic dysfunction, technically difficult study       Assessment and Plan   1. Chest pain  - recent MPI low risk, suggestion of inferior scar with mild peri-infarct ischemia, normal wall motion and LVEF  - no recent symptoms, continue CAD risk factor modification and medical therapy.  2. Leg pain/PAD  - normal ABIs recently, MRI with evidence of lumbar disease that is likely etiology. Continue to follow with pcp  3. HTN  - elevated in  clinic, he reports recent change in bp meds by is PCP which he has not started yet. Continue to follow bp after chane.   4. Hyperlipidemia  - history of fatty liver with elevated LFTs, he has previously not been on statin. Will not start. He may need to be considered for pcsk9 inhibitor in the near future, reevaluate at next follow up  5. Preoperative evaluation - being considered for intermediate risk ortho surgery. He has no active acute cardiac conditions. Unable to assess METs by medical history, limited due to  severe knee pain. Recent echo with normal LVEF, recent low risk cardiac stress test. The stress test suggests potentially mild inferior wall ischemia that is overall low risk and managed medically, there is no indication for invasive testing at this time clinically or in the setting of upcoming surgery. Recommend proceeding with planned surgery, continue cardiac meds in perioperative period.  6. OSA - we will follow up with sleep center to see where he is with his workup   F/u 4 months   Arnoldo Lenis, M.D.

## 2014-11-05 NOTE — Telephone Encounter (Signed)
Called patient and left message for them to return call at the office   

## 2014-11-10 NOTE — Telephone Encounter (Signed)
Letter sent to call office 

## 2014-11-16 ENCOUNTER — Other Ambulatory Visit: Payer: Self-pay

## 2014-11-16 MED ORDER — CANAGLIFLOZIN 300 MG PO TABS: 300.0000 mg | ORAL_TABLET | Freq: Every day | ORAL | Status: DC

## 2014-11-16 NOTE — Telephone Encounter (Signed)
Pt aware and will bring meds but he has to go to [facility] for family trouble but will try to get here next week

## 2014-11-17 ENCOUNTER — Other Ambulatory Visit: Payer: Self-pay | Admitting: Family Medicine

## 2014-11-18 ENCOUNTER — Encounter (HOSPITAL_BASED_OUTPATIENT_CLINIC_OR_DEPARTMENT_OTHER): Payer: Medicaid Other

## 2014-12-08 ENCOUNTER — Ambulatory Visit: Payer: Medicaid Other | Admitting: Family Medicine

## 2015-01-12 ENCOUNTER — Ambulatory Visit: Payer: Medicaid Other | Admitting: Family Medicine

## 2015-01-14 ENCOUNTER — Ambulatory Visit: Payer: Medicaid Other | Admitting: Family Medicine

## 2015-01-20 ENCOUNTER — Ambulatory Visit: Payer: Medicaid Other | Admitting: Family Medicine

## 2015-01-27 ENCOUNTER — Ambulatory Visit (INDEPENDENT_AMBULATORY_CARE_PROVIDER_SITE_OTHER): Payer: Medicaid Other | Admitting: Family Medicine

## 2015-01-27 ENCOUNTER — Encounter: Payer: Self-pay | Admitting: Family Medicine

## 2015-01-27 VITALS — BP 140/80 | HR 74 | Resp 18 | Ht 67.0 in | Wt 215.1 lb

## 2015-01-27 DIAGNOSIS — E785 Hyperlipidemia, unspecified: Secondary | ICD-10-CM

## 2015-01-27 DIAGNOSIS — Z23 Encounter for immunization: Secondary | ICD-10-CM

## 2015-01-27 DIAGNOSIS — Z125 Encounter for screening for malignant neoplasm of prostate: Secondary | ICD-10-CM

## 2015-01-27 DIAGNOSIS — Z91199 Patient's noncompliance with other medical treatment and regimen due to unspecified reason: Secondary | ICD-10-CM

## 2015-01-27 DIAGNOSIS — E1165 Type 2 diabetes mellitus with hyperglycemia: Secondary | ICD-10-CM

## 2015-01-27 DIAGNOSIS — E669 Obesity, unspecified: Secondary | ICD-10-CM

## 2015-01-27 DIAGNOSIS — I10 Essential (primary) hypertension: Secondary | ICD-10-CM

## 2015-01-27 DIAGNOSIS — IMO0002 Reserved for concepts with insufficient information to code with codable children: Secondary | ICD-10-CM

## 2015-01-27 DIAGNOSIS — Z9119 Patient's noncompliance with other medical treatment and regimen: Secondary | ICD-10-CM

## 2015-01-27 MED ORDER — GLIPIZIDE ER 5 MG PO TB24: 5.0000 mg | ORAL_TABLET | Freq: Every day | ORAL | Status: DC

## 2015-01-27 NOTE — Assessment & Plan Note (Signed)
Patient advised to reduce carb and sweets, commit to regular physical activity, take meds as prescribed, test blood as directed, and attempt to lose weight, to improve blood sugar control. Non compliant with treatment, taking as he feels necessary. Lower dose of glipizide. Need to reintroduce ARB for renal protection will let hm know with blood sugar result , also blod pressure currently ot at goal though improved

## 2015-01-27 NOTE — Assessment & Plan Note (Signed)
Unchanged. Patient re-educated about  the importance of commitment to a  minimum of 150 minutes of exercise per week. The importance of healthy food choices with portion control discussed. Encouraged to start a food diary, count calories and to consider  joining a support group. Sample diet sheets offered. Goals set by the patient for the next several months.    

## 2015-01-27 NOTE — Assessment & Plan Note (Signed)
Improved, though not at goal DASH diet and commitment to daily physical activity for a minimum of 30 minutes discussed and encouraged, as a part of hypertension management. The importance of attaining a healthy weight is also discussed. Adding cozaar 50mg  hopefully he will comply

## 2015-01-27 NOTE — Patient Instructions (Signed)
F/u in 3 month, call if you need me ebfore  For blood sugar ,  NEW is glipizide ER 5 mg one daily, always take janumet two daily  Labs today   Prevnar today

## 2015-01-27 NOTE — Assessment & Plan Note (Signed)
Hyperlipidemia:Low fat diet discussed and encouraged.  Updated lab needed at/ before next visit.today  Uncontrolled currently

## 2015-01-27 NOTE — Assessment & Plan Note (Signed)
Vaccine administered at visit.  

## 2015-01-27 NOTE — Assessment & Plan Note (Signed)
Is not taking medications prescribed ,a nd those which are prescries , he takes on  A dise adjusted by himself, esp liid lowering meds and diabetic meds, which is very dangerous and ill advised, i explained this

## 2015-01-27 NOTE — Progress Notes (Signed)
Subjective:    Patient ID: Mark Walls, male    DOB: [birth year], 59 y.o.   MRN: [id number]  HPI The PT is here for follow up and re-evaluation of chronic medical conditions, medication management and review of any available recent lab and radiology data.  Preventive health is updated, specifically  Cancer screening and Immunization.   Questions or concerns regarding consultations or procedures which the PT has had in the interim are  Addressed.Has seen cardiologist now is on metoprolol, needs to getr fisted CPAP, he does have severe sleep apnea The PT denies any adverse reactions to current medications since the last visit.  Reports widely fluctuating "based on wha he eats" and he in turn has "adjusted and takwn control of his med managemen " which is of course very inappropriate. Updatde labs today       Review of Systems See HPI Denies recent fever or chills. Denies sinus pressure, nasal congestion, ear pain or sore throat. Denies chest congestion, productive cough or wheezing. Denies chest pains, palpitations and leg swelling Denies abdominal pain, nausea, vomiting,diarrhea or constipation.   Denies dysuria, frequency, hesitancy or incontinence. Denies uncontrolled  joint pain, swelling and limitation in mobility. Denies headaches, seizures, numbness, or tingling. Denies uncontrolled  depression, anxiety or insomnia. Denies skin break down or rash.        Objective:   Physical Exam BP 140/80 mmHg  Pulse 74  Resp 18  Ht 5\' 7"  (1.702 m)  Wt 215 lb 1.3 oz (97.56 kg)  BMI 33.68 kg/m2  SpO2 95% Patient alert and oriented and in no cardiopulmonary distress.  HEENT: No facial asymmetry, EOMI,   oropharynx pink and moist.  Neck supple no JVD, no mass.  Chest: Clear to auscultation bilaterally.  CVS: S1, S2 no murmurs, no S3.Regular rate.  ABD: Soft non tender.   Ext: No edema  MS: Adequate ROM spine, shoulders, hips and knees.  Skin: Intact, no ulcerations or  rash noted.  Psych: Good eye contact, normal affect. Memory intact not anxious or depressed appearing.  CNS: CN 2-12 intact, power,  normal throughout.no focal deficits noted.        Assessment & Plan:  Need for vaccination with 13-polyvalent pneumococcal conjugate vaccine Vaccine administered at visit.    Type 2 diabetes mellitus, uncontrolled Patient advised to reduce carb and sweets, commit to regular physical activity, take meds as prescribed, test blood as directed, and attempt to lose weight, to improve blood sugar control. Non compliant with treatment, taking as he feels necessary. Lower dose of glipizide. Need to reintroduce ARB for renal protection will let hm know with blood sugar result , also blod pressure currently ot at goal though improved   Hyperlipidemia LDL goal <100 Hyperlipidemia:Low fat diet discussed and encouraged.  Updated lab needed at/ before next visit.today  Uncontrolled currently   Essential hypertension Improved, though not at goal DASH diet and commitment to daily physical activity for a minimum of 30 minutes discussed and encouraged, as a part of hypertension management. The importance of attaining a healthy weight is also discussed. Adding cozaar 50mg  hopefully he will comply   Obesity (BMI 30.0-34.9) Unchanged Patient re-educated about  the importance of commitment to a  minimum of 150 minutes of exercise per week. The importance of healthy food choices with portion control discussed. Encouraged to start a food diary, count calories and to consider  joining a support group. Sample diet sheets offered. Goals set by the patient for the next several  months.      Medically noncompliant Is not taking medications prescribed ,a nd those which are prescries , he takes on  A dise adjusted by himself, esp liid lowering meds and diabetic meds, which is very dangerous and ill advised, i explained this

## 2015-01-28 LAB — COMPLETE METABOLIC PANEL WITH GFR
ALT: 116 U/L — AB (ref 0–53)
AST: 46 U/L — ABNORMAL HIGH (ref 0–37)
Albumin: 4.1 g/dL (ref 3.5–5.2)
Alkaline Phosphatase: 63 U/L (ref 39–117)
BUN: 13 mg/dL (ref 6–23)
CALCIUM: 10.2 mg/dL (ref 8.4–10.5)
CHLORIDE: 101 meq/L (ref 96–112)
CO2: 30 meq/L (ref 19–32)
Creat: 0.87 mg/dL (ref 0.50–1.35)
GFR, Est Non African American: >89 mL/min
Glucose, Bld: 126 mg/dL — ABNORMAL HIGH (ref 70–99)
Potassium: 4.7 mEq/L (ref 3.5–5.3)
SODIUM: 139 meq/L (ref 135–145)
TOTAL PROTEIN: 7.3 g/dL (ref 6.0–8.3)
Total Bilirubin: 0.5 mg/dL (ref 0.2–1.2)

## 2015-01-28 LAB — LIPID PANEL
Cholesterol: 212 mg/dL — ABNORMAL HIGH (ref 0–200)
HDL: 33 mg/dL — ABNORMAL LOW (ref >39)
LDL Cholesterol: 128 mg/dL — ABNORMAL HIGH (ref 0–99)
Total CHOL/HDL Ratio: 6.4 Ratio
Triglycerides: 256 mg/dL — ABNORMAL HIGH (ref <150)
VLDL: 51 mg/dL — ABNORMAL HIGH (ref 0–40)

## 2015-01-28 LAB — HEMOGLOBIN A1C
Hgb A1c MFr Bld: 7.6 % — ABNORMAL HIGH (ref <5.7)
MEAN PLASMA GLUCOSE: 171 mg/dL — AB (ref <117)

## 2015-01-28 LAB — PSA: PSA: 2.16 ng/mL (ref <=4.00)

## 2015-02-09 ENCOUNTER — Other Ambulatory Visit: Payer: Self-pay

## 2015-02-09 MED ORDER — SITAGLIPTIN PHOS-METFORMIN HCL 50-1000 MG PO TABS: ORAL_TABLET | ORAL | Status: DC

## 2015-02-09 MED ORDER — PRAVASTATIN SODIUM 10 MG PO TABS: ORAL_TABLET | ORAL | Status: DC

## 2015-02-16 ENCOUNTER — Other Ambulatory Visit: Payer: Self-pay | Admitting: Family Medicine

## 2015-02-25 ENCOUNTER — Encounter: Payer: Self-pay | Admitting: Cardiology

## 2015-02-25 ENCOUNTER — Ambulatory Visit (INDEPENDENT_AMBULATORY_CARE_PROVIDER_SITE_OTHER): Payer: Medicaid Other | Admitting: Cardiology

## 2015-02-25 VITALS — BP 144/80 | HR 70 | Ht 67.0 in | Wt 215.0 lb

## 2015-02-25 DIAGNOSIS — I10 Essential (primary) hypertension: Secondary | ICD-10-CM

## 2015-02-25 DIAGNOSIS — R0789 Other chest pain: Secondary | ICD-10-CM

## 2015-02-25 DIAGNOSIS — M79606 Pain in leg, unspecified: Secondary | ICD-10-CM

## 2015-02-25 DIAGNOSIS — E785 Hyperlipidemia, unspecified: Secondary | ICD-10-CM

## 2015-02-25 NOTE — Patient Instructions (Signed)
Your physician wants you to follow-up in: 6 months with Dr. Bryna Colander will receive a reminder letter in the mail two months in advance. If you don't receive a letter, please call our office to schedule the follow-up appointment.  Your physician has recommended you make the following change in your medication:   STOP TAKING PRAVASTATIN   CONTINUE ALL OTHER MEDICATIONS AS DIRECTED   Your physician has requested that you regularly monitor and record your blood pressure readings at home FOR 1 [place]. Please use the same machine at the same time of day to check your readings and record them to bring to your follow-up visit.  Thank you for choosing [facility]!!

## 2015-02-25 NOTE — Progress Notes (Signed)
Clinical Summary Mark Walls is a 59 y.o.male seen today for follow up of the following medical problems.   1. Chest pain  - long history of chest pain described last visit, mixed in characteristics for cardiac chest pain - 01/2014 Lexiscan MPI, moderate sized inferior defect with mild reversibility, LVEF 63%, overall low risk. This has been managed medically thus far - denies any recent chest pain.  - compliant with meds   2. Hyperlipidemia  - elevated LFTs, fatty liver per PCP notes. Statins also cause some fatigue, currently not on.  - 01/2015 TC 212 TG 256 HDL 33 LDL 128    3. HTN  - checks occasionally at home, typically 140s/70s - compliant with meds, but has not taken meds yet today.   4. PAD  - prior stent placed 2005, right common iliac  - currently having nonclaudication like leg pain. Normal ABIs 01/2014 - MRI has shown lumbar disease and foraminal impingement, potential etiology  5. COPD  - compliant with inhalers  6. OSA - he has had recent sleep study but reports he has not had follow up CPAP titration study    Past Medical History  Diagnosis Date  . PVD (peripheral vascular disease)   . ED (erectile dysfunction)   . Anxiety   . Depression   . Obesity   . Chronic fatigue   . Nicotine addiction   . Diabetes mellitus, type 2   . Hyperlipidemia   . Hypertension      Allergies  Allergen Reactions  . Allopurinol Nausea And Vomiting  . Levofloxacin Nausea And Vomiting  . Statins Other (See Comments)    Fatty liver, markedly elevated LFT  . Sulfamethoxazole-Trimethoprim Nausea And Vomiting  . Codeine Rash     Current Outpatient Prescriptions  Medication Sig Dispense Refill  . ACCU-CHEK AVIVA PLUS test strip TEST BLOOD SUGAR ONCE DAILY. 50 each 3  . albuterol (PROVENTIL HFA;VENTOLIN HFA) 108 (90 BASE) MCG/ACT inhaler Inhale 2 puffs into the lungs every 6 (six) hours as needed for wheezing or shortness of breath. 1 Inhaler 2  .  ALPRAZolam (XANAX) 1 MG tablet Take 1 mg by mouth 4 (four) times daily as needed for anxiety.    Marland Kitchen amphetamine-dextroamphetamine (ADDERALL) 10 MG tablet Take 10 mg by mouth 3 (three) times daily.     Marland Kitchen aspirin 81 MG tablet Take 81 mg by mouth daily.    . budesonide-formoterol (SYMBICORT) 80-4.5 MCG/ACT inhaler Inhale 2 puffs into the lungs 2 (two) times daily. 1 Inhaler 12  . glipiZIDE (GLUCOTROL XL) 5 MG 24 hr tablet Take 1 tablet (5 mg total) by mouth daily with breakfast. 30 tablet 4  . losartan (COZAAR) 50 MG tablet Take 1 tablet (50 mg total) by mouth daily. 30 tablet 4  . metoprolol tartrate (LOPRESSOR) 25 MG tablet Take 0.5 tablets (12.5 mg total) by mouth 2 (two) times daily. 90 tablet 3  . pravastatin (PRAVACHOL) 10 MG tablet One tablet 3 times a week on Monday, Wednesday, and Friday 12 tablet 0  . sitaGLIPtin-metformin (JANUMET) 50-1000 MG per tablet TAKE 2 TABLETS BY MOUTH ONCE DAILY. 60 tablet 2  . SitaGLIPtin-MetFORMIN HCl 50-1000 MG TB24 Take 2 tablets by mouth daily. 60 tablet 5  . vardenafil (LEVITRA) 20 MG tablet Take 20 mg by mouth daily as needed.      Marland Kitchen ZETIA 10 MG tablet TAKE (1) TABLET BY MOUTH ONCE DAILY. 30 tablet 3   No current facility-administered medications for this visit.  Past Surgical History  Procedure Laterality Date  . Cholecystectomy    . Polpectomy colon    . Esophagogastroduodenoscopy  09/24/2002    [name]:gastritis of the antrum and the bodt of the stomach otherwise normal  . Left knee athroscopy  03/2008  . Rotator cuff repair  09/01/08  . Right elbow    . Left hand    . Colonoscopy  10/01/2002    [name]:small internal hemorrhoids/single small sessile polyp in the rectum/ otherwise normal. Hyperplastic polyp.  . Right common iliac artery stenting    . Colonoscopy with esophagogastroduodenoscopy (egd) N/A 08/19/2013    PQZ:[id number] EG junction. Hiatal hernia. Abnormal gastric mucosa s/p bx: Internal hemorrhoids;  Otherwise, normal ileocolonoscopy -  Status post segmental biopsy     Allergies  Allergen Reactions  . Allopurinol Nausea And Vomiting  . Levofloxacin Nausea And Vomiting  . Statins Other (See Comments)    Fatty liver, markedly elevated LFT  . Sulfamethoxazole-Trimethoprim Nausea And Vomiting  . Codeine Rash      Family History  Problem Relation Age of Onset  . Cirrhosis Mother   . Melanoma Mother   . Mental illness Brother   . Mental illness Father     suicide   . Melanoma Brother   . Mental illness Brother   . Diabetes Brother   . Hypertension Brother   . Diabetes Brother   . Hypertension Brother   . Colon cancer Paternal Grandfather     age 35     Social History Mr. Roam reports that he quit smoking about 15 months ago. His smoking use included Cigarettes. He started smoking about 44 years ago. He has a 40 pack-year smoking history. He has never used smokeless tobacco. Mr. Motta reports that he does not drink alcohol.   Review of Systems CONSTITUTIONAL: No weight loss, fever, chills, weakness or fatigue.  HEENT: Eyes: No visual loss, blurred vision, double vision or yellow sclerae.No hearing loss, sneezing, congestion, runny nose or sore throat.  SKIN: No rash or itching.  CARDIOVASCULAR: per [facility] RESPIRATORY: No shortness of breath, cough or sputum.  GASTROINTESTINAL: No anorexia, nausea, vomiting or diarrhea. No abdominal pain or blood.  GENITOURINARY: No burning on urination, no polyuria NEUROLOGICAL: No headache, dizziness, syncope, paralysis, ataxia, numbness or tingling in the extremities. No change in bowel or bladder control.  MUSCULOSKELETAL: + leg pain LYMPHATICS: No enlarged nodes. No history of splenectomy.  PSYCHIATRIC: No history of depression or anxiety.  ENDOCRINOLOGIC: No reports of sweating, cold or heat intolerance. No polyuria or polydipsia.  Marland Kitchen   Physical Examination p 70 bp 144/80 Wt 215 lbs BMI 34 Gen: resting comfortably, no acute distress HEENT: no scleral  icterus, pupils equal round and reactive, no palptable cervical adenopathy,  CV: RRR, no m/r/g, no JVD, no carotid bruits Resp: Clear to auscultation bilaterally GI: abdomen is soft, non-tender, non-distended, normal bowel sounds, no hepatosplenomegaly MSK: extremities are warm, no edema.  Skin: warm, no rash Neuro:  no focal deficits Psych: appropriate affect   Diagnostic Studies 11/2013 CXR  No abnormality   11/2013 Labs: Cr 0.8 K 4.4 Hgb 13.9 Plt 214   2005 Cath  FINDINGS:  1. LV: 136/10/19. EF 65% without regional wall motion abnormal.  2. No aortic stenosis or mitral regurgitation.  3. Left main: Angiographically normal.  4. LAD: Moderate-sized vessel giving rise to three diagonal branches.  First two diagonals are quite small and the third is larger. The LAD  and its branches are normal.  5.  Circumflex: Large, codominant vessel giving rise of two obtuse  marginals in the PDA. It is angiographically normal. 6. RCA: Moderate-  sized, codominant vessel. It is angiographically normal.  6. Abdominal aorta: Normal abdominal aorta. It gives rise to single renal  arteries bilaterally. Both of these are normal. 8. Right leg: Severe  common iliac stenosis with peak gradient of 40 mmHg and a mean gradient  of 15 mmHg at rest. The remainder of the common iliac as well as the  internal iliac, external iliac, common femoral, and proximal portions of  the SFA and profunda are normal.  7. Left leg: There is a mild stenosis of the common iliac artery  (approximately 20%). The remainder of the common as well as the  internal iliac, external iliac, common femoral and proximal portion of  the SFA is normal. There is an approximately 50% stenosis of the  proximal profunda.  IMPRESSION/RECOMMENDATIONS:  1. Angiographically normal coronary arteries with normal LV size and  systolic function. No aortic stenosis or mitral regurgitation. I  suspect a non-cardiac  etiology to his chest pain. Despite these  negative findings, his atherosclerotic peripheral vascular disease  places him at high risk for subsequent coronary event. Thus, aggressive  secondary prevention efforts should be continued.  2. 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 Stenting is recommended.   01/21/14 Lexiscan Nuclear Stress Test  Tomographic views were obtained using the short axis, vertical long axis, and horizontal long axis planes. There was a moderate-sized, dense, inferior defect from apical to mid level, partial reversibility noted from mid to basal level. This is most suggestive of inferior scar with peri-infarct ischemia of mild degree in the mid to basal segment.  Gated imaging revealed an EDV of 82, ESV of 31, TID ratio 0.9, and LVEF of 63% without obvious wall motion abnormalities.  IMPRESSION: Abnormal, but relatively low risk Lexiscan Cardiolite as outlined. Patient was not able to ambulate on the treadmill to achieve adequate heart rate response due to shortness of breath and probable claudication. There were no diagnostic ST segment changes with Lexiscan. Perfusion imaging is most consistent with inferior wall scar and mild peri-infarct ischemia in the mid to basal segment. There is however normal wall motion in this region on gated imaging with LVEF 63% and normal LV volumes. Suggests RCA distribution ischemia.  01/2014 ABI  Right 1.1 Left 1.1   2/15 Echo  LVEF 32-35%, grade I diastolic dysfunction, technically difficult study    Assessment and Plan  1. Chest pain  - recent MPI low risk, suggestion of inferior scar with mild peri-infarct ischemia, normal wall motion and LVEF  - no recent symptoms, continue CAD risk factor modification and medical therapy.  2. Leg pain/PAD  - normal ABIs recently, MRI with evidence of lumbar disease that is likely etiology. Continue to follow with pcp  3. HTN   - above goal in clinic given his history of DM2. I have asked him to keep bp log x 1 week, further titration of meds based on those results.   4. Hyperlipidemia  - unable to tolerate statins including most recently low dose pravastatin, continue zetia.   5. OSA - will touch base with pcp to see if they have heard any updates on his CPAP    F/u 6 months  Arnoldo Lenis, M.D.

## 2015-03-01 ENCOUNTER — Telehealth: Payer: Self-pay | Admitting: Family Medicine

## 2015-03-01 DIAGNOSIS — G4733 Obstructive sleep apnea (adult) (pediatric): Secondary | ICD-10-CM

## 2015-03-01 NOTE — Telephone Encounter (Signed)
In Sept 2015, pt needed CPAP titration per record by Dr Elsworth Soho. Cardiology has sent a msg to me today asking  if I have any update, he does have sleep apnea  Pls call pt, confirm that he is still not using CPAP if not needs to f/u with Dr Elsworth Soho (pulmonologist he saw last year) to get this started , refer back for OV if needed, I will sign. I don't see that he needs an Ov, he needs to follow through and get the machine   ??/ pls ask

## 2015-03-02 NOTE — Addendum Note (Signed)
Addended by: Denman George B on: 03/02/2015 11:43 AM   Modules accepted: Orders

## 2015-03-02 NOTE — Telephone Encounter (Signed)
Spoke with patient and he states that he does not use a CPAP machine but is aware of the need for one.  He is ok with referral to Dr. Elsworth Soho.  Referral entered.

## 2015-03-25 ENCOUNTER — Other Ambulatory Visit: Payer: Self-pay | Admitting: Family Medicine

## 2015-05-07 ENCOUNTER — Institutional Professional Consult (permissible substitution): Payer: MEDICAID | Admitting: Pulmonary Disease

## 2015-05-19 ENCOUNTER — Other Ambulatory Visit: Payer: Self-pay | Admitting: Family Medicine

## 2015-05-31 ENCOUNTER — Ambulatory Visit: Payer: Medicaid Other | Admitting: Orthopedic Surgery

## 2015-06-08 ENCOUNTER — Ambulatory Visit (INDEPENDENT_AMBULATORY_CARE_PROVIDER_SITE_OTHER): Payer: Medicaid Other | Admitting: Family Medicine

## 2015-06-08 ENCOUNTER — Encounter: Payer: Self-pay | Admitting: Family Medicine

## 2015-06-08 VITALS — BP 138/70 | HR 78 | Resp 16 | Ht 67.0 in | Wt 215.0 lb

## 2015-06-08 DIAGNOSIS — Z9119 Patient's noncompliance with other medical treatment and regimen: Secondary | ICD-10-CM

## 2015-06-08 DIAGNOSIS — M5432 Sciatica, left side: Secondary | ICD-10-CM | POA: Diagnosis not present

## 2015-06-08 DIAGNOSIS — E119 Type 2 diabetes mellitus without complications: Secondary | ICD-10-CM

## 2015-06-08 DIAGNOSIS — E669 Obesity, unspecified: Secondary | ICD-10-CM

## 2015-06-08 DIAGNOSIS — I10 Essential (primary) hypertension: Secondary | ICD-10-CM

## 2015-06-08 DIAGNOSIS — E1165 Type 2 diabetes mellitus with hyperglycemia: Secondary | ICD-10-CM | POA: Diagnosis not present

## 2015-06-08 DIAGNOSIS — E1169 Type 2 diabetes mellitus with other specified complication: Secondary | ICD-10-CM

## 2015-06-08 DIAGNOSIS — E785 Hyperlipidemia, unspecified: Secondary | ICD-10-CM

## 2015-06-08 DIAGNOSIS — Z91199 Patient's noncompliance with other medical treatment and regimen due to unspecified reason: Secondary | ICD-10-CM

## 2015-06-08 DIAGNOSIS — F329 Major depressive disorder, single episode, unspecified: Secondary | ICD-10-CM

## 2015-06-08 DIAGNOSIS — F32A Depression, unspecified: Secondary | ICD-10-CM

## 2015-06-08 MED ORDER — IBUPROFEN 800 MG PO TABS: 800.0000 mg | ORAL_TABLET | Freq: Three times a day (TID) | ORAL | Status: DC | PRN

## 2015-06-08 MED ORDER — GABAPENTIN 300 MG PO CAPS: 300.0000 mg | ORAL_CAPSULE | Freq: Two times a day (BID) | ORAL | Status: DC

## 2015-06-08 MED ORDER — PREDNISONE 5 MG PO TABS: 5.0000 mg | ORAL_TABLET | Freq: Two times a day (BID) | ORAL | Status: AC

## 2015-06-08 MED ORDER — KETOROLAC TROMETHAMINE 60 MG/2ML IM SOLN
60.0000 mg | Freq: Once | INTRAMUSCULAR | Status: AC
  Administered 2015-06-08: 60 mg via INTRAMUSCULAR

## 2015-06-08 NOTE — Patient Instructions (Signed)
F/u in 3 month, call if you nee  Me before  New for back pain is gabapentin twice daily, continue pain meds as before from Dr Luna Glasgow   Toradol in office today and short course of ibuprofen and prednisone  It is important that you exercise regularly at least 30 minutes 5 times a week. If you develop chest pain, have severe difficulty breathing, or feel very tired, stop exercising immediately and seek medical attention    Labs today  Please work on good  health habits so that your health will improve. 1. Commitment to daily physical activity for 30 to 60  minutes, if you are able to do this.  2. Commitment to wise food choices. Aim for half of your  food intake to be vegetable and fruit, one quarter starchy foods, and one quarter protein. Try to eat on a regular schedule  3 meals per day, snacking between meals should be limited to vegetables or fruits or small portions of nuts. 64 ounces of water per day is generally recommended, unless you have specific health conditions, like heart failure or kidney failure where you will need to limit fluid intake.  3. Commitment to sufficient and a  good quality of physical and mental rest daily, generally between 6 to 8 hours per day.  WITH PERSISTANCE AND PERSEVERANCE, THE IMPOSSIBLE , BECOMES THE NORM! Thanks for choosing [facility], we consider it a privelige to serve you.

## 2015-06-08 NOTE — Assessment & Plan Note (Addendum)
toradol in office , and meds sent in, new is gabapentin

## 2015-06-10 ENCOUNTER — Ambulatory Visit: Payer: Medicaid Other | Admitting: Orthopedic Surgery

## 2015-06-22 ENCOUNTER — Other Ambulatory Visit: Payer: Self-pay | Admitting: Family Medicine

## 2015-06-23 ENCOUNTER — Other Ambulatory Visit: Payer: Self-pay | Admitting: Family Medicine

## 2015-08-03 DIAGNOSIS — Z8719 Personal history of other diseases of the digestive system: Secondary | ICD-10-CM | POA: Insufficient documentation

## 2015-08-04 ENCOUNTER — Telehealth: Payer: Self-pay | Admitting: *Deleted

## 2015-08-04 NOTE — Telephone Encounter (Signed)
Pt came in he needs another referral to Dr. Benjamine Mola of his neck and back, pt has medicaid  Tracks needs to be done for this appt

## 2015-08-05 NOTE — Telephone Encounter (Signed)
I dont see any reason to be seeing Teoh for his neck and back. All numbers in chart are disconnected. WIll wait on call back

## 2015-08-09 ENCOUNTER — Other Ambulatory Visit: Payer: Self-pay | Admitting: Family Medicine

## 2015-09-09 ENCOUNTER — Ambulatory Visit: Payer: MEDICAID | Admitting: Family Medicine

## 2015-09-19 ENCOUNTER — Encounter: Payer: Self-pay | Admitting: Family Medicine

## 2015-09-19 NOTE — Progress Notes (Signed)
Mark Walls     MRN: [id number]      DOB: September 02, 1956   HPI Mark Walls is here for follow up and re-evaluation of chronic medical conditions, medication management and review of any available recent lab and radiology data.  Preventive health is updated, specifically  Cancer screening and Immunization.   Questions or concerns regarding consultations or procedures which the PT has had in the interim are  Addressed.Still sees ortho for chronic pain management The PT denies any adverse reactions to current medications since the last visit.  Increased back and left lower extremity pain, wantas a shot Denies polyuria, polydipsia, blurred vision , or hypoglycemic episodes.    ROS Denies recent fever or chills. Denies sinus pressure, nasal congestion, ear pain or sore throat. Denies chest congestion, productive cough or wheezing. Denies chest pains, palpitations and leg swelling Denies abdominal pain, nausea, vomiting,diarrhea or constipation.   Denies joint pain, swelling and limitation in mobility. Denies headaches, seizures, numbness, or tingling. Denies depression, anxiety or insomnia. Denies skin break down or rash.   PE  BP 138/70 mmHg  Pulse 78  Resp 16  Ht 5\' 7"  (1.702 m)  Wt 215 lb (97.523 kg)  BMI 33.67 kg/m2  SpO2 98%  Patient alert and oriented and in no cardiopulmonary distress.  HEENT: No facial asymmetry, EOMI,   oropharynx pink and moist.  Neck supple no JVD, no mass.  Chest: Clear to auscultation bilaterally.decreased air entry  CVS: S1, S2 no murmurs, no S3.Regular rate.  ABD: Soft non tender.   Ext: No edema  MS: Decreased  ROM lumbar  spine, adequate in shoulders, hips and knees.  Skin: Intact, no ulcerations or rash noted.  Psych: Good eye contact, normal affect. Memory intact not anxious or depressed appearing.  CNS: CN 2-12 intact, power,  normal throughout.no focal deficits noted.   Assessment & Plan   Back pain with left-sided  sciatica toradol in office , and meds sent in, new is gabapentin  Essential hypertension Controlled, no change in medication DASH diet and commitment to daily physical activity for a minimum of 30 minutes discussed and encouraged, as a part of hypertension management. The importance of attaining a healthy weight is also discussed.  BP/Weight 06/08/2015 02/25/2015 01/27/2015 11/04/2014 10/28/2014 08/30/2014 08/04/2014  Systolic BP 138 144 140 145 148 - 132  Diastolic BP 70 80 80 67 74 - 64  Wt. (Lbs) 215 215 215.08 216 214 215 215  BMI 33.67 33.67 33.68 33.82 33.51 33.67 33.67        Type 2 diabetes mellitus, uncontrolled Controlled, no change in medication Mark Walls is reminded of the importance of commitment to daily physical activity for 30 minutes or more, as able and the need to limit carbohydrate intake to 30 to 60 grams per meal to help with blood sugar control.   The need to take medication as prescribed, test blood sugar as directed, and to call between visits if there is a concern that blood sugar is uncontrolled is also discussed.   Mark Walls is reminded of the importance of daily foot exam, annual eye examination, and good blood sugar, blood pressure and cholesterol control.  Diabetic Labs Latest Ref Rng 01/27/2015 10/28/2014 10/05/2014 05/26/2014 01/20/2014  HbA1c <5.7 % 7.6(H) - 8.1(H) 7.2(H) 7.9(H)  Microalbumin <2.0 mg/dL - 0.6 - - -  Micro/Creat Ratio 0.0 - 30.0 mg/g - 3.4 - - -  Chol 0 - 200 mg/dL 409(W) - 119(J) 478(G) 290(H)  HDL >39  mg/dL 16(X) - 09(U) 43 04(V)  Calc LDL 0 - 99 mg/dL 409(W) - 119(J) 478(G) 199(H)  Triglycerides <150 mg/dL 956(O) - 130(Q) 657(Q) 271(H)  Creatinine 0.50 - 1.35 mg/dL 4.69 - 6.29 5.28 4.13   BP/Weight 06/08/2015 02/25/2015 01/27/2015 11/04/2014 10/28/2014 08/30/2014 08/04/2014  Systolic BP 138 144 140 145 148 - 132  Diastolic BP 70 80 80 67 74 - 64  Wt. (Lbs) 215 215 215.08 216 214 215 215  BMI 33.67 33.67 33.68 33.82 33.51 33.67 33.67    Foot/eye exam completion dates 10/28/2014 04/17/2013  Foot Form Completion Done Done       Updated lab needed    Hyperlipidemia LDL goal <100 Hyperlipidemia:Low fat diet discussed and encouraged.   Lipid Panel  Lab Results  Component Value Date   CHOL 212* 01/27/2015   HDL 33* 01/27/2015   LDLCALC 128* 01/27/2015   TRIG 256* 01/27/2015   CHOLHDL 6.4 01/27/2015   Uncontrolled, updated lab needed    Obesity (BMI 30.0-34.9) Unchnaged Patient re-educated about  the importance of commitment to a  minimum of 150 minutes of exercise per week.  The importance of healthy food choices with portion control discussed. Encouraged to start a food diary, count calories and to consider  joining a support group. Sample diet sheets offered. Goals set by the patient for the next several months.   Weight /BMI 06/08/2015 02/25/2015 01/27/2015  WEIGHT 215 lb 215 lb 215 lb 1.3 oz  HEIGHT 5\' 7"  5\' 7"  5\' 7"   BMI 33.67 kg/m2 33.67 kg/m2 33.68 kg/m2    Current exercise per week 90 minutes.   Medically noncompliant Ongoing problem which is discussed at visit

## 2015-09-19 NOTE — Assessment & Plan Note (Signed)
Controlled, no change in medication Mark Walls is reminded of the importance of commitment to daily physical activity for 30 minutes or more, as able and the need to limit carbohydrate intake to 30 to 60 grams per meal to help with blood sugar control.   The need to take medication as prescribed, test blood sugar as directed, and to call between visits if there is a concern that blood sugar is uncontrolled is also discussed.   Mark Walls is reminded of the importance of daily foot exam, annual eye examination, and good blood sugar, blood pressure and cholesterol control.  Diabetic Labs Latest Ref Rng 01/27/2015 10/28/2014 10/05/2014 05/26/2014 01/20/2014  HbA1c <5.7 % 7.6(H) - 8.1(H) 7.2(H) 7.9(H)  Microalbumin <2.0 mg/dL - 0.6 - - -  Micro/Creat Ratio 0.0 - 30.0 mg/g - 3.4 - - -  Chol 0 - 200 mg/dL 212(H) - 224(H) 233(H) 290(H)  HDL >39 mg/dL 33(L) - 38(L) 43 37(L)  Calc LDL 0 - 99 mg/dL 128(H) - 155(H) 156(H) 199(H)  Triglycerides <150 mg/dL 256(H) - 157(H) 169(H) 271(H)  Creatinine 0.50 - 1.35 mg/dL 0.87 - 0.90 0.88 0.69   BP/Weight 06/08/2015 02/25/2015 01/27/2015 11/04/2014 10/28/2014 08/30/2014 7/89/3810  Systolic BP 175 102 585 277 824 - 235  Diastolic BP 70 80 80 67 74 - 64  Wt. (Lbs) 215 215 215.08 216 214 215 215  BMI 33.67 33.67 33.68 33.82 33.51 33.67 33.67   Foot/eye exam completion dates 10/28/2014 04/17/2013  Foot Form Completion Done Done       Updated lab needed

## 2015-09-19 NOTE — Assessment & Plan Note (Signed)
Ongoing problem which is discussed at visit

## 2015-09-19 NOTE — Assessment & Plan Note (Signed)
Unchnaged Patient re-educated about  the importance of commitment to a  minimum of 150 minutes of exercise per week.  The importance of healthy food choices with portion control discussed. Encouraged to start a food diary, count calories and to consider  joining a support group. Sample diet sheets offered. Goals set by the patient for the next several months.   Weight /BMI 06/08/2015 02/25/2015 01/27/2015  WEIGHT 215 lb 215 lb 215 lb 1.3 oz  HEIGHT 5\' 7"  5\' 7"  5\' 7"   BMI 33.67 kg/m2 33.67 kg/m2 33.68 kg/m2    Current exercise per week 90 minutes.

## 2015-09-19 NOTE — Assessment & Plan Note (Signed)
Hyperlipidemia:Low fat diet discussed and encouraged.   Lipid Panel  Lab Results  Component Value Date   CHOL 212* 01/27/2015   HDL 33* 01/27/2015   LDLCALC 128* 01/27/2015   TRIG 256* 01/27/2015   CHOLHDL 6.4 01/27/2015   Uncontrolled, updated lab needed

## 2015-09-19 NOTE — Assessment & Plan Note (Signed)
Controlled, no change in medication DASH diet and commitment to daily physical activity for a minimum of 30 minutes discussed and encouraged, as a part of hypertension management. The importance of attaining a healthy weight is also discussed.  BP/Weight 06/08/2015 02/25/2015 01/27/2015 11/04/2014 10/28/2014 08/30/2014 0/30/0923  Systolic BP 300 762 263 335 456 - 256  Diastolic BP 70 80 80 67 74 - 64  Wt. (Lbs) 215 215 215.08 216 214 215 215  BMI 33.67 33.67 33.68 33.82 33.51 33.67 33.67

## 2015-09-29 ENCOUNTER — Other Ambulatory Visit: Payer: Self-pay | Admitting: Family Medicine

## 2015-10-11 ENCOUNTER — Ambulatory Visit (INDEPENDENT_AMBULATORY_CARE_PROVIDER_SITE_OTHER): Payer: Medicaid Other | Admitting: Cardiology

## 2015-10-11 ENCOUNTER — Encounter: Payer: Self-pay | Admitting: Cardiology

## 2015-10-11 VITALS — BP 144/72 | HR 64 | Ht 67.0 in | Wt 212.0 lb

## 2015-10-11 DIAGNOSIS — I10 Essential (primary) hypertension: Secondary | ICD-10-CM | POA: Diagnosis not present

## 2015-10-11 DIAGNOSIS — R0789 Other chest pain: Secondary | ICD-10-CM

## 2015-10-11 DIAGNOSIS — I739 Peripheral vascular disease, unspecified: Secondary | ICD-10-CM | POA: Diagnosis not present

## 2015-10-11 DIAGNOSIS — E785 Hyperlipidemia, unspecified: Secondary | ICD-10-CM

## 2015-10-11 NOTE — Progress Notes (Signed)
Patient ID: NITISH KESTEL, male   DOB: 1956-06-22, 59 y.o.   MRN: [id number]     Clinical Summary Mr. Dirosa is a 59 y.o.male seen today for follow up of the following medical problems.   1. Chest pain  - long history of chest pain described last visit, mixed in characteristics for cardiac chest pain -01/2014 Lexiscan MPI, moderate sized inferior defect with mild reversibility, LVEF 63%, overall low risk. This has been managed medically thus far   - notes some DOE with activities he attributes to severe joint pain and stiffness that increases his exertional level. Denies any chest pain.   2. Hyperlipidemia  - elevated LFTs, fatty liver per PCP notes. Statins also cause some fatigue. Currently on zetia 10mg  daily.  - 01/2015 TC 212 TG 256 HDL 33 LDL 128  3. HTN  - not checking at home - compliant with meds, but has not taken meds yet today.   4. PAD  - prior stent placed 2005, right common iliac  - currently having nonclaudication like leg pain. Normal ABIs 01/2014 - MRI has shown lumbar disease and foraminal impingement, potential etiology  5. COPD  - compliant with inhalers   Past Medical History  Diagnosis Date  . PVD (peripheral vascular disease) (HCC)   . ED (erectile dysfunction)   . Anxiety   . Depression   . Obesity   . Chronic fatigue   . Nicotine addiction   . Diabetes mellitus, type 2 (HCC)   . Hyperlipidemia   . Hypertension      Allergies  Allergen Reactions  . Allopurinol Nausea And Vomiting  . Levofloxacin Nausea And Vomiting  . Statins Other (See Comments)    Fatty liver, markedly elevated LFT  . Sulfamethoxazole-Trimethoprim Nausea And Vomiting  . Codeine Rash     Current Outpatient Prescriptions  Medication Sig Dispense Refill  . ACCU-CHEK AVIVA PLUS test strip TEST BLOOD SUGAR DAILY AS DIRECTED. 50 each 0  . albuterol (PROVENTIL HFA;VENTOLIN HFA) 108 (90 BASE) MCG/ACT inhaler Inhale 2 puffs into the lungs every 6 (six) hours as  needed for wheezing or shortness of breath. 1 Inhaler 2  . ALPRAZolam (XANAX) 1 MG tablet Take 1 mg by mouth 4 (four) times daily as needed for anxiety.    Marland Kitchen amphetamine-dextroamphetamine (ADDERALL) 10 MG tablet Take 10 mg by mouth 3 (three) times daily.     Marland Kitchen aspirin 81 MG tablet Take 81 mg by mouth daily.    Marland Kitchen gabapentin (NEURONTIN) 300 MG capsule Take 1 capsule (300 mg total) by mouth 2 (two) times daily. 60 capsule 3  . GLIPIZIDE XL 5 MG 24 hr tablet TAKE (1) TABLET ONCE DAILY WITH BREAKFAST. 30 tablet 3  . HYDROcodone-acetaminophen (NORCO) 7.5-325 MG per tablet Take 1 tablet by mouth 3 (three) times daily as needed for moderate pain.    Marland Kitchen ibuprofen (ADVIL,MOTRIN) 800 MG tablet Take 1 tablet (800 mg total) by mouth every 8 (eight) hours as needed. 30 tablet 0  . JANUMET 50-1000 MG per tablet TAKE 2 TABLETS BY MOUTH DAILY. 60 tablet 2  . losartan (COZAAR) 50 MG tablet Take 1 tablet (50 mg total) by mouth daily. 30 tablet 4  . metoprolol tartrate (LOPRESSOR) 25 MG tablet Take 0.5 tablets (12.5 mg total) by mouth 2 (two) times daily. 90 tablet 3  . sitaGLIPtin-metformin (JANUMET) 50-1000 MG per tablet Take 1 tablet by mouth 2 (two) times daily with a meal.    . SYMBICORT 80-4.5 MCG/ACT inhaler INHALE  2 PUFFS BY MOUTH TWICE DAILY. 10.2 g 2  . vardenafil (LEVITRA) 20 MG tablet Take 20 mg by mouth daily as needed.      Marland Kitchen ZETIA 10 MG tablet TAKE (1) TABLET BY MOUTH ONCE DAILY. 30 tablet 3   No current facility-administered medications for this visit.     Past Surgical History  Procedure Laterality Date  . Cholecystectomy    . Polpectomy colon    . Esophagogastroduodenoscopy  09/24/2002    [name]:gastritis of the antrum and the bodt of the stomach otherwise normal  . Left knee athroscopy  03/2008  . Rotator cuff repair  09/01/08  . Right elbow    . Left hand    . Colonoscopy  10/01/2002    [name]:small internal hemorrhoids/single small sessile polyp in the rectum/ otherwise normal.  Hyperplastic polyp.  . Right common iliac artery stenting    . Colonoscopy with esophagogastroduodenoscopy (egd) N/A 08/19/2013    ZOX:[id number] EG junction. Hiatal hernia. Abnormal gastric mucosa s/p bx: Internal hemorrhoids;  Otherwise, normal ileocolonoscopy - Status post segmental biopsy     Allergies  Allergen Reactions  . Allopurinol Nausea And Vomiting  . Levofloxacin Nausea And Vomiting  . Statins Other (See Comments)    Fatty liver, markedly elevated LFT  . Sulfamethoxazole-Trimethoprim Nausea And Vomiting  . Codeine Rash      Family History  Problem Relation Age of Onset  . Cirrhosis Mother   . Melanoma Mother   . Mental illness Brother   . Mental illness Father     suicide   . Melanoma Brother   . Mental illness Brother   . Diabetes Brother   . Hypertension Brother   . Diabetes Brother   . Hypertension Brother   . Colon cancer Paternal Grandfather     age 59     Social History Mr. Ligman reports that he quit smoking about 22 months ago. His smoking use included Cigarettes. He started smoking about 45 years ago. He has a 40 pack-year smoking history. He has never used smokeless tobacco. Mr. Klimczak reports that he does not drink alcohol.   Review of Systems CONSTITUTIONAL: No weight loss, fever, chills, weakness or fatigue.  HEENT: Eyes: No visual loss, blurred vision, double vision or yellow sclerae.No hearing loss, sneezing, congestion, runny nose or sore throat.  SKIN: No rash or itching.  CARDIOVASCULAR: per HPI RESPIRATORY: No shortness of breath, cough or sputum.  GASTROINTESTINAL: No anorexia, nausea, vomiting or diarrhea. No abdominal pain or blood.  GENITOURINARY: No burning on urination, no polyuria NEUROLOGICAL: No headache, dizziness, syncope, paralysis, ataxia, numbness or tingling in the extremities. No change in bowel or bladder control.  MUSCULOSKELETAL: leg pains LYMPHATICS: No enlarged nodes. No history of splenectomy.  PSYCHIATRIC: No  history of depression or anxiety.  ENDOCRINOLOGIC: No reports of sweating, cold or heat intolerance. No polyuria or polydipsia.  Marland Kitchen   Physical Examination Filed Vitals:   10/11/15 0932  BP: 144/72  Pulse: 64   Filed Vitals:   10/11/15 0932  Height: 5\' 7"  (1.702 m)  Weight: 212 lb (96.163 kg)    Gen: resting comfortably, no acute distress HEENT: no scleral icterus, pupils equal round and reactive, no palptable cervical adenopathy,  CV: RRR, no m/r/g, no jvd Resp: Clear to auscultation bilaterally GI: abdomen is soft, non-tender, non-distended, normal bowel sounds, no hepatosplenomegaly MSK: extremities are warm, no edema.  Skin: warm, no rash Neuro:  no focal deficits Psych: appropriate affect   Diagnostic Studies 11/2013 CXR  No abnormality   11/2013 Labs: Cr 0.8 K 4.4 Hgb 13.9 Plt 214   2005 Cath  FINDINGS:  1. LV: 136/10/19. EF 65% without regional wall motion abnormal.  2. No aortic stenosis or mitral regurgitation.  3. Left main: Angiographically normal.  4. LAD: Moderate-sized vessel giving rise to three diagonal branches.  First two diagonals are quite small and the third is larger. The LAD  and its branches are normal.  5. Circumflex: Large, codominant vessel giving rise of two obtuse  marginals in the PDA. It is angiographically normal. 6. RCA: Moderate-  sized, codominant vessel. It is angiographically normal.  6. Abdominal aorta: Normal abdominal aorta. It gives rise to single renal  arteries bilaterally. Both of these are normal. 8. Right leg: Severe  common iliac stenosis with peak gradient of 40 mmHg and a mean gradient  of 15 mmHg at rest. The remainder of the common iliac as well as the  internal iliac, external iliac, common femoral, and proximal portions of  the SFA and profunda are normal.  7. Left leg: There is a mild stenosis of the common iliac artery  (approximately 20%). The remainder of the common as well as the   internal iliac, external iliac, common femoral and proximal portion of  the SFA is normal. There is an approximately 50% stenosis of the  proximal profunda.  IMPRESSION/RECOMMENDATIONS:  1. Angiographically normal coronary arteries with normal LV size and  systolic function. No aortic stenosis or mitral regurgitation. I  suspect a non-cardiac etiology to his chest pain. Despite these  negative findings, his atherosclerotic peripheral vascular disease  places him at high risk for subsequent coronary event. Thus, aggressive  secondary prevention efforts should be continued.  2. 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 Stenting is recommended.   01/21/14 Lexiscan Nuclear Stress Test  Tomographic views were obtained using the short axis, vertical long axis, and horizontal long axis planes. There was a moderate-sized, dense, inferior defect from apical to mid level, partial reversibility noted from mid to basal level. This is most suggestive of inferior scar with peri-infarct ischemia of mild degree in the mid to basal segment.  Gated imaging revealed an EDV of 82, ESV of 31, TID ratio 0.9, and LVEF of 63% without obvious wall motion abnormalities.  IMPRESSION: Abnormal, but relatively low risk Lexiscan Cardiolite as outlined. Patient was not able to ambulate on the treadmill to achieve adequate heart rate response due to shortness of breath and probable claudication. There were no diagnostic ST segment changes with Lexiscan. Perfusion imaging is most consistent with inferior wall scar and mild peri-infarct ischemia in the mid to basal segment. There is however normal wall motion in this region on gated imaging with LVEF 63% and normal LV volumes. Suggests RCA distribution ischemia.  01/2014 ABI  Right 1.1 Left 1.1   2/15 Echo  LVEF 55-60%, grade I diastolic dysfunction, technically difficult study      Assessment and  Plan  1. Chest pain  -  MPI last year was low risk, suggestion of inferior scar with mild peri-infarct ischemia, normal wall motion and LVEF  - no recent symptoms, will continue current meds. If return or progression of chest pain would consider cath.   2. Leg pain/PAD  - normal ABIs recently, MRI with evidence of lumbar disease that is likely etiology. Continue to follow with pcp  3. HTN  - above goal basedo n his DM2 history, he will keep bp log and  submit in 2 weeks.   4. Hyperlipidemia  - unable to tolerate statins including most recently low dose pravastatin, continue zetia.    F/u 4 months    Antoine Poche, M.D.

## 2015-10-11 NOTE — Patient Instructions (Signed)
Your physician recommends that you schedule a follow-up appointment in: 4 months with Dr. Harl Bowie  Your physician recommends that you continue on your current medications as directed. Please refer to the Current Medication list given to you today.  Your physician has requested that you regularly monitor and record your blood pressure readings at home FOR 2 Gage. Please use the same machine at the same time of day to check your readings and record them to bring to your follow-up visit.  Thank you for choosing [facility]!!

## 2015-10-14 ENCOUNTER — Other Ambulatory Visit (HOSPITAL_COMMUNITY): Payer: Self-pay | Admitting: Orthopaedic Surgery

## 2015-10-14 DIAGNOSIS — M545 Low back pain: Secondary | ICD-10-CM

## 2015-10-14 DIAGNOSIS — R202 Paresthesia of skin: Secondary | ICD-10-CM

## 2015-10-19 ENCOUNTER — Other Ambulatory Visit: Payer: Self-pay | Admitting: Family Medicine

## 2015-10-28 ENCOUNTER — Encounter: Payer: Self-pay | Admitting: *Deleted

## 2015-10-29 ENCOUNTER — Ambulatory Visit (HOSPITAL_COMMUNITY)
Admission: RE | Admit: 2015-10-29 | Discharge: 2015-10-29 | Disposition: A | Discharge status: Home / Self Care | Payer: Medicaid Other | Source: Ambulatory Visit | Attending: Orthopaedic Surgery | Admitting: Orthopaedic Surgery

## 2015-10-29 DIAGNOSIS — M1288 Other specific arthropathies, not elsewhere classified, other specified site: Secondary | ICD-10-CM | POA: Insufficient documentation

## 2015-10-29 DIAGNOSIS — M545 Low back pain: Secondary | ICD-10-CM | POA: Diagnosis present

## 2015-10-29 DIAGNOSIS — R202 Paresthesia of skin: Secondary | ICD-10-CM

## 2015-10-29 DIAGNOSIS — M5126 Other intervertebral disc displacement, lumbar region: Secondary | ICD-10-CM | POA: Diagnosis not present

## 2015-12-09 ENCOUNTER — Other Ambulatory Visit: Payer: Self-pay | Admitting: Family Medicine

## 2015-12-14 ENCOUNTER — Encounter: Payer: Self-pay | Admitting: Family Medicine

## 2015-12-14 ENCOUNTER — Ambulatory Visit (INDEPENDENT_AMBULATORY_CARE_PROVIDER_SITE_OTHER): Payer: Medicaid Other | Admitting: Family Medicine

## 2015-12-14 VITALS — BP 136/76 | HR 68 | Resp 18 | Ht 67.0 in | Wt 213.0 lb

## 2015-12-14 DIAGNOSIS — M5432 Sciatica, left side: Secondary | ICD-10-CM | POA: Diagnosis not present

## 2015-12-14 DIAGNOSIS — E1169 Type 2 diabetes mellitus with other specified complication: Secondary | ICD-10-CM

## 2015-12-14 DIAGNOSIS — E669 Obesity, unspecified: Secondary | ICD-10-CM

## 2015-12-14 DIAGNOSIS — Z23 Encounter for immunization: Secondary | ICD-10-CM

## 2015-12-14 DIAGNOSIS — I10 Essential (primary) hypertension: Secondary | ICD-10-CM | POA: Diagnosis not present

## 2015-12-14 DIAGNOSIS — J3089 Other allergic rhinitis: Secondary | ICD-10-CM | POA: Diagnosis not present

## 2015-12-14 DIAGNOSIS — J309 Allergic rhinitis, unspecified: Secondary | ICD-10-CM | POA: Insufficient documentation

## 2015-12-14 DIAGNOSIS — E119 Type 2 diabetes mellitus without complications: Secondary | ICD-10-CM

## 2015-12-14 DIAGNOSIS — M5410 Radiculopathy, site unspecified: Secondary | ICD-10-CM | POA: Insufficient documentation

## 2015-12-14 DIAGNOSIS — E785 Hyperlipidemia, unspecified: Secondary | ICD-10-CM | POA: Diagnosis not present

## 2015-12-14 LAB — COMPLETE METABOLIC PANEL WITH GFR
ALT: 104 U/L — ABNORMAL HIGH (ref 9–46)
AST: 35 U/L (ref 10–35)
Albumin: 3.9 g/dL (ref 3.6–5.1)
Alkaline Phosphatase: 71 U/L (ref 40–115)
BUN: 17 mg/dL (ref 7–25)
CHLORIDE: 99 mmol/L (ref 98–110)
CO2: 25 mmol/L (ref 20–31)
Calcium: 9.8 mg/dL (ref 8.6–10.3)
Creat: 0.88 mg/dL (ref 0.70–1.25)
GFR, Est African American: >89 mL/min (ref >=60)
GLUCOSE: 217 mg/dL — AB (ref 65–99)
POTASSIUM: 4.7 mmol/L (ref 3.5–5.3)
SODIUM: 134 mmol/L — AB (ref 135–146)
Total Bilirubin: 0.6 mg/dL (ref 0.2–1.2)
Total Protein: 7.3 g/dL (ref 6.1–8.1)

## 2015-12-14 LAB — LIPID PANEL
CHOLESTEROL: 219 mg/dL — AB (ref 125–200)
HDL: 34 mg/dL — AB (ref >=40)
LDL CALC: 139 mg/dL — AB (ref <130)
TRIGLYCERIDES: 229 mg/dL — AB (ref <150)
Total CHOL/HDL Ratio: 6.4 Ratio — ABNORMAL HIGH (ref <=5.0)
VLDL: 46 mg/dL — AB (ref <30)

## 2015-12-14 LAB — HEMOGLOBIN A1C
HEMOGLOBIN A1C: 7.7 % — AB (ref <5.7)
Mean Plasma Glucose: 174 mg/dL — ABNORMAL HIGH (ref <117)

## 2015-12-14 LAB — TSH: TSH: 1.468 u[IU]/mL (ref 0.350–4.500)

## 2015-12-14 MED ORDER — MONTELUKAST SODIUM 10 MG PO TABS: 10.0000 mg | ORAL_TABLET | Freq: Every day | ORAL | Status: DC

## 2015-12-14 MED ORDER — METHYLPREDNISOLONE ACETATE 80 MG/ML IJ SUSP
80.0000 mg | Freq: Once | INTRAMUSCULAR | Status: AC
  Administered 2015-12-14: 80 mg via INTRAMUSCULAR

## 2015-12-14 MED ORDER — PREDNISONE 5 MG PO TABS: 5.0000 mg | ORAL_TABLET | Freq: Two times a day (BID) | ORAL | Status: AC

## 2015-12-14 MED ORDER — AZELASTINE HCL 0.1 % NA SOLN: 2.0000 | Freq: Two times a day (BID) | NASAL | Status: DC

## 2015-12-14 MED ORDER — KETOROLAC TROMETHAMINE 60 MG/2ML IM SOLN
60.0000 mg | Freq: Once | INTRAMUSCULAR | Status: AC
  Administered 2015-12-14: 60 mg via INTRAMUSCULAR

## 2015-12-14 NOTE — Patient Instructions (Signed)
Physical exam in 4 month, call if you need me sooner  Injections in office today for back pain and uncontrolled allergies, toradol and depo medrol  Prednisone sent for 5 days, also new is daily astelin and singulair for allergies  Flu vaccine and foot exam today  Expect a call on labs pleae in am!  Thanks for choosing [facility], we consider it a privelige to serve you.   All the best for 2017!

## 2015-12-14 NOTE — Assessment & Plan Note (Signed)
Uncontrolled x 2 weeks , start daily medication

## 2015-12-14 NOTE — Assessment & Plan Note (Signed)
Controlled, no change in medication DASH diet and commitment to daily physical activity for a minimum of 30 minutes discussed and encouraged, as a part of hypertension management. The importance of attaining a healthy weight is also discussed.  BP/Weight 12/14/2015 10/29/2015 10/11/2015 06/08/2015 02/25/2015 01/27/2015 [id number]  Systolic BP [id number] - 123456 0000000 123456 [id number]  Diastolic BP 76 - 72 70 80 80 67  Wt. (Lbs) 213 212 212 215 215 215.08 216  BMI 33.35 33.2 33.2 33.67 33.67 33.68 33.82

## 2015-12-14 NOTE — Assessment & Plan Note (Signed)
Deteriorated. Patient re-educated about  the importance of commitment to a  minimum of 150 minutes of exercise per week.  The importance of healthy food choices with portion control discussed. Encouraged to start a food diary, count calories and to consider  joining a support group. Sample diet sheets offered. Goals set by the patient for the next several months.   Weight /BMI 12/14/2015 10/29/2015 10/11/2015  WEIGHT 213 lb 212 lb 212 lb  HEIGHT 5\' 7"  5\' 7"  5\' 7"   BMI 33.35 kg/m2 33.2 kg/m2 33.2 kg/m2    Current exercise per week90 minutes.Limited by cheronic back and lower extremity pain

## 2015-12-14 NOTE — Progress Notes (Signed)
Subjective:    Patient ID: Mark Walls, male    DOB: Mar 27, 1956, 59 y.o.   MRN: [id number]  HPI   Mark Walls     MRN: [id number]      DOB: 1956-06-18   HPI Mr. Kollman is here for follow up and re-evaluation of chronic medical conditions, medication management and review of any available recent lab and radiology data.  Preventive health is updated, specifically  Cancer screening and Immunization.   Questions or concerns regarding consultations or procedures which the PT has had in the interim are  addressed. The PT denies any adverse reactions to current medications since the last visit.  Increased and uncontrolled back and lower ext pain, epidural of no benefit  Blood sugar often over 150  Increased allergy symptoms , no fever or chillsThere are no specific complaints   ROS  Denies chest congestion, productive cough or wheezing. Denies chest pains, palpitations and leg swelling Denies abdominal pain, nausea, vomiting,diarrhea or constipation.   Denies dysuria, frequency, hesitancy or incontinence.  Denies headaches, seizures, numbness, or tingling. Denies depression, anxiety or insomnia. Denies skin break down or rash.   PE  BP 136/76 mmHg  Pulse 68  Resp 18  Ht 5\' 7"  (1.702 m)  Wt 213 lb (96.616 kg)  BMI 33.35 kg/m2  SpO2 96%  Patient alert and oriented and in no cardiopulmonary distress.  HEENT: No facial asymmetry, EOMI,   oropharynx pink and moist.  Neck supple no JVD, no mass.  Chest: Clear to auscultation bilaterally.  CVS: S1, S2 no murmurs, no S3.Regular rate.  ABD: Soft non tender.   Ext: No edema  NW:[id number]  ROM spine, adequate in shoulders, hips and knees.  Skin: Intact, no ulcerations or rash noted.  Psych: Good eye contact, normal affect. Memory intact not anxious or depressed appearing.  CNS: CN 2-12 intact, power,  normal throughout.no focal deficits noted.   Assessment & Plan   Back pain with left-sided  sciatica Uncontrolled.Toradol and depo medrol administered IM in the office , to be followed by a short course of oral prednisone and NSAIDS.   Allergic rhinitis Uncontrolled x 2 weeks , start daily medication  Essential hypertension Controlled, no change in medication DASH diet and commitment to daily physical activity for a minimum of 30 minutes discussed and encouraged, as a part of hypertension management. The importance of attaining a healthy weight is also discussed.  BP/Weight 12/14/2015 10/29/2015 10/11/2015 06/08/2015 02/25/2015 01/27/2015 11/04/2014  Systolic BP 136 - 144 138 144 657 145  Diastolic BP 76 - 72 70 80 80 67  Wt. (Lbs) 213 212 212 215 215 215.08 216  BMI 33.35 33.2 33.2 33.67 33.67 33.68 33.82        Hyperlipidemia LDL goal <100 Hyperlipidemia:Low fat diet discussed and encouraged.   Lipid Panel  Lab Results  Component Value Date   CHOL 212* 01/27/2015   HDL 33* 01/27/2015   LDLCALC 128* 01/27/2015   TRIG 256* 01/27/2015   CHOLHDL 6.4 01/27/2015      Updated lab today will review and addresss when available.   Obesity (BMI 30.0-34.9) Deteriorated. Patient re-educated about  the importance of commitment to a  minimum of 150 minutes of exercise per week.  The importance of healthy food choices with portion control discussed. Encouraged to start a food diary, count calories and to consider  joining a support group. Sample diet sheets offered. Goals set by the patient for the next several months.   Weight /  BMI 12/14/2015 10/29/2015 10/11/2015  WEIGHT 213 lb 212 lb 212 lb  HEIGHT 5\' 7"  5\' 7"  5\' 7"   BMI 33.35 kg/m2 33.2 kg/m2 33.2 kg/m2    Current exercise per week90 minutes.Limited by cheronic back and lower extremity pain   Diabetes mellitus type 2 in obese (HCC) Reports fasting sugars of 150 and above and blood sugars as high as 300, apparently uncontrolled , will f/u lab and adjust med as needed      Review of Systems      Objective:   Physical Exam        Assessment & Plan:

## 2015-12-14 NOTE — Assessment & Plan Note (Signed)
Hyperlipidemia:Low fat diet discussed and encouraged.   Lipid Panel  Lab Results  Component Value Date   CHOL 212* 01/27/2015   HDL 33* 01/27/2015   LDLCALC 128* 01/27/2015   TRIG 256* 01/27/2015   CHOLHDL 6.4 01/27/2015      Updated lab today will review and addresss when available.

## 2015-12-14 NOTE — Assessment & Plan Note (Signed)
Reports fasting sugars of 150 and above and blood sugars as high as 300, apparently uncontrolled , will f/u lab and adjust med as needed

## 2015-12-14 NOTE — Assessment & Plan Note (Signed)
Uncontrolled.Toradol and depo medrol administered IM in the office , to be followed by a short course of oral prednisone and NSAIDS.  

## 2015-12-15 LAB — HIV ANTIBODY (ROUTINE TESTING W REFLEX): HIV: NONREACTIVE

## 2015-12-19 ENCOUNTER — Other Ambulatory Visit: Payer: Self-pay | Admitting: Family Medicine

## 2015-12-27 ENCOUNTER — Other Ambulatory Visit: Payer: Self-pay

## 2015-12-27 ENCOUNTER — Telehealth: Payer: Self-pay

## 2015-12-27 DIAGNOSIS — E785 Hyperlipidemia, unspecified: Secondary | ICD-10-CM

## 2015-12-27 DIAGNOSIS — E669 Obesity, unspecified: Principal | ICD-10-CM

## 2015-12-27 DIAGNOSIS — E1169 Type 2 diabetes mellitus with other specified complication: Secondary | ICD-10-CM

## 2015-12-27 MED ORDER — GLIPIZIDE ER 10 MG PO TB24: 10.0000 mg | ORAL_TABLET | Freq: Every day | ORAL | Status: DC

## 2015-12-27 NOTE — Telephone Encounter (Signed)
Repeat labs ordered and medication change faxed to pharmacy.

## 2016-01-04 ENCOUNTER — Other Ambulatory Visit: Payer: Self-pay | Admitting: Family Medicine

## 2016-01-10 ENCOUNTER — Telehealth: Payer: Self-pay | Admitting: Family Medicine

## 2016-01-10 MED ORDER — ACCU-CHEK AVIVA PLUS W/DEVICE KIT: PACK | Status: DC

## 2016-01-10 NOTE — Telephone Encounter (Signed)
Called into the pharmacy.

## 2016-01-10 NOTE — Telephone Encounter (Signed)
Mark Walls is calling stating that his "Sugar Machine" has broken and he needs a new one called in to the Pharmacy, please advise?

## 2016-01-19 ENCOUNTER — Other Ambulatory Visit: Payer: Self-pay | Admitting: Family Medicine

## 2016-01-27 ENCOUNTER — Telehealth: Payer: Self-pay | Admitting: Orthopaedic Surgery

## 2016-01-27 MED ORDER — OXYCODONE-ACETAMINOPHEN 5-325 MG PO TABS: 1.0000 | ORAL_TABLET | ORAL | Status: DC | PRN

## 2016-01-27 NOTE — Telephone Encounter (Signed)
Prescription available, called patient, no answer 

## 2016-01-27 NOTE — Telephone Encounter (Signed)
Patient requests refill on Percocet 5 mg.-325 mg. Qty 120  Last filled 01-04-16

## 2016-01-27 NOTE — Telephone Encounter (Signed)
Rx printed

## 2016-02-01 ENCOUNTER — Ambulatory Visit (INDEPENDENT_AMBULATORY_CARE_PROVIDER_SITE_OTHER): Payer: Medicaid Other | Admitting: Orthopaedic Surgery

## 2016-02-01 VITALS — BP 175/62 | HR 70 | Ht 67.0 in | Wt 215.2 lb

## 2016-02-01 DIAGNOSIS — M545 Low back pain, unspecified: Secondary | ICD-10-CM

## 2016-02-01 NOTE — Patient Instructions (Addendum)
Continue medicine and exercises.6

## 2016-02-01 NOTE — Progress Notes (Signed)
Patient ID:Mark Walls, male DOB:01/09/1956, 60 y.o. ZOX:[id number]  Chief Complaint  Patient presents with  . Follow-up    back pain worse    HPI  Mark Walls is a 60 y.o. male who has chronic lower back pain with no paresthesias.  He has no new trauma.  He has been doing his exercises and taking his medicine which helps.  He has more pain with prolonged standing or activity and with cold weather changes.  He has no new acute pain.  HPI  Body mass index is 33.7 kg/(m^2).  Review of Systems  Patient has Diabetes Mellitus. Patient has hypertension. Patient does not have COPD or shortness of breath. Patient does not have BMI > 35. Patient has current smoking history.  Review of Systems  Constitutional: Positive for fatigue.  Endocrine: Positive for polyuria.  Musculoskeletal: Positive for myalgias, back pain and arthralgias.  Psychiatric/Behavioral: Positive for sleep disturbance. The patient is nervous/anxious.     Past Medical History  Diagnosis Date  . PVD (peripheral vascular disease) (HCC)   . ED (erectile dysfunction)   . Anxiety   . Depression   . Obesity   . Chronic fatigue   . Nicotine addiction   . Diabetes mellitus, type 2 (HCC)   . Hyperlipidemia   . Hypertension     Past Surgical History  Procedure Laterality Date  . Cholecystectomy    . Polpectomy colon    . Esophagogastroduodenoscopy  09/24/2002    [name]:gastritis of the antrum and the bodt of the stomach otherwise normal  . Left knee athroscopy  03/2008  . Rotator cuff repair  09/01/08  . Right elbow    . Left hand    . Colonoscopy  10/01/2002    [name]:small internal hemorrhoids/single small sessile polyp in the rectum/ otherwise normal. Hyperplastic polyp.  . Right common iliac artery stenting    . Colonoscopy with esophagogastroduodenoscopy (egd) N/A 08/19/2013    [name] EG junction. Hiatal hernia. Abnormal gastric mucosa s/p bx: Internal hemorrhoids;  Otherwise, normal ileocolonoscopy -  Status post segmental biopsy    Family History  Problem Relation Age of Onset  . Cirrhosis Mother   . Melanoma Mother   . Mental illness Brother   . Mental illness Father     suicide   . Melanoma Brother   . Mental illness Brother   . Diabetes Brother   . Hypertension Brother   . Diabetes Brother   . Hypertension Brother   . Colon cancer Paternal Grandfather     age 79    Social History Social History  Substance Use Topics  . Smoking status: Former Smoker -- 1.00 packs/day for 40 years    Types: Cigarettes    Start date: 09/16/1970    Quit date: 11/17/2013  . Smokeless tobacco: Never Used  . Alcohol Use: No    Allergies  Allergen Reactions  . Allopurinol Nausea And Vomiting  . Levofloxacin Nausea And Vomiting  . Statins Other (See Comments)    Fatty liver, markedly elevated LFT  . Sulfamethoxazole-Trimethoprim Nausea And Vomiting  . Codeine Rash    Current Outpatient Prescriptions  Medication Sig Dispense Refill  . ACCU-CHEK AVIVA PLUS test strip TEST DAILY 50 each 1  . albuterol (PROVENTIL HFA;VENTOLIN HFA) 108 (90 BASE) MCG/ACT inhaler Inhale 2 puffs into the lungs every 6 (six) hours as needed for wheezing or shortness of breath. 1 Inhaler 2  . ALPRAZolam (XANAX) 1 MG tablet Take 1 mg by mouth 4 (four) times  daily as needed for anxiety.    Marland Kitchen amphetamine-dextroamphetamine (ADDERALL) 10 MG tablet Take 10 mg by mouth 3 (three) times daily.     Marland Kitchen aspirin 81 MG tablet Take 81 mg by mouth daily.    Marland Kitchen azelastine (ASTELIN) 0.1 % nasal spray Place 2 sprays into both nostrils 2 (two) times daily. Use in each nostril as directed 30 mL 12  . Blood Glucose Monitoring Suppl (ACCU-CHEK AVIVA PLUS) w/Device KIT 1 kit for daily testing dx e11.9 1 kit 0  . gabapentin (NEURONTIN) 300 MG capsule Take 1 capsule (300 mg total) by mouth 2 (two) times daily. 60 capsule 3  . glipiZIDE (GLUCOTROL XL) 10 MG 24 hr tablet Take 1 tablet (10 mg total) by mouth daily with breakfast. 30 tablet 2   . losartan (COZAAR) 50 MG tablet Take 1 tablet (50 mg total) by mouth daily. 30 tablet 4  . metoprolol tartrate (LOPRESSOR) 25 MG tablet Take 0.5 tablets (12.5 mg total) by mouth 2 (two) times daily. 90 tablet 3  . montelukast (SINGULAIR) 10 MG tablet Take 1 tablet (10 mg total) by mouth at bedtime. 30 tablet 3  . oxyCODONE-acetaminophen (PERCOCET/ROXICET) 5-325 MG tablet Take 1 tablet by mouth every 4 (four) hours as needed for moderate pain or severe pain (Must last 30 days.  Do not drive or operate machinery while taking this medicine). 120 tablet 0  . sitaGLIPtin-metformin (JANUMET) 50-1000 MG per tablet Take 1 tablet by mouth 2 (two) times daily with a meal.    . SYMBICORT 80-4.5 MCG/ACT inhaler INHALE 2 PUFFS TWICE DAILY. 10.2 g 2  . vardenafil (LEVITRA) 20 MG tablet Take 20 mg by mouth daily as needed.      Marland Kitchen ZETIA 10 MG tablet TAKE (1) TABLET BY MOUTH ONCE DAILY. 30 tablet 3   No current facility-administered medications for this visit.     Physical Exam  Blood pressure 175/62, pulse 70, height 5\' 7"  (1.702 m), weight 215 lb 3.2 oz (97.614 kg).  Constitutional: overall normal hygiene, normal nutrition, well developed, normal grooming, normal body habitus. Assistive device:none  Musculoskeletal: gait and station Limp none, muscle tone and strength are normal, no tremors or atrophy is present.  .  Neurological: coordination overall normal.  Deep tendon reflex/nerve stretch intact.  Sensation normal.  Cranial nerves II-XII intact.   Skin:normal overall no scars, lesions, ulcers or rash es. No psoriasis.  Psychiatric: Alert and oriented x 3.  Recent memory intact, remote memory unclear.  Normal mood and affect. Well groomed.  Good eye contact.  Cardiovascular: overall no swelling, no varicosities, no edema bilaterally, normal temperatures of the legs and arms, no clubbing, cyanosis and good capillary refill.  Lymphatic: palpation is normal.   Extremities:His back is tender but  he has no spasm.  He has no paresthesias. Inspection tender lower back Strength and tone normal Range of motion flexion 30, extension 5, lateral bend normal bilaterally, SLR negative.    PLAN Call if any problems.  Precautions discussed.  Continue current medications.   Return to clinic 6 weeks.

## 2016-02-11 ENCOUNTER — Encounter: Payer: Self-pay | Admitting: Cardiology

## 2016-02-11 ENCOUNTER — Ambulatory Visit (INDEPENDENT_AMBULATORY_CARE_PROVIDER_SITE_OTHER): Payer: Medicaid Other | Admitting: Cardiology

## 2016-02-11 VITALS — BP 161/74 | HR 67 | Ht 67.0 in | Wt 215.2 lb

## 2016-02-11 DIAGNOSIS — E785 Hyperlipidemia, unspecified: Secondary | ICD-10-CM | POA: Diagnosis not present

## 2016-02-11 DIAGNOSIS — R0789 Other chest pain: Secondary | ICD-10-CM | POA: Diagnosis not present

## 2016-02-11 DIAGNOSIS — I739 Peripheral vascular disease, unspecified: Secondary | ICD-10-CM | POA: Diagnosis not present

## 2016-02-11 DIAGNOSIS — I10 Essential (primary) hypertension: Secondary | ICD-10-CM

## 2016-02-11 NOTE — Progress Notes (Signed)
Patient ID: Mark Walls, male   DOB: Apr 16, 1956, 60 y.o.   MRN: [id number]     Clinical Summary Mark Walls is a 60 y.o.male seen today for follow up of the following medical problems.   1. Chest pain  - long history of chest pain described last visit, mixed in characteristics for cardiac chest pain -01/2014 Lexiscan MPI, moderate sized inferior defect with mild reversibility, LVEF 63%, overall low risk. This has been managed medically thus far  - episode of chest pain that was constant x 7 days, now resolved.  - compliant with ends  2. Hyperlipidemia  - elevated LFTs, fatty liver per PCP notes. Statins also cause some fatigue. Currently on zetia 10mg  daily.  - 01/2015 TC 212 TG 256 HDL 33 LDL 128 - compliant with zetia  3. HTN   - checks at home daily. Typically 160s/80s.   4. PAD  - prior stent placed 2005, right common iliac  - has had interrmittent nonclaudication like leg pain. Normal ABIs 01/2014 - MRI has shown lumbar disease and foraminal impingement, potential etiology  5. COPD  - poor compliance with inhalers.  - +wheezing at times. Better with inhaler.  Past Medical History  Diagnosis Date  . PVD (peripheral vascular disease) (HCC)   . ED (erectile dysfunction)   . Anxiety   . Depression   . Obesity   . Chronic fatigue   . Nicotine addiction   . Diabetes mellitus, type 2 (HCC)   . Hyperlipidemia   . Hypertension      Allergies  Allergen Reactions  . Allopurinol Nausea And Vomiting  . Levofloxacin Nausea And Vomiting  . Statins Other (See Comments)    Fatty liver, markedly elevated LFT  . Sulfamethoxazole-Trimethoprim Nausea And Vomiting  . Codeine Rash     Current Outpatient Prescriptions  Medication Sig Dispense Refill  . ACCU-CHEK AVIVA PLUS test strip TEST DAILY 50 each 1  . albuterol (PROVENTIL HFA;VENTOLIN HFA) 108 (90 BASE) MCG/ACT inhaler Inhale 2 puffs into the lungs every 6 (six) hours as needed for wheezing or shortness of  breath. 1 Inhaler 2  . ALPRAZolam (XANAX) 1 MG tablet Take 1 mg by mouth 4 (four) times daily as needed for anxiety.    Marland Kitchen amphetamine-dextroamphetamine (ADDERALL) 10 MG tablet Take 10 mg by mouth 3 (three) times daily.     Marland Kitchen aspirin 81 MG tablet Take 81 mg by mouth daily.    Marland Kitchen azelastine (ASTELIN) 0.1 % nasal spray Place 2 sprays into both nostrils 2 (two) times daily. Use in each nostril as directed 30 mL 12  . Blood Glucose Monitoring Suppl (ACCU-CHEK AVIVA PLUS) w/Device KIT 1 kit for daily testing dx e11.9 1 kit 0  . gabapentin (NEURONTIN) 300 MG capsule Take 1 capsule (300 mg total) by mouth 2 (two) times daily. 60 capsule 3  . glipiZIDE (GLUCOTROL XL) 10 MG 24 hr tablet Take 1 tablet (10 mg total) by mouth daily with breakfast. 30 tablet 2  . losartan (COZAAR) 50 MG tablet Take 1 tablet (50 mg total) by mouth daily. 30 tablet 4  . metoprolol tartrate (LOPRESSOR) 25 MG tablet Take 0.5 tablets (12.5 mg total) by mouth 2 (two) times daily. 90 tablet 3  . montelukast (SINGULAIR) 10 MG tablet Take 1 tablet (10 mg total) by mouth at bedtime. 30 tablet 3  . oxyCODONE-acetaminophen (PERCOCET/ROXICET) 5-325 MG tablet Take 1 tablet by mouth every 4 (four) hours as needed for moderate pain or severe pain (Must last  30 days.  Do not drive or operate machinery while taking this medicine). 120 tablet 0  . sitaGLIPtin-metformin (JANUMET) 50-1000 MG per tablet Take 1 tablet by mouth 2 (two) times daily with a meal.    . SYMBICORT 80-4.5 MCG/ACT inhaler INHALE 2 PUFFS TWICE DAILY. 10.2 g 2  . vardenafil (LEVITRA) 20 MG tablet Take 20 mg by mouth daily as needed.      Marland Kitchen ZETIA 10 MG tablet TAKE (1) TABLET BY MOUTH ONCE DAILY. 30 tablet 3   No current facility-administered medications for this visit.     Past Surgical History  Procedure Laterality Date  . Cholecystectomy    . Polpectomy colon    . Esophagogastroduodenoscopy  09/24/2002    [name]:gastritis of the antrum and the bodt of the stomach  otherwise normal  . Left knee athroscopy  03/2008  . Rotator cuff repair  09/01/08  . Right elbow    . Left hand    . Colonoscopy  10/01/2002    [name]:small internal hemorrhoids/single small sessile polyp in the rectum/ otherwise normal. Hyperplastic polyp.  . Right common iliac artery stenting    . Colonoscopy with esophagogastroduodenoscopy (egd) N/A 08/19/2013    [id number] EG junction. Hiatal hernia. Abnormal gastric mucosa s/p bx: Internal hemorrhoids;  Otherwise, normal ileocolonoscopy - Status post segmental biopsy     Allergies  Allergen Reactions  . Allopurinol Nausea And Vomiting  . Levofloxacin Nausea And Vomiting  . Statins Other (See Comments)    Fatty liver, markedly elevated LFT  . Sulfamethoxazole-Trimethoprim Nausea And Vomiting  . Codeine Rash      Family History  Problem Relation Age of Onset  . Cirrhosis Mother   . Melanoma Mother   . Mental illness Brother   . Mental illness Father     suicide   . Melanoma Brother   . Mental illness Brother   . Diabetes Brother   . Hypertension Brother   . Diabetes Brother   . Hypertension Brother   . Colon cancer Paternal Grandfather     age 24     Social History Mark Walls reports that he quit smoking about 2 years ago. His smoking use included Cigarettes. He started smoking about 45 years ago. He has a 40 pack-year smoking history. He has never used smokeless tobacco. Mark Walls reports that he does not drink alcohol.   Review of Systems CONSTITUTIONAL: No weight loss, fever, chills, weakness or fatigue.  HEENT: Eyes: No visual loss, blurred vision, double vision or yellow sclerae.No hearing loss, sneezing, congestion, runny nose or sore throat.  SKIN: No rash or itching.  CARDIOVASCULAR: per hpi RESPIRATORY: No shortness of breath, cough or sputum.  GASTROINTESTINAL: No anorexia, nausea, vomiting or diarrhea. No abdominal pain or blood.  GENITOURINARY: No burning on urination, no  polyuria NEUROLOGICAL: No headache, dizziness, syncope, paralysis, ataxia, numbness or tingling in the extremities. No change in bowel or bladder control.  MUSCULOSKELETAL: +leg pains LYMPHATICS: No enlarged nodes. No history of splenectomy.  PSYCHIATRIC: No history of depression or anxiety.  ENDOCRINOLOGIC: No reports of sweating, cold or heat intolerance. No polyuria or polydipsia.  Marland Kitchen   Physical Examination Filed Vitals:   02/11/16 1120  BP: 161/74  Pulse: 67   Filed Vitals:   02/11/16 1120  Height: 5\' 7"  (1.702 m)  Weight: 215 lb 3.2 oz (97.614 kg)    Gen: resting comfortably, no acute distress HEENT: no scleral icterus, pupils equal round and reactive, no palptable cervical adenopathy,  CV: RRR,  no m/r/g, no jvd Resp: Clear to auscultation bilaterally GI: abdomen is soft, non-tender, non-distended, normal bowel sounds, no hepatosplenomegaly MSK: extremities are warm, no edema.  Skin: warm, no rash Neuro:  no focal deficits Psych: appropriate affect   Diagnostic Studies 11/2013 CXR  No abnormality   11/2013 Labs: Cr 0.8 K 4.4 Hgb 13.9 Plt 214   2005 Cath  FINDINGS:  1. LV: 136/10/19. EF 65% without regional wall motion abnormal.  2. No aortic stenosis or mitral regurgitation.  3. Left main: Angiographically normal.  4. LAD: Moderate-sized vessel giving rise to three diagonal branches.  First two diagonals are quite small and the third is larger. The LAD  and its branches are normal.  5. Circumflex: Large, codominant vessel giving rise of two obtuse  marginals in the PDA. It is angiographically normal. 6. RCA: Moderate-  sized, codominant vessel. It is angiographically normal.  6. Abdominal aorta: Normal abdominal aorta. It gives rise to single renal  arteries bilaterally. Both of these are normal. 8. Right leg: Severe  common iliac stenosis with peak gradient of 40 mmHg and a mean gradient  of 15 mmHg at rest. The remainder of the common iliac  as well as the  internal iliac, external iliac, common femoral, and proximal portions of  the SFA and profunda are normal.  7. Left leg: There is a mild stenosis of the common iliac artery  (approximately 20%). The remainder of the common as well as the  internal iliac, external iliac, common femoral and proximal portion of  the SFA is normal. There is an approximately 50% stenosis of the  proximal profunda.  IMPRESSION/RECOMMENDATIONS:  1. Angiographically normal coronary arteries with normal LV size and  systolic function. No aortic stenosis or mitral regurgitation. I  suspect a non-cardiac etiology to his chest pain. Despite these  negative findings, his atherosclerotic peripheral vascular disease  places him at high risk for subsequent coronary event. Thus, aggressive  secondary prevention efforts should be continued.  2. 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 Stenting is recommended.   01/21/14 Lexiscan Nuclear Stress Test  Tomographic views were obtained using the short axis, vertical long axis, and horizontal long axis planes. There was a moderate-sized, dense, inferior defect from apical to mid level, partial reversibility noted from mid to basal level. This is most suggestive of inferior scar with peri-infarct ischemia of mild degree in the mid to basal segment.  Gated imaging revealed an EDV of 82, ESV of 31, TID ratio 0.9, and LVEF of 63% without obvious wall motion abnormalities.  IMPRESSION: Abnormal, but relatively low risk Lexiscan Cardiolite as outlined. Patient was not able to ambulate on the treadmill to achieve adequate heart rate response due to shortness of breath and probable claudication. There were no diagnostic ST segment changes with Lexiscan. Perfusion imaging is most consistent with inferior wall scar and mild peri-infarct ischemia in the mid to basal segment. There is however normal wall motion  in this region on gated imaging with LVEF 63% and normal LV volumes. Suggests RCA distribution ischemia.  01/2014 ABI  Right 1.1 Left 1.1   2/15 Echo  LVEF 55-60%, grade I diastolic dysfunction, technically difficult study     Assessment and Plan   1. Chest pain  - previous stress MPI was low risk, suggestion of inferior scar with mild peri-infarct ischemia, normal wall motion and LVEF  - no recent symptoms of cardicac like chest pain, will continue current meds.   2.  Leg pain/PAD  - normal ABIs recently, MRI with evidence of lumbar disease that is likely etiology.  - management per pcp  3. HTN  - above goal based on his DM2 history, he will keep bp log and submit in 1week. If persistently elevated will titrate up meds  4. Hyperlipidemia  - unable to tolerate statins including most recently low dose pravastatin, we will continue zetia.    F/u 3 months  Antoine Poche, M.D.

## 2016-02-11 NOTE — Patient Instructions (Signed)
Your physician recommends that you schedule a follow-up appointment in: 3 months with Dr. Harl Bowie  Your physician recommends that you continue on your current medications as directed. Please refer to the Current Medication list given to you today.  Your physician has requested that you regularly monitor and record your blood pressure readings at home FOR 1 [place]. Please use the same machine at the same time of day to check your readings and record them to bring to your follow-up visit.  Thank you for choosing [facility]!!

## 2016-02-15 ENCOUNTER — Telehealth: Payer: Self-pay | Admitting: *Deleted

## 2016-02-15 NOTE — Telephone Encounter (Signed)
Patient's wife called requesting percocet 7.5 qty 120 to be refilled for the patient. Please advise [phone] or [phone]

## 2016-02-15 NOTE — Telephone Encounter (Signed)
Too early.  March 7th is the date.  30 day supply.  Please check patient's meds before asking me.  30 days is 30 days.  Period.

## 2016-02-16 NOTE — Telephone Encounter (Signed)
I tried to call patient to make him aware, no answer no voicemail

## 2016-02-17 ENCOUNTER — Telehealth: Payer: Self-pay | Admitting: Family Medicine

## 2016-02-17 MED ORDER — PROMETHAZINE-DM 6.25-15 MG/5ML PO SYRP: 5.0000 mL | ORAL_SOLUTION | Freq: Every evening | ORAL | Status: DC | PRN

## 2016-02-17 MED ORDER — FLUTICASONE PROPIONATE 50 MCG/ACT NA SUSP: 2.0000 | Freq: Every day | NASAL | Status: DC

## 2016-02-17 MED ORDER — PREDNISONE 5 MG (21) PO TBPK: ORAL_TABLET | ORAL | Status: DC

## 2016-02-17 NOTE — Telephone Encounter (Signed)
Patient states that he is taking all allergy medications.   Has productive cough.  No fever.  Is asking for abt for bronchitis.  Please advise.

## 2016-02-17 NOTE — Telephone Encounter (Signed)
Having bronchitis symptoms x's 3 days, denies any fever, asking for something to be called in to the pharmacy

## 2016-02-17 NOTE — Telephone Encounter (Signed)
Patient aware and medication sent to pharmacy  

## 2016-02-17 NOTE — Telephone Encounter (Signed)
pls add flonase/ nasonex or veramyst as 2nd nose spray , 2 puffs per nostril daily, and send prred 5 mg dose pack #21 tabs Phenergan DM 5cc at bedtime as needed for excess cough x 118 cc let him know. No abx needed. Call or appt if gets fever , chills and ill, this is all allergies

## 2016-02-17 NOTE — Addendum Note (Signed)
Addended by: Denman George B on: 02/17/2016 04:30 PM   Modules accepted: Orders

## 2016-02-22 ENCOUNTER — Telehealth: Payer: Self-pay | Admitting: Orthopaedic Surgery

## 2016-02-22 NOTE — Telephone Encounter (Signed)
Patient requesting refill of Oxycodone/ Acetaminophen 5/325mg   Qty  120 Tablets

## 2016-02-23 MED ORDER — OXYCODONE-ACETAMINOPHEN 5-325 MG PO TABS: 1.0000 | ORAL_TABLET | ORAL | Status: DC | PRN

## 2016-02-23 NOTE — Telephone Encounter (Signed)
Rx done. 

## 2016-03-10 ENCOUNTER — Other Ambulatory Visit: Payer: Self-pay | Admitting: Family Medicine

## 2016-03-14 ENCOUNTER — Ambulatory Visit: Payer: Medicaid Other | Admitting: Orthopaedic Surgery

## 2016-03-14 ENCOUNTER — Encounter: Payer: Self-pay | Admitting: Orthopaedic Surgery

## 2016-03-23 ENCOUNTER — Encounter: Payer: Self-pay | Admitting: Orthopaedic Surgery

## 2016-03-23 ENCOUNTER — Ambulatory Visit (INDEPENDENT_AMBULATORY_CARE_PROVIDER_SITE_OTHER): Payer: Medicaid Other | Admitting: Orthopaedic Surgery

## 2016-03-23 VITALS — BP 167/82 | HR 62 | Temp 97.0°F | Ht 67.0 in | Wt 210.6 lb

## 2016-03-23 DIAGNOSIS — M545 Low back pain, unspecified: Secondary | ICD-10-CM

## 2016-03-23 MED ORDER — OXYCODONE-ACETAMINOPHEN 5-325 MG PO TABS: 1.0000 | ORAL_TABLET | ORAL | Status: DC | PRN

## 2016-03-23 NOTE — Progress Notes (Signed)
Patient ID:Mark Walls, male DOB:08-21-1956, 60 y.o. WCB:[id number]  Chief Complaint  Patient presents with  . Back Pain    HPI  Mark Walls is a 60 y.o. male who has long standing chronic lower back pain.  He has no new trauma.  He is worse.  He has more pain usually after weather changes or with increased activity.  He has more and more bad days.  He stays in bed a lot now.  He uses a cane to get around.  I reviewed his MRI done 10/29/15.  He has chronic changes in his lower back.  He has had epidurals with little help.  He has been seen by neurosurgeons, neurologists and other back specialist and pain doctors with little help.  He has been told by other physicians he will have continued pain in the back.  He does not like to hear this but has accepted it.  I had a good long talk with him.  He is depressed but in no ways sucicidal.  He is tired of hurting.  He has adapted to it as best he can.  He has other health issues including diabetes oral controlled.  He is watching that closely.    He has hypertension and watches that well also.  He has problems with various foods he likes to eat that make his GERD worse.  We talked about that as well.  HPI  Body mass index is 32.98 kg/(m^2).   Review of Systems  Constitutional: Positive for fatigue.  HENT: Negative for congestion.   Respiratory: Negative for cough and shortness of breath.   Cardiovascular: Negative for chest pain and leg swelling.  Endocrine: Positive for cold intolerance and polyuria.  Musculoskeletal: Positive for myalgias, back pain and arthralgias.  Allergic/Immunologic: Positive for environmental allergies.  Psychiatric/Behavioral: Positive for sleep disturbance. The patient is nervous/anxious.     Past Medical History  Diagnosis Date  . PVD (peripheral vascular disease) (HCC)   . ED (erectile dysfunction)   . Anxiety   . Depression   . Obesity   . Chronic fatigue   . Nicotine addiction   . Diabetes  mellitus, type 2 (HCC)   . Hyperlipidemia   . Hypertension     Past Surgical History  Procedure Laterality Date  . Cholecystectomy    . Polpectomy colon    . Esophagogastroduodenoscopy  09/24/2002    [name]:gastritis of the antrum and the bodt of the stomach otherwise normal  . Left knee athroscopy  03/2008  . Rotator cuff repair  09/01/08  . Right elbow    . Left hand    . Colonoscopy  10/01/2002    [name]:small internal hemorrhoids/single small sessile polyp in the rectum/ otherwise normal. Hyperplastic polyp.  . Right common iliac artery stenting    . Colonoscopy with esophagogastroduodenoscopy (egd) N/A 08/19/2013    OHY:WVPXTGGY EG junction. Hiatal hernia. Abnormal gastric mucosa s/p bx: Internal hemorrhoids;  Otherwise, normal ileocolonoscopy - Status post segmental biopsy    Family History  Problem Relation Age of Onset  . Cirrhosis Mother   . Melanoma Mother   . Mental illness Brother   . Mental illness Father     suicide   . Melanoma Brother   . Mental illness Brother   . Diabetes Brother   . Hypertension Brother   . Diabetes Brother   . Hypertension Brother   . Colon cancer Paternal Grandfather     age 59    Social History Social History  Substance Use Topics  . Smoking status: Former Smoker -- 1.00 packs/day for 40 years    Types: Cigarettes    Start date: 09/16/1970    Quit date: 11/17/2013  . Smokeless tobacco: Never Used  . Alcohol Use: No    Allergies  Allergen Reactions  . Allopurinol Nausea And Vomiting  . Levofloxacin Nausea And Vomiting  . Statins Other (See Comments)    Fatty liver, markedly elevated LFT  . Sulfamethoxazole-Trimethoprim Nausea And Vomiting  . Codeine Rash    Current Outpatient Prescriptions  Medication Sig Dispense Refill  . ACCU-CHEK AVIVA PLUS test strip TEST DAILY 50 each 2  . albuterol (PROVENTIL HFA;VENTOLIN HFA) 108 (90 BASE) MCG/ACT inhaler Inhale 2 puffs into the lungs every 6 (six) hours as needed for wheezing or  shortness of breath. 1 Inhaler 2  . ALPRAZolam (XANAX) 1 MG tablet Take 1 mg by mouth 4 (four) times daily as needed for anxiety.    Marland Kitchen amphetamine-dextroamphetamine (ADDERALL) 10 MG tablet Take 10 mg by mouth 3 (three) times daily.     Marland Kitchen aspirin 81 MG tablet Take 81 mg by mouth daily.    Marland Kitchen azelastine (ASTELIN) 0.1 % nasal spray Place 2 sprays into both nostrils 2 (two) times daily. Use in each nostril as directed 30 mL 12  . Blood Glucose Monitoring Suppl (ACCU-CHEK AVIVA PLUS) w/Device KIT 1 kit for daily testing dx e11.9 1 kit 0  . fluticasone (FLONASE) 50 MCG/ACT nasal spray Place 2 sprays into both nostrils daily. 16 g 6  . gabapentin (NEURONTIN) 300 MG capsule Take 1 capsule (300 mg total) by mouth 2 (two) times daily. 60 capsule 3  . glipiZIDE (GLUCOTROL XL) 10 MG 24 hr tablet Take 1 tablet (10 mg total) by mouth daily with breakfast. 30 tablet 2  . losartan (COZAAR) 50 MG tablet Take 1 tablet (50 mg total) by mouth daily. 30 tablet 4  . metoprolol tartrate (LOPRESSOR) 25 MG tablet Take 0.5 tablets (12.5 mg total) by mouth 2 (two) times daily. 90 tablet 3  . montelukast (SINGULAIR) 10 MG tablet Take 1 tablet (10 mg total) by mouth at bedtime. 30 tablet 3  . oxyCODONE-acetaminophen (PERCOCET/ROXICET) 5-325 MG tablet Take 1 tablet by mouth every 4 (four) hours as needed for moderate pain or severe pain (Must last 30 days.  Do not drive or operate machinery while taking this medicine). 150 tablet 0  . predniSONE (STERAPRED UNI-PAK 21 TAB) 5 MG (21) TBPK tablet As directed on package 21 tablet 0  . promethazine-dextromethorphan (PROMETHAZINE-DM) 6.25-15 MG/5ML syrup Take 5 mLs by mouth at bedtime as needed for cough. 118 mL 0  . sitaGLIPtin-metformin (JANUMET) 50-1000 MG per tablet Take 1 tablet by mouth 2 (two) times daily with a meal.    . SYMBICORT 80-4.5 MCG/ACT inhaler INHALE 2 PUFFS TWICE DAILY. 10.2 g 2  . vardenafil (LEVITRA) 20 MG tablet Take 20 mg by mouth daily as needed.      Marland Kitchen ZETIA  10 MG tablet TAKE (1) TABLET BY MOUTH ONCE DAILY. 30 tablet 3   No current facility-administered medications for this visit.     Physical Exam  Blood pressure 167/82, pulse 62, temperature 97 F (36.1 C), temperature source Tympanic, height 5\' 7"  (1.702 m), weight 210 lb 9.6 oz (95.528 kg).  Constitutional: overall normal hygiene, normal nutrition, well developed, normal grooming, normal body habitus. Assistive device:cane  Musculoskeletal: gait and station Limp left, muscle tone and strength are normal, no tremors or atrophy is  present.  .  Neurological: coordination overall normal.  Deep tendon reflex/nerve stretch intact.  Sensation normal.  Cranial nerves II-XII intact.   Skin:   normal overall no scars, lesions, ulcers or rashes. No psoriasis.  Psychiatric: Alert and oriented x 3.  Recent memory intact, remote memory unclear.  Normal mood and affect. Well groomed.  Good eye contact.  Cardiovascular: overall no swelling, no varicosities, no edema bilaterally, normal temperatures of the legs and arms, no clubbing, cyanosis and good capillary refill.  Lymphatic: palpation is normal.  Spine/Pelvis examination:  Inspection:  Overall, sacoiliac joint benign and hips nontender; without crepitus or defects.   Thoracic spine inspection: Alignment normal without kyphosis present   Lumbar spine inspection:  Alignment  with normal lumbar lordosis, without scoliosis apparent.   Thoracic spine palpation:  without tenderness of spinal processes   Lumbar spine palpation: with tenderness of lumbar area; with tightness of lumbar muscles    Range of Motion:   Lumbar flexion, forward flexion is 25  with pain or tenderness    Lumbar extension is 5  with pain or tenderness   Left lateral bend is Normal  without pain or tenderness   Right lateral bend is Normal without pain or tenderness   Straight leg raising is Normal   Strength & tone: Normal   Stability overall normal stability  The  patient has been educated about the nature of the problem(s) and counseled on treatment options.  The patient appeared to understand what I have discussed and is in agreement with it.  Encounter Diagnosis  Name Primary?  . Midline low back pain without sciatica Yes    PLAN Call if any problems.  Precautions discussed.  Continue current medications.   Return to clinic 6 weeks   Continue home exercises as best as he can.

## 2016-03-31 ENCOUNTER — Encounter: Payer: Self-pay | Admitting: *Deleted

## 2016-04-19 ENCOUNTER — Ambulatory Visit (INDEPENDENT_AMBULATORY_CARE_PROVIDER_SITE_OTHER): Payer: Medicaid Other | Admitting: Family Medicine

## 2016-04-19 ENCOUNTER — Encounter: Payer: Self-pay | Admitting: Family Medicine

## 2016-04-19 VITALS — BP 144/84 | HR 78 | Resp 16 | Ht 67.0 in | Wt 211.0 lb

## 2016-04-19 DIAGNOSIS — H547 Unspecified visual loss: Secondary | ICD-10-CM

## 2016-04-19 DIAGNOSIS — E669 Obesity, unspecified: Secondary | ICD-10-CM

## 2016-04-19 DIAGNOSIS — E1169 Type 2 diabetes mellitus with other specified complication: Secondary | ICD-10-CM

## 2016-04-19 DIAGNOSIS — I10 Essential (primary) hypertension: Secondary | ICD-10-CM

## 2016-04-19 DIAGNOSIS — E785 Hyperlipidemia, unspecified: Secondary | ICD-10-CM | POA: Diagnosis not present

## 2016-04-19 DIAGNOSIS — E66811 Obesity, class 1: Secondary | ICD-10-CM

## 2016-04-19 DIAGNOSIS — Z125 Encounter for screening for malignant neoplasm of prostate: Secondary | ICD-10-CM

## 2016-04-19 DIAGNOSIS — M5410 Radiculopathy, site unspecified: Secondary | ICD-10-CM

## 2016-04-19 DIAGNOSIS — Z9119 Patient's noncompliance with other medical treatment and regimen: Secondary | ICD-10-CM

## 2016-04-19 DIAGNOSIS — E119 Type 2 diabetes mellitus without complications: Secondary | ICD-10-CM

## 2016-04-19 DIAGNOSIS — Z91199 Patient's noncompliance with other medical treatment and regimen due to unspecified reason: Secondary | ICD-10-CM

## 2016-04-19 MED ORDER — METHYLPREDNISOLONE ACETATE 80 MG/ML IJ SUSP
80.0000 mg | Freq: Once | INTRAMUSCULAR | Status: AC
  Administered 2016-04-19: 80 mg via INTRAMUSCULAR

## 2016-04-19 MED ORDER — KETOROLAC TROMETHAMINE 60 MG/2ML IM SOLN
60.0000 mg | Freq: Once | INTRAMUSCULAR | Status: AC
  Administered 2016-04-19: 60 mg via INTRAMUSCULAR

## 2016-04-19 MED ORDER — GABAPENTIN 300 MG PO CAPS: 300.0000 mg | ORAL_CAPSULE | Freq: Two times a day (BID) | ORAL | Status: DC

## 2016-04-19 NOTE — Assessment & Plan Note (Signed)
Toradol  In office and gabapentin re sent , pt has not been taking apparently

## 2016-04-19 NOTE — Progress Notes (Signed)
Subjective:    Patient ID: Mark Walls, male    DOB: [birth year], 60 y.o.   MRN: [id number]  HPI    Mark Walls     MRN: [id number]      DOB: 1956-06-26   HPI Mr. Mark Walls is here for follow up and re-evaluation of chronic medical conditions, medication management and review of any available recent lab and radiology data.  Preventive health is updated, specifically  Cancer screening and Immunization.   Questions or concerns regarding consultations or procedures which the PT has had in the interim are  addressed. The PT denies any adverse reactions to current medications since the last visit.  There are no new concerns.  C/o increased back pain, wants injection Denies polyuria, polydipsia, blurred vision , or hypoglycemic episodes. Not testing blood sugar Has again not brought meds to viisit, and labs are past due   ROS Denies recent fever or chills. Denies sinus pressure, nasal congestion, ear pain or sore throat. Denies chest congestion, productive cough or wheezing. Denies chest pains, palpitations and leg swelling Denies abdominal pain, nausea, vomiting,diarrhea or constipation.   Denies dysuria, frequency, hesitancy or incontinence. C/o joint pain, swelling and limitation in mobility. Denies headaches, seizures, numbness, or tingling. Denies depression, anxiety or insomnia. Denies skin break down or rash.   PE  BP 144/84 mmHg  Pulse 78  Resp 16  Ht 5\' 7"  (1.702 m)  Wt 211 lb (95.709 kg)  BMI 33.04 kg/m2  SpO2 96%  Patient alert and oriented and in no cardiopulmonary distress.  HEENT: No facial asymmetry, EOMI,   oropharynx pink and moist.  Neck supple no JVD, no mass.  Chest: Clear to auscultation bilaterally.  CVS: S1, S2 no murmurs, no S3.Regular rate.  ABD: Soft non tender.   Ext: No edema  MS: decreased ROM spine, shoulders, hips and knees.  Skin: Intact, no ulcerations or rash noted.  Psych: Good eye contact, normal affect. Memory intact not  anxious or depressed appearing.  CNS: CN 2-12 intact, power,  normal throughout.no focal deficits noted.   Assessment & Plan   Back pain with left-sided radiculopathy Toradol  In office and gabapentin re sent , pt has not been taking apparently  Essential hypertension Uncontrolled, needs med review He is to return in 3 days DASH diet and commitment to daily physical activity for a minimum of 30 minutes discussed and encouraged, as a part of hypertension management. The importance of attaining a healthy weight is also discussed.  BP/Weight 04/19/2016 03/23/2016 02/11/2016 02/01/2016 12/14/2015 10/29/2015 10/11/2015  Systolic BP 144 167 161 175 136 - 144  Diastolic BP 84 82 74 62 76 - 72  Wt. (Lbs) 211 210.6 215.2 215.2 213 212 212  BMI 33.04 32.98 33.7 33.7 33.35 33.2 33.2        Medically noncompliant omgoing problem compromising care, discussed this at visit again  Diabetes mellitus type 2 in obese [facility]) Updated lab needed at/ before next visit. Mr. Mark Walls is reminded of the importance of commitment to daily physical activity for 30 minutes or more, as able and the need to limit carbohydrate intake to 30 to 60 grams per meal to help with blood sugar control.   The need to take medication as prescribed, test blood sugar as directed, and to call between visits if there is a concern that blood sugar is uncontrolled is also discussed.   Mr. Mark Walls is reminded of the importance of daily foot exam, annual eye examination, and good  blood sugar, blood pressure and cholesterol control.  Diabetic Labs Latest Ref Rng 04/19/2016 12/14/2015 01/27/2015 10/28/2014 10/05/2014  HbA1c <5.7 % - 7.7(H) 7.6(H) - 8.1(H)  Microalbumin Not estab mg/dL 1.0 - - 0.6 -  Micro/Creat Ratio <30 mcg/mg creat 4 - - 3.4 -  Chol 125 - 200 mg/dL - 409(W) 119(J) - 478(G)  HDL >=40 mg/dL - 95(A) 21(H) - 08(M)  Calc LDL <130 mg/dL - 578(I) 696(E) - 952(W)  Triglycerides <150 mg/dL - 413(K) 440(N) - 027(O)    Creatinine 0.70 - 1.25 mg/dL - 5.36 6.44 - 0.34   BP/Weight 04/19/2016 03/23/2016 02/11/2016 02/01/2016 12/14/2015 10/29/2015 10/11/2015  Systolic BP 144 167 161 175 136 - 144  Diastolic BP 84 82 74 62 76 - 72  Wt. (Lbs) 211 210.6 215.2 215.2 213 212 212  BMI 33.04 32.98 33.7 33.7 33.35 33.2 33.2   Foot/eye exam completion dates 12/14/2015 10/28/2014  Foot Form Completion Done Done         Hyperlipidemia LDL goal <100 Updated lab needed at/ before next visit. Hyperlipidemia:Low fat diet discussed and encouraged.   Lipid Panel  Lab Results  Component Value Date   CHOL 219* 12/14/2015   HDL 34* 12/14/2015   LDLCALC 139* 12/14/2015   TRIG 229* 12/14/2015   CHOLHDL 6.4* 12/14/2015        Obesity (BMI 30.0-34.9) Deteriorated. Patient re-educated about  the importance of commitment to a  minimum of 150 minutes of exercise per week.  The importance of healthy food choices with portion control discussed. Encouraged to start a food diary, count calories and to consider  joining a support group. Sample diet sheets offered. Goals set by the patient for the next several months.   Weight /BMI 04/19/2016 03/23/2016 02/11/2016  WEIGHT 211 lb 210 lb 9.6 oz 215 lb 3.2 oz  HEIGHT 5\' 7"  5\' 7"  5\' 7"   BMI 33.04 kg/m2 32.98 kg/m2 33.7 kg/m2    Current exercise per week 60 minutes.       Review of Systems     Objective:   Physical Exam        Assessment & Plan:

## 2016-04-19 NOTE — Patient Instructions (Addendum)
Annual physical exam in 4 months, call if you need  Me sooner  Nurse vist 5/8 with ALL medications that you have pls  TODAY lipid, cmp and EGFR and hBa1C, and PSA   Microalb from office today   New for nerve pain, pls collect today and start is gabapentin one twice daily  You are referred to Dr Iona Hansen for diabetic eye exam and poor right eye vision  Toradol 60 mg IM in office today Vision screen today

## 2016-04-20 ENCOUNTER — Telehealth: Payer: Self-pay | Admitting: Orthopaedic Surgery

## 2016-04-20 LAB — MICROALBUMIN / CREATININE URINE RATIO
CREATININE, URINE: 226 mg/dL (ref 20–370)
Microalb Creat Ratio: 4 mcg/mg creat (ref <30)
Microalb, Ur: 1 mg/dL

## 2016-04-20 MED ORDER — OXYCODONE-ACETAMINOPHEN 5-325 MG PO TABS: 1.0000 | ORAL_TABLET | ORAL | Status: DC | PRN

## 2016-04-20 NOTE — Telephone Encounter (Signed)
Oxycodone-Acetaminophen 5/325mg   Qty 150 Tablets

## 2016-04-20 NOTE — Telephone Encounter (Signed)
Rx done. 

## 2016-05-01 NOTE — Assessment & Plan Note (Signed)
Uncontrolled, needs med review He is to return in 3 days DASH diet and commitment to daily physical activity for a minimum of 30 minutes discussed and encouraged, as a part of hypertension management. The importance of attaining a healthy weight is also discussed.  BP/Weight 04/19/2016 03/23/2016 02/11/2016 02/01/2016 12/14/2015 10/29/2015 [id number]  Systolic BP 123456 [id number] [id number]  Diastolic BP 84 82 74 62 76 - 72  Wt. (Lbs) 211 210.6 215.2 215.2 213 212 212  BMI 33.04 32.98 33.7 33.7 33.35 33.2 33.2

## 2016-05-01 NOTE — Assessment & Plan Note (Signed)
Updated lab needed at/ before next visit. Mr. Mark Walls is reminded of the importance of commitment to daily physical activity for 30 minutes or more, as able and the need to limit carbohydrate intake to 30 to 60 grams per meal to help with blood sugar control.   The need to take medication as prescribed, test blood sugar as directed, and to call between visits if there is a concern that blood sugar is uncontrolled is also discussed.   Mr. Mark Walls is reminded of the importance of daily foot exam, annual eye examination, and good blood sugar, blood pressure and cholesterol control.  Diabetic Labs Latest Ref Rng 04/19/2016 12/14/2015 01/27/2015 10/28/2014 10/05/2014  HbA1c <5.7 % - 7.7(H) 7.6(H) - 8.1(H)  Microalbumin Not estab mg/dL 1.0 - - 0.6 -  Micro/Creat Ratio <30 mcg/mg creat 4 - - 3.4 -  Chol 125 - 200 mg/dL - 219(H) 212(H) - 224(H)  HDL >=40 mg/dL - 34(L) 33(L) - 38(L)  Calc LDL <130 mg/dL - 139(H) 128(H) - 155(H)  Triglycerides <150 mg/dL - 229(H) 256(H) - 157(H)  Creatinine 0.70 - 1.25 mg/dL - 0.88 0.87 - 0.90   BP/Weight 04/19/2016 03/23/2016 02/11/2016 02/01/2016 12/14/2015 10/29/2015 [id number]  Systolic BP 123456 [id number] 0000000 [id number] - 123456  Diastolic BP 84 82 74 62 76 - 72  Wt. (Lbs) 211 210.6 215.2 215.2 213 212 212  BMI 33.04 32.98 33.7 33.7 33.35 33.2 33.2   Foot/eye exam completion dates 12/14/2015 10/28/2014  Foot Form Completion Done Done

## 2016-05-01 NOTE — Assessment & Plan Note (Signed)
Deteriorated. Patient re-educated about  the importance of commitment to a  minimum of 150 minutes of exercise per week.  The importance of healthy food choices with portion control discussed. Encouraged to start a food diary, count calories and to consider  joining a support group. Sample diet sheets offered. Goals set by the patient for the next several months.   Weight /BMI 04/19/2016 03/23/2016 02/11/2016  WEIGHT 211 lb 210 lb 9.6 oz 215 lb 3.2 oz  HEIGHT 5\' 7"  5\' 7"  5\' 7"   BMI 33.04 kg/m2 32.98 kg/m2 33.7 kg/m2    Current exercise per week 60 minutes.

## 2016-05-01 NOTE — Assessment & Plan Note (Signed)
omgoing problem compromising care, discussed this at visit again

## 2016-05-01 NOTE — Assessment & Plan Note (Signed)
Updated lab needed at/ before next visit. Hyperlipidemia:Low fat diet discussed and encouraged.   Lipid Panel  Lab Results  Component Value Date   CHOL 219* 12/14/2015   HDL 34* 12/14/2015   LDLCALC 139* 12/14/2015   TRIG 229* 12/14/2015   CHOLHDL 6.4* 12/14/2015

## 2016-05-04 ENCOUNTER — Ambulatory Visit (INDEPENDENT_AMBULATORY_CARE_PROVIDER_SITE_OTHER): Payer: Medicaid Other | Admitting: Orthopaedic Surgery

## 2016-05-04 ENCOUNTER — Encounter: Payer: Self-pay | Admitting: Orthopaedic Surgery

## 2016-05-04 VITALS — BP 150/67 | HR 57 | Temp 97.7°F | Ht 67.0 in | Wt 208.0 lb

## 2016-05-04 DIAGNOSIS — I10 Essential (primary) hypertension: Secondary | ICD-10-CM

## 2016-05-04 DIAGNOSIS — M545 Low back pain, unspecified: Secondary | ICD-10-CM

## 2016-05-04 DIAGNOSIS — J449 Chronic obstructive pulmonary disease, unspecified: Secondary | ICD-10-CM | POA: Diagnosis not present

## 2016-05-04 NOTE — Progress Notes (Signed)
Patient ID:Mark Walls, male DOB:09-21-1956, 60 y.o. ZOX:[id number]  Chief Complaint  Patient presents with  . Follow-up    Back pain    HPI  Mark Walls is a 60 y.o. male who has chronic lower back pain. He has had MRI, neurosurgical and neurology consultations.  No additional treatment has been recommended.  He has some increased pain with activity and weather changes.  He has no paresthesias now.  He has no new trauma.  He is doing his exercises and taking his medicines.  HPI  Body mass index is 32.57 kg/(m^2).  ROS  Review of Systems  Constitutional: Positive for fatigue.  HENT: Negative for congestion.   Respiratory: Negative for cough and shortness of breath.   Cardiovascular: Negative for chest pain and leg swelling.  Endocrine: Positive for cold intolerance and polyuria.  Musculoskeletal: Positive for myalgias, back pain and arthralgias.  Allergic/Immunologic: Positive for environmental allergies.  Psychiatric/Behavioral: Positive for sleep disturbance. The patient is nervous/anxious.     Past Medical History  Diagnosis Date  . PVD (peripheral vascular disease) (HCC)   . ED (erectile dysfunction)   . Anxiety   . Depression   . Obesity   . Chronic fatigue   . Nicotine addiction   . Diabetes mellitus, type 2 (HCC)   . Hyperlipidemia   . Hypertension     Past Surgical History  Procedure Laterality Date  . Cholecystectomy    . Polpectomy colon    . Esophagogastroduodenoscopy  09/24/2002    [name]:gastritis of the antrum and the bodt of the stomach otherwise normal  . Left knee athroscopy  03/2008  . Rotator cuff repair  09/01/08  . Right elbow    . Left hand    . Colonoscopy  10/01/2002    [name]:small internal hemorrhoids/single small sessile polyp in the rectum/ otherwise normal. Hyperplastic polyp.  . Right common iliac artery stenting    . Colonoscopy with esophagogastroduodenoscopy (egd) N/A 08/19/2013    [name] EG junction. Hiatal hernia. Abnormal  gastric mucosa s/p bx: Internal hemorrhoids;  Otherwise, normal ileocolonoscopy - Status post segmental biopsy    Family History  Problem Relation Age of Onset  . Cirrhosis Mother   . Melanoma Mother   . Mental illness Brother   . Mental illness Father     suicide   . Melanoma Brother   . Mental illness Brother   . Diabetes Brother   . Hypertension Brother   . Diabetes Brother   . Hypertension Brother   . Colon cancer Paternal Grandfather     age 1    Social History Social History  Substance Use Topics  . Smoking status: Former Smoker -- 1.00 packs/day for 40 years    Types: Cigarettes    Start date: 09/16/1970    Quit date: 11/17/2013  . Smokeless tobacco: Never Used  . Alcohol Use: No    Allergies  Allergen Reactions  . Allopurinol Nausea And Vomiting  . Levofloxacin Nausea And Vomiting  . Statins Other (See Comments)    Fatty liver, markedly elevated LFT  . Sulfamethoxazole-Trimethoprim Nausea And Vomiting  . Codeine Rash    Current Outpatient Prescriptions  Medication Sig Dispense Refill  . ACCU-CHEK AVIVA PLUS test strip TEST DAILY 50 each 2  . albuterol (PROVENTIL HFA;VENTOLIN HFA) 108 (90 BASE) MCG/ACT inhaler Inhale 2 puffs into the lungs every 6 (six) hours as needed for wheezing or shortness of breath. 1 Inhaler 2  . ALPRAZolam (XANAX) 1 MG tablet Take 1  mg by mouth 4 (four) times daily as needed for anxiety.    Marland Kitchen amphetamine-dextroamphetamine (ADDERALL) 10 MG tablet Take 10 mg by mouth 3 (three) times daily.     Marland Kitchen aspirin 81 MG tablet Take 81 mg by mouth daily.    Marland Kitchen azelastine (ASTELIN) 0.1 % nasal spray Place 2 sprays into both nostrils 2 (two) times daily. Use in each nostril as directed 30 mL 12  . Blood Glucose Monitoring Suppl (ACCU-CHEK AVIVA PLUS) w/Device KIT 1 kit for daily testing dx e11.9 1 kit 0  . fluticasone (FLONASE) 50 MCG/ACT nasal spray Place 2 sprays into both nostrils daily. 16 g 6  . gabapentin (NEURONTIN) 300 MG capsule Take 1  capsule (300 mg total) by mouth 2 (two) times daily. 60 capsule 3  . glipiZIDE (GLUCOTROL XL) 10 MG 24 hr tablet Take 1 tablet (10 mg total) by mouth daily with breakfast. 30 tablet 2  . losartan (COZAAR) 50 MG tablet Take 1 tablet (50 mg total) by mouth daily. 30 tablet 4  . metoprolol tartrate (LOPRESSOR) 25 MG tablet Take 0.5 tablets (12.5 mg total) by mouth 2 (two) times daily. 90 tablet 3  . montelukast (SINGULAIR) 10 MG tablet Take 1 tablet (10 mg total) by mouth at bedtime. 30 tablet 3  . oxyCODONE-acetaminophen (PERCOCET/ROXICET) 5-325 MG tablet Take 1 tablet by mouth every 4 (four) hours as needed for moderate pain or severe pain (Must last 30 days.  Do not drive or operate machinery while taking this medicine). 140 tablet 0  . predniSONE (STERAPRED UNI-PAK 21 TAB) 5 MG (21) TBPK tablet As directed on package 21 tablet 0  . promethazine-dextromethorphan (PROMETHAZINE-DM) 6.25-15 MG/5ML syrup Take 5 mLs by mouth at bedtime as needed for cough. 118 mL 0  . sitaGLIPtin-metformin (JANUMET) 50-1000 MG per tablet Take 1 tablet by mouth 2 (two) times daily with a meal.    . SYMBICORT 80-4.5 MCG/ACT inhaler INHALE 2 PUFFS TWICE DAILY. 10.2 g 2  . vardenafil (LEVITRA) 20 MG tablet Take 20 mg by mouth daily as needed.      Marland Kitchen ZETIA 10 MG tablet TAKE (1) TABLET BY MOUTH ONCE DAILY. 30 tablet 3   No current facility-administered medications for this visit.     Physical Exam  Blood pressure 150/67, pulse 57, temperature 97.7 F (36.5 C), height 5\' 7"  (1.702 m), weight 208 lb (94.348 kg).  Constitutional: overall normal hygiene, normal nutrition, well developed, normal grooming, normal body habitus. Assistive device:cane  Musculoskeletal: gait and station Limp right, muscle tone and strength are normal, no tremors or atrophy is present.  .  Neurological: coordination overall normal.  Deep tendon reflex/nerve stretch intact.  Sensation normal.  Cranial nerves II-XII intact.   Skin:   normal  overall no scars, lesions, ulcers or rashes. No psoriasis.  Psychiatric: Alert and oriented x 3.  Recent memory intact, remote memory unclear.  Normal mood and affect. Well groomed.  Good eye contact.  Cardiovascular: overall no swelling, no varicosities, no edema bilaterally, normal temperatures of the legs and arms, no clubbing, cyanosis and good capillary refill.  Lymphatic: palpation is normal. Spine/Pelvis examination:  Inspection:  Overall, sacoiliac joint benign and hips nontender; without crepitus or defects.   Thoracic spine inspection: Alignment normal without kyphosis present   Lumbar spine inspection:  Alignment  with normal lumbar lordosis, without scoliosis apparent.   Thoracic spine palpation:  with tenderness of spinal processes   Lumbar spine palpation: with tenderness of lumbar area; without tightness  of lumbar muscles    Range of Motion:   Lumbar flexion, forward flexion is 35  without pain or tenderness    Lumbar extension is 5  without pain or tenderness   Left lateral bend is Normal  without pain or tenderness   Right lateral bend is Normal without pain or tenderness   Straight leg raising is Normal   Strength & tone: Normal   Stability overall normal stability    The patient has been educated about the nature of the problem(s) and counseled on treatment options.  The patient appeared to understand what I have discussed and is in agreement with it.  Encounter Diagnoses  Name Primary?  . Midline low back pain without sciatica Yes  . Essential hypertension   . Chronic obstructive pulmonary disease, unspecified COPD type (HCC)     PLAN Call if any problems.  Precautions discussed.  Continue current medications.   Return to clinic 6 weeks

## 2016-05-10 ENCOUNTER — Encounter: Payer: Self-pay | Admitting: Cardiology

## 2016-05-10 ENCOUNTER — Ambulatory Visit: Payer: Medicaid Other | Admitting: Cardiology

## 2016-05-10 NOTE — Progress Notes (Unsigned)
Patient ID: Mark Walls, male   DOB: [birth year], 60 y.o.   MRN: [id number]     Clinical Summary Mark Walls is a 60 y.o.male seen today for follow up of the following medical problems.   1. Chest pain  - long history of chest pain, mixed in characteristics for cardiac chest pain -01/2014 Lexiscan MPI, moderate sized inferior defect with mild reversibility, LVEF 63%, overall low risk. This has been managed medically thus far  - episode of chest pain that was constant x 7 days, now resolved.  - compliant with ends  2. Hyperlipidemia  - elevated LFTs, fatty liver per PCP notes. Statins also cause some fatigue. Currently on zetia 10mg  daily.  - 01/2015 TC 212 TG 256 HDL 33 LDL 128 - compliant with zetia  3. HTN  - checks at home daily. Typically 160s/80s.   4. PAD  - prior stent placed 2005, right common iliac  - has had interrmittent nonclaudication like leg pain. Normal ABIs 01/2014 - MRI has shown lumbar disease and foraminal impingement, potential etiology  5. COPD  - poor compliance with inhalers.  - +wheezing at times. Better with inhaler.  - followed by pulmonary Past Medical History  Diagnosis Date  . PVD (peripheral vascular disease) (HCC)   . ED (erectile dysfunction)   . Anxiety   . Depression   . Obesity   . Chronic fatigue   . Nicotine addiction   . Diabetes mellitus, type 2 (HCC)   . Hyperlipidemia   . Hypertension      Allergies  Allergen Reactions  . Allopurinol Nausea And Vomiting  . Levofloxacin Nausea And Vomiting  . Statins Other (See Comments)    Fatty liver, markedly elevated LFT  . Sulfamethoxazole-Trimethoprim Nausea And Vomiting  . Codeine Rash     Current Outpatient Prescriptions  Medication Sig Dispense Refill  . ACCU-CHEK AVIVA PLUS test strip TEST DAILY 50 each 2  . albuterol (PROVENTIL HFA;VENTOLIN HFA) 108 (90 BASE) MCG/ACT inhaler Inhale 2 puffs into the lungs every 6 (six) hours as needed for wheezing or shortness of  breath. 1 Inhaler 2  . ALPRAZolam (XANAX) 1 MG tablet Take 1 mg by mouth 4 (four) times daily as needed for anxiety.    Marland Kitchen amphetamine-dextroamphetamine (ADDERALL) 10 MG tablet Take 10 mg by mouth 3 (three) times daily.     Marland Kitchen aspirin 81 MG tablet Take 81 mg by mouth daily.    Marland Kitchen azelastine (ASTELIN) 0.1 % nasal spray Place 2 sprays into both nostrils 2 (two) times daily. Use in each nostril as directed 30 mL 12  . Blood Glucose Monitoring Suppl (ACCU-CHEK AVIVA PLUS) w/Device KIT 1 kit for daily testing dx e11.9 1 kit 0  . fluticasone (FLONASE) 50 MCG/ACT nasal spray Place 2 sprays into both nostrils daily. 16 g 6  . gabapentin (NEURONTIN) 300 MG capsule Take 1 capsule (300 mg total) by mouth 2 (two) times daily. 60 capsule 3  . glipiZIDE (GLUCOTROL XL) 10 MG 24 hr tablet Take 1 tablet (10 mg total) by mouth daily with breakfast. 30 tablet 2  . losartan (COZAAR) 50 MG tablet Take 1 tablet (50 mg total) by mouth daily. 30 tablet 4  . metoprolol tartrate (LOPRESSOR) 25 MG tablet Take 0.5 tablets (12.5 mg total) by mouth 2 (two) times daily. 90 tablet 3  . montelukast (SINGULAIR) 10 MG tablet Take 1 tablet (10 mg total) by mouth at bedtime. 30 tablet 3  . oxyCODONE-acetaminophen (PERCOCET/ROXICET) 5-325 MG tablet Take  1 tablet by mouth every 4 (four) hours as needed for moderate pain or severe pain (Must last 30 days.  Do not drive or operate machinery while taking this medicine). 140 tablet 0  . predniSONE (STERAPRED UNI-PAK 21 TAB) 5 MG (21) TBPK tablet As directed on package 21 tablet 0  . promethazine-dextromethorphan (PROMETHAZINE-DM) 6.25-15 MG/5ML syrup Take 5 mLs by mouth at bedtime as needed for cough. 118 mL 0  . sitaGLIPtin-metformin (JANUMET) 50-1000 MG per tablet Take 1 tablet by mouth 2 (two) times daily with a meal.    . SYMBICORT 80-4.5 MCG/ACT inhaler INHALE 2 PUFFS TWICE DAILY. 10.2 g 2  . vardenafil (LEVITRA) 20 MG tablet Take 20 mg by mouth daily as needed.      Marland Kitchen ZETIA 10 MG tablet  TAKE (1) TABLET BY MOUTH ONCE DAILY. 30 tablet 3   No current facility-administered medications for this visit.     Past Surgical History  Procedure Laterality Date  . Cholecystectomy    . Polpectomy colon    . Esophagogastroduodenoscopy  09/24/2002    Mark Walls:gastritis of the antrum and the bodt of the stomach otherwise normal  . Left knee athroscopy  03/2008  . Rotator cuff repair  09/01/08  . Right elbow    . Left hand    . Colonoscopy  10/01/2002    Mark Walls:small internal hemorrhoids/single small sessile polyp in the rectum/ otherwise normal. Hyperplastic polyp.  . Right common iliac artery stenting    . Colonoscopy with esophagogastroduodenoscopy (egd) N/A 08/19/2013    GEX:[id number] EG junction. Hiatal hernia. Abnormal gastric mucosa s/p bx: Internal hemorrhoids;  Otherwise, normal ileocolonoscopy - Status post segmental biopsy     Allergies  Allergen Reactions  . Allopurinol Nausea And Vomiting  . Levofloxacin Nausea And Vomiting  . Statins Other (See Comments)    Fatty liver, markedly elevated LFT  . Sulfamethoxazole-Trimethoprim Nausea And Vomiting  . Codeine Rash      Family History  Problem Relation Age of Onset  . Cirrhosis Mother   . Melanoma Mother   . Mental illness Brother   . Mental illness Father     suicide   . Melanoma Brother   . Mental illness Brother   . Diabetes Brother   . Hypertension Brother   . Diabetes Brother   . Hypertension Brother   . Colon cancer Paternal Grandfather     age 54     Social History Mark Walls reports that he quit smoking about 2 years ago. His smoking use included Cigarettes. He started smoking about 45 years ago. He has a 40 pack-year smoking history. He has never used smokeless tobacco. Mark Walls reports that he does not drink alcohol.   Review of Systems CONSTITUTIONAL: No weight loss, fever, chills, weakness or fatigue.  HEENT: Eyes: No visual loss, blurred vision, double vision or yellow sclerae.No hearing  loss, sneezing, congestion, runny nose or sore throat.  SKIN: No rash or itching.  CARDIOVASCULAR:  RESPIRATORY: No shortness of breath, cough or sputum.  GASTROINTESTINAL: No anorexia, nausea, vomiting or diarrhea. No abdominal pain or blood.  GENITOURINARY: No burning on urination, no polyuria NEUROLOGICAL: No headache, dizziness, syncope, paralysis, ataxia, numbness or tingling in the extremities. No change in bowel or bladder control.  MUSCULOSKELETAL: No muscle, back pain, joint pain or stiffness.  LYMPHATICS: No enlarged nodes. No history of splenectomy.  PSYCHIATRIC: No history of depression or anxiety.  ENDOCRINOLOGIC: No reports of sweating, cold or heat intolerance. No polyuria or polydipsia.  Marland Kitchen  Physical Examination There were no vitals filed for this visit. There were no vitals filed for this visit.  Gen: resting comfortably, no acute distress HEENT: no scleral icterus, pupils equal round and reactive, no palptable cervical adenopathy,  CV Resp: Clear to auscultation bilaterally GI: abdomen is soft, non-tender, non-distended, normal bowel sounds, no hepatosplenomegaly MSK: extremities are warm, no edema.  Skin: warm, no rash Neuro:  no focal deficits Psych: appropriate affect   Diagnostic Studies 11/2013 CXR  No abnormality   11/2013 Labs: Cr 0.8 K 4.4 Hgb 13.9 Plt 214   2005 Cath  FINDINGS:  1. LV: 136/10/19. EF 65% without regional wall motion abnormal.  2. No aortic stenosis or mitral regurgitation.  3. Left main: Angiographically normal.  4. LAD: Moderate-sized vessel giving rise to three diagonal branches.  First two diagonals are quite small and the third is larger. The LAD  and its branches are normal.  5. Circumflex: Large, codominant vessel giving rise of two obtuse  marginals in the PDA. It is angiographically normal. 6. RCA: Moderate-  sized, codominant vessel. It is angiographically normal.  6. Abdominal aorta: Normal abdominal  aorta. It gives rise to single renal  arteries bilaterally. Both of these are normal. 8. Right leg: Severe  common iliac stenosis with peak gradient of 40 mmHg and a mean gradient  of 15 mmHg at rest. The remainder of the common iliac as well as the  internal iliac, external iliac, common femoral, and proximal portions of  the SFA and profunda are normal.  7. Left leg: There is a mild stenosis of the common iliac artery  (approximately 20%). The remainder of the common as well as the  internal iliac, external iliac, common femoral and proximal portion of  the SFA is normal. There is an approximately 50% stenosis of the  proximal profunda.  IMPRESSION/RECOMMENDATIONS:  1. Angiographically normal coronary arteries with normal LV size and  systolic function. No aortic stenosis or mitral regurgitation. I  suspect a non-cardiac etiology to his chest pain. Despite these  negative findings, his atherosclerotic peripheral vascular disease  places him at high risk for subsequent coronary event. Thus, aggressive  secondary prevention efforts should be continued.  2. 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 Stenting is recommended.   01/21/14 Lexiscan Nuclear Stress Test  Tomographic views were obtained using the short axis, vertical long axis, and horizontal long axis planes. There was a moderate-sized, dense, inferior defect from apical to mid level, partial reversibility noted from mid to basal level. This is most suggestive of inferior scar with peri-infarct ischemia of mild degree in the mid to basal segment.  Gated imaging revealed an EDV of 82, ESV of 31, TID ratio 0.9, and LVEF of 63% without obvious wall motion abnormalities.  IMPRESSION: Abnormal, but relatively low risk Lexiscan Cardiolite as outlined. Patient was not able to ambulate on the treadmill to achieve adequate heart rate response due to shortness of breath and  probable claudication. There were no diagnostic ST segment changes with Lexiscan. Perfusion imaging is most consistent with inferior wall scar and mild peri-infarct ischemia in the mid to basal segment. There is however normal wall motion in this region on gated imaging with LVEF 63% and normal LV volumes. Suggests RCA distribution ischemia.  01/2014 ABI  Right 1.1 Left 1.1   2/15 Echo  LVEF 55-60%, grade I diastolic dysfunction, technically difficult study     Assessment and Plan  1. Chest pain  - previous  stress MPI was low risk, suggestion of inferior scar with mild peri-infarct ischemia, normal wall motion and LVEF  - no recent symptoms of cardicac like chest pain, will continue current meds.   2. Leg pain/PAD  - normal ABIs recently, MRI with evidence of lumbar disease that is likely etiology.  - management per pcp  3. HTN  - above goal based on his DM2 history, he will keep bp log and submit in 1week. If persistently elevated will titrate up meds  4. Hyperlipidemia  - unable to tolerate statins including most recently low dose pravastatin, we will continue zetia.    F/u 3 months      Antoine Poche, M.D.

## 2016-05-18 ENCOUNTER — Other Ambulatory Visit: Payer: Self-pay | Admitting: Family Medicine

## 2016-05-18 ENCOUNTER — Telehealth: Payer: Self-pay | Admitting: Orthopaedic Surgery

## 2016-05-18 MED ORDER — OXYCODONE-ACETAMINOPHEN 5-325 MG PO TABS: 1.0000 | ORAL_TABLET | ORAL | Status: DC | PRN

## 2016-05-18 NOTE — Telephone Encounter (Signed)
Rx done. 

## 2016-05-18 NOTE — Telephone Encounter (Signed)
Patient called and requested a refill on Oxycodone-Acetaminophen(Perocet)  5-325 mgs.  Qty  140        Sig: Take 1 tablet by mouth every 4 (four) hours as needed for moderate pain or severe pain (Must last 30 days. Do not drive or operate machinery while taking this medicine).

## 2016-06-15 ENCOUNTER — Other Ambulatory Visit: Payer: Self-pay | Admitting: Family Medicine

## 2016-06-15 ENCOUNTER — Encounter: Payer: Self-pay | Admitting: Orthopaedic Surgery

## 2016-06-15 ENCOUNTER — Ambulatory Visit (INDEPENDENT_AMBULATORY_CARE_PROVIDER_SITE_OTHER): Payer: Medicaid Other | Admitting: Orthopaedic Surgery

## 2016-06-15 VITALS — BP 147/69 | HR 61 | Temp 97.5°F | Resp 16 | Ht 67.5 in | Wt 201.0 lb

## 2016-06-15 DIAGNOSIS — M545 Low back pain, unspecified: Secondary | ICD-10-CM

## 2016-06-15 DIAGNOSIS — I10 Essential (primary) hypertension: Secondary | ICD-10-CM

## 2016-06-15 DIAGNOSIS — J449 Chronic obstructive pulmonary disease, unspecified: Secondary | ICD-10-CM | POA: Diagnosis not present

## 2016-06-15 MED ORDER — OXYCODONE-ACETAMINOPHEN 5-325 MG PO TABS: 1.0000 | ORAL_TABLET | ORAL | Status: DC | PRN

## 2016-06-15 NOTE — Progress Notes (Signed)
Patient ID:Mark Walls, male DOB:[birth year], 60 y.o. ZOX:[id number]  Chief Complaint  Patient presents with  . Follow-up    back pain    HPI  Mark Walls is a 60 y.o. male who has chronic lower back pain that is stable.He has no new trauma, no paresthesias.  He is doing his exercises.  He has no bowel or bladder problems, he is walking regularly.  HPI  Body mass index is 31 kg/(m^2).  ROS  Review of Systems  Constitutional: Positive for fatigue.  HENT: Negative for congestion.   Respiratory: Negative for cough and shortness of breath.   Cardiovascular: Negative for chest pain and leg swelling.  Endocrine: Positive for cold intolerance and polyuria.  Musculoskeletal: Positive for myalgias, back pain and arthralgias.  Allergic/Immunologic: Positive for environmental allergies.  Psychiatric/Behavioral: Positive for sleep disturbance. The patient is nervous/anxious.     Past Medical History  Diagnosis Date  . PVD (peripheral vascular disease) (HCC)   . ED (erectile dysfunction)   . Anxiety   . Depression   . Obesity   . Chronic fatigue   . Nicotine addiction   . Diabetes mellitus, type 2 (HCC)   . Hyperlipidemia   . Hypertension     Past Surgical History  Procedure Laterality Date  . Cholecystectomy    . Polpectomy colon    . Esophagogastroduodenoscopy  09/24/2002    [name]:gastritis of the antrum and the bodt of the stomach otherwise normal  . Left knee athroscopy  03/2008  . Rotator cuff repair  09/01/08  . Right elbow    . Left hand    . Colonoscopy  10/01/2002    [name]:small internal hemorrhoids/single small sessile polyp in the rectum/ otherwise normal. Hyperplastic polyp.  . Right common iliac artery stenting    . Colonoscopy with esophagogastroduodenoscopy (egd) N/A 08/19/2013    [name] EG junction. Hiatal hernia. Abnormal gastric mucosa s/p bx: Internal hemorrhoids;  Otherwise, normal ileocolonoscopy - Status post segmental biopsy    Family History   Problem Relation Age of Onset  . Cirrhosis Mother   . Melanoma Mother   . Mental illness Brother   . Mental illness Father     suicide   . Melanoma Brother   . Mental illness Brother   . Diabetes Brother   . Hypertension Brother   . Diabetes Brother   . Hypertension Brother   . Colon cancer Paternal Grandfather     age 40    Social History Social History  Substance Use Topics  . Smoking status: Former Smoker -- 1.00 packs/day for 40 years    Types: Cigarettes    Start date: 09/16/1970    Quit date: 11/17/2013  . Smokeless tobacco: Never Used  . Alcohol Use: No    Allergies  Allergen Reactions  . Allopurinol Nausea And Vomiting  . Levofloxacin Nausea And Vomiting  . Statins Other (See Comments)    Fatty liver, markedly elevated LFT  . Sulfamethoxazole-Trimethoprim Nausea And Vomiting  . Codeine Rash    Current Outpatient Prescriptions  Medication Sig Dispense Refill  . ACCU-CHEK AVIVA PLUS test strip TEST DAILY 50 each 2  . albuterol (PROVENTIL HFA;VENTOLIN HFA) 108 (90 BASE) MCG/ACT inhaler Inhale 2 puffs into the lungs every 6 (six) hours as needed for wheezing or shortness of breath. 1 Inhaler 2  . ALPRAZolam (XANAX) 1 MG tablet Take 1 mg by mouth 4 (four) times daily as needed for anxiety.    Marland Kitchen amphetamine-dextroamphetamine (ADDERALL) 10 MG tablet Take  10 mg by mouth 3 (three) times daily.     Marland Kitchen aspirin 81 MG tablet Take 81 mg by mouth daily.    Marland Kitchen azelastine (ASTELIN) 0.1 % nasal spray Place 2 sprays into both nostrils 2 (two) times daily. Use in each nostril as directed 30 mL 12  . Blood Glucose Monitoring Suppl (ACCU-CHEK AVIVA PLUS) w/Device KIT 1 kit for daily testing dx e11.9 1 kit 0  . fluticasone (FLONASE) 50 MCG/ACT nasal spray Place 2 sprays into both nostrils daily. 16 g 6  . gabapentin (NEURONTIN) 300 MG capsule Take 1 capsule (300 mg total) by mouth 2 (two) times daily. 60 capsule 3  . glipiZIDE (GLUCOTROL XL) 10 MG 24 hr tablet TAKE (1) TABLET ONCE  DAILY WITH BREAKFAST. 30 tablet 3  . losartan (COZAAR) 50 MG tablet Take 1 tablet (50 mg total) by mouth daily. 30 tablet 4  . metoprolol tartrate (LOPRESSOR) 25 MG tablet Take 0.5 tablets (12.5 mg total) by mouth 2 (two) times daily. 90 tablet 3  . montelukast (SINGULAIR) 10 MG tablet TAKE 1 TABLET BY MOUTH AT BEDTIME. 30 tablet 3  . oxyCODONE-acetaminophen (PERCOCET/ROXICET) 5-325 MG tablet Take 1 tablet by mouth every 4 (four) hours as needed for moderate pain or severe pain (Must last 30 days.  Do not drive or operate machinery while taking this medicine). 140 tablet 0  . predniSONE (STERAPRED UNI-PAK 21 TAB) 5 MG (21) TBPK tablet As directed on package 21 tablet 0  . promethazine-dextromethorphan (PROMETHAZINE-DM) 6.25-15 MG/5ML syrup Take 5 mLs by mouth at bedtime as needed for cough. 118 mL 0  . sitaGLIPtin-metformin (JANUMET) 50-1000 MG per tablet Take 1 tablet by mouth 2 (two) times daily with a meal.    . SYMBICORT 80-4.5 MCG/ACT inhaler INHALE 2 PUFFS TWICE DAILY. 10.2 g 2  . vardenafil (LEVITRA) 20 MG tablet Take 20 mg by mouth daily as needed.      Marland Kitchen ZETIA 10 MG tablet TAKE (1) TABLET BY MOUTH ONCE DAILY. 30 tablet 3   No current facility-administered medications for this visit.     Physical Exam  Blood pressure 147/69, pulse 61, temperature 97.5 F (36.4 C), resp. rate 16, height 5' 7.5" (1.715 m), weight 201 lb (91.173 kg).  Constitutional: overall normal hygiene, normal nutrition, well developed, normal grooming, normal body habitus. Assistive device:cane  Musculoskeletal: gait and station Limp lef56, muscle tone and strength are normal, no tremors or atrophy is present.  .  Neurological: coordination overall normal.  Deep tendon reflex/nerve stretch intact.  Sensation normal.  Cranial nerves II-XII intact.   Skin:   normal overall no scars, lesions, ulcers or rashes. No psoriasis.  Psychiatric: Alert and oriented x 3.  Recent memory intact, remote memory unclear.   Normal mood and affect. Well groomed.  Good eye contact.  Cardiovascular: overall no swelling, no varicosities, no edema bilaterally, normal temperatures of the legs and arms, no clubbing, cyanosis and good capillary refill.  Lymphatic: palpation is normal.  Spine/Pelvis examination:  Inspection:  Overall, sacoiliac joint benign and hips nontender; without crepitus or defects.   Thoracic spine inspection: Alignment normal without kyphosis present   Lumbar spine inspection:  Alignment  with normal lumbar lordosis, without scoliosis apparent.   Thoracic spine palpation:  without tenderness of spinal processes   Lumbar spine palpation: with tenderness of lumbar area; without tightness of lumbar muscles    Range of Motion:   Lumbar flexion, forward flexion is 30  with pain or tenderness  Lumbar extension is 10  without pain or tenderness   Left lateral bend is Normal  without pain or tenderness   Right lateral bend is Normal without pain or tenderness   Straight leg raising is Normal   Strength & tone: Normal   Stability overall normal stability     The patient has been educated about the nature of the problem(s) and counseled on treatment options.  The patient appeared to understand what I have discussed and is in agreement with it.  Encounter Diagnoses  Name Primary?  . Midline low back pain without sciatica Yes  . Essential hypertension   . Chronic obstructive pulmonary disease, unspecified COPD type (HCC)     PLAN Call if any problems.  Precautions discussed.  Continue current medications.   Return to clinic 2 months   Electronically Signed Darreld Mclean, MD 6/29/20179:27 AM

## 2016-07-11 LAB — HM DIABETES EYE EXAM

## 2016-07-13 ENCOUNTER — Telehealth: Payer: Self-pay | Admitting: Orthopaedic Surgery

## 2016-07-13 MED ORDER — OXYCODONE-ACETAMINOPHEN 5-325 MG PO TABS: 1.0000 | ORAL_TABLET | ORAL | 0 refills | Status: DC | PRN

## 2016-07-13 NOTE — Telephone Encounter (Signed)
Oxycodone-Acetaminophen 5/325mg   Qty 140 Tablets

## 2016-07-17 ENCOUNTER — Telehealth: Payer: Self-pay | Admitting: Family Medicine

## 2016-07-17 NOTE — Telephone Encounter (Signed)
Pls call pt, ALL lab orders given in may still not doen and are PAST due, NEEDS them ijn next 1 to 2 weeks, no HBA1C since 11/2015. Pls reprint so he can get them,.  Thanks!

## 2016-07-18 NOTE — Telephone Encounter (Signed)
Attempted to reach patient.  Phone service is not active.

## 2016-07-19 NOTE — Telephone Encounter (Signed)
Labs mailed with letter to have them done asap

## 2016-07-28 ENCOUNTER — Other Ambulatory Visit: Payer: Self-pay | Admitting: Family Medicine

## 2016-07-29 LAB — COMPLETE METABOLIC PANEL WITH GFR
ALBUMIN: 4.1 g/dL (ref 3.6–5.1)
ALK PHOS: 63 U/L (ref 40–115)
ALT: 46 U/L (ref 9–46)
AST: 23 U/L (ref 10–35)
BILIRUBIN TOTAL: 0.6 mg/dL (ref 0.2–1.2)
BUN: 20 mg/dL (ref 7–25)
CALCIUM: 9.9 mg/dL (ref 8.6–10.3)
CHLORIDE: 105 mmol/L (ref 98–110)
CO2: 28 mmol/L (ref 20–31)
CREATININE: 0.89 mg/dL (ref 0.70–1.25)
GFR, Est Non African American: >89 mL/min (ref >=60)
Glucose, Bld: 94 mg/dL (ref 65–99)
Potassium: 4.8 mmol/L (ref 3.5–5.3)
Sodium: 141 mmol/L (ref 135–146)
TOTAL PROTEIN: 6.9 g/dL (ref 6.1–8.1)

## 2016-07-29 LAB — LIPID PANEL
CHOL/HDL RATIO: 4.6 ratio (ref <=5.0)
Cholesterol: 208 mg/dL — ABNORMAL HIGH (ref 125–200)
HDL: 45 mg/dL (ref >=40)
LDL Cholesterol: 139 mg/dL — ABNORMAL HIGH (ref <130)
TRIGLYCERIDES: 119 mg/dL (ref <150)
VLDL: 24 mg/dL (ref <30)

## 2016-07-29 LAB — MICROALBUMIN / CREATININE URINE RATIO
CREATININE, URINE: 87 mg/dL (ref 20–370)
MICROALB UR: 1 mg/dL
Microalb Creat Ratio: 11 mcg/mg creat (ref <30)

## 2016-07-29 LAB — HEMOGLOBIN A1C
HEMOGLOBIN A1C: 6 % — AB (ref <5.7)
Mean Plasma Glucose: 126 mg/dL

## 2016-07-29 LAB — PSA: PSA: 1.2 ng/mL (ref <=4.0)

## 2016-08-15 ENCOUNTER — Encounter: Payer: Self-pay | Admitting: Orthopaedic Surgery

## 2016-08-15 ENCOUNTER — Ambulatory Visit (INDEPENDENT_AMBULATORY_CARE_PROVIDER_SITE_OTHER): Payer: Medicaid Other | Admitting: Orthopaedic Surgery

## 2016-08-15 VITALS — BP 136/68 | HR 63 | Temp 97.7°F | Ht 67.5 in | Wt 202.0 lb

## 2016-08-15 DIAGNOSIS — I10 Essential (primary) hypertension: Secondary | ICD-10-CM

## 2016-08-15 DIAGNOSIS — M545 Low back pain, unspecified: Secondary | ICD-10-CM

## 2016-08-15 DIAGNOSIS — J449 Chronic obstructive pulmonary disease, unspecified: Secondary | ICD-10-CM | POA: Diagnosis not present

## 2016-08-15 MED ORDER — OXYCODONE-ACETAMINOPHEN 5-325 MG PO TABS: 1.0000 | ORAL_TABLET | Freq: Four times a day (QID) | ORAL | 0 refills | Status: DC | PRN

## 2016-08-15 NOTE — Progress Notes (Signed)
Patient ID:Mark Walls, male DOB:[birth year], 60 y.o. AVW:[id number]  Chief Complaint  Patient presents with  . Follow-up    Back pain    HPI  Mark Walls is a 60 y.o. male who has lower back pain chronically.  He has no paresthesias, no new trauma, no bowel or bladder problems. He is taking his medicine and doing his exercises.  HPI  Body mass index is 31.17 kg/m.  ROS  Review of Systems  Constitutional: Positive for fatigue.  HENT: Negative for congestion.   Respiratory: Negative for cough and shortness of breath.   Cardiovascular: Negative for chest pain and leg swelling.  Endocrine: Positive for cold intolerance and polyuria.  Musculoskeletal: Positive for arthralgias, back pain and myalgias.  Allergic/Immunologic: Positive for environmental allergies.  Psychiatric/Behavioral: Positive for sleep disturbance. The patient is nervous/anxious.     Past Medical History:  Diagnosis Date  . Anxiety   . Chronic fatigue   . Depression   . Diabetes mellitus, type 2 ([facility])   . ED (erectile dysfunction)   . Hyperlipidemia   . Hypertension   . Nicotine addiction   . Obesity   . PVD (peripheral vascular disease) ([place])     Past Surgical History:  Procedure Laterality Date  . CHOLECYSTECTOMY    . COLONOSCOPY  10/01/2002   [name]:small internal hemorrhoids/single small sessile polyp in the rectum/ otherwise normal. Hyperplastic polyp.  . COLONOSCOPY WITH ESOPHAGOGASTRODUODENOSCOPY (EGD) N/A 08/19/2013   WGN:[id number] EG junction. Hiatal hernia. Abnormal gastric mucosa s/p bx: Internal hemorrhoids;  Otherwise, normal ileocolonoscopy - Status post segmental biopsy  . ESOPHAGOGASTRODUODENOSCOPY  09/24/2002   [name]:gastritis of the antrum and the bodt of the stomach otherwise normal  . left hand    . left knee athroscopy  03/2008  . polpectomy colon    . right common iliac artery stenting    . right elbow    . ROTATOR CUFF REPAIR  09/01/08    Family History  Problem Relation  Age of Onset  . Cirrhosis Mother   . Melanoma Mother   . Mental illness Father     suicide   . Mental illness Brother   . Diabetes Brother   . Hypertension Brother   . Diabetes Brother   . Hypertension Brother   . Mental illness Brother   . Melanoma Brother   . Colon cancer Paternal Grandfather     age 42    Social History Social History  Substance Use Topics  . Smoking status: Former Smoker    Packs/day: 1.00    Years: 40.00    Types: Cigarettes    Start date: 09/16/1970    Quit date: 11/17/2013  . Smokeless tobacco: Never Used  . Alcohol use No    Allergies  Allergen Reactions  . Allopurinol Nausea And Vomiting  . Levofloxacin Nausea And Vomiting  . Statins Other (See Comments)    Fatty liver, markedly elevated LFT  . Sulfamethoxazole-Trimethoprim Nausea And Vomiting  . Codeine Rash    Current Outpatient Prescriptions  Medication Sig Dispense Refill  . ACCU-CHEK AVIVA PLUS test strip TEST DAILY 50 each 2  . albuterol (PROVENTIL HFA;VENTOLIN HFA) 108 (90 BASE) MCG/ACT inhaler Inhale 2 puffs into the lungs every 6 (six) hours as needed for wheezing or shortness of breath. 1 Inhaler 2  . ALPRAZolam (XANAX) 1 MG tablet Take 1 mg by mouth 4 (four) times daily as needed for anxiety.    Marland Kitchen amphetamine-dextroamphetamine (ADDERALL) 10 MG tablet Take 10 mg by mouth  3 (three) times daily.     Marland Kitchen aspirin 81 MG tablet Take 81 mg by mouth daily.    Marland Kitchen azelastine (ASTELIN) 0.1 % nasal spray Place 2 sprays into both nostrils 2 (two) times daily. Use in each nostril as directed 30 mL 12  . Blood Glucose Monitoring Suppl (ACCU-CHEK AVIVA PLUS) w/Device KIT 1 kit for daily testing dx e11.9 1 kit 0  . fluticasone (FLONASE) 50 MCG/ACT nasal spray Place 2 sprays into both nostrils daily. 16 g 6  . gabapentin (NEURONTIN) 300 MG capsule Take 1 capsule (300 mg total) by mouth 2 (two) times daily. 60 capsule 3  . glipiZIDE (GLUCOTROL XL) 10 MG 24 hr tablet TAKE (1) TABLET ONCE DAILY WITH  BREAKFAST. 30 tablet 3  . losartan (COZAAR) 50 MG tablet Take 1 tablet (50 mg total) by mouth daily. 30 tablet 4  . metoprolol tartrate (LOPRESSOR) 25 MG tablet Take 0.5 tablets (12.5 mg total) by mouth 2 (two) times daily. 90 tablet 3  . montelukast (SINGULAIR) 10 MG tablet TAKE 1 TABLET BY MOUTH AT BEDTIME. 30 tablet 3  . oxyCODONE-acetaminophen (PERCOCET/ROXICET) 5-325 MG tablet Take 1 tablet by mouth every 6 (six) hours as needed for moderate pain or severe pain (Must last 14 days.Do not drive or operate machinery while taking this medicine). 56 tablet 0  . predniSONE (STERAPRED UNI-PAK 21 TAB) 5 MG (21) TBPK tablet As directed on package 21 tablet 0  . promethazine-dextromethorphan (PROMETHAZINE-DM) 6.25-15 MG/5ML syrup Take 5 mLs by mouth at bedtime as needed for cough. 118 mL 0  . sitaGLIPtin-metformin (JANUMET) 50-1000 MG per tablet Take 1 tablet by mouth 2 (two) times daily with a meal.    . SYMBICORT 80-4.5 MCG/ACT inhaler INHALE 2 PUFFS TWICE DAILY. 10.2 g 1  . vardenafil (LEVITRA) 20 MG tablet Take 20 mg by mouth daily as needed.      Marland Kitchen ZETIA 10 MG tablet TAKE (1) TABLET BY MOUTH ONCE DAILY. 30 tablet 3   No current facility-administered medications for this visit.      Physical Exam  Blood pressure 136/68, pulse 63, temperature 97.7 F (36.5 C), height 5' 7.5" (1.715 m), weight 202 lb (91.6 kg).  Constitutional: overall normal hygiene, normal nutrition, well developed, normal grooming, normal body habitus. Assistive device:none  Musculoskeletal: gait and station Limp none, muscle tone and strength are normal, no tremors or atrophy is present.  .  Neurological: coordination overall normal.  Deep tendon reflex/nerve stretch intact.  Sensation normal.  Cranial nerves II-XII intact.   Skin:   normal overall no scars, lesions, ulcers or rashes. No psoriasis.  Psychiatric: Alert and oriented x 3.  Recent memory intact, remote memory unclear.  Normal mood and affect. Well  groomed.  Good eye contact.  Cardiovascular: overall no swelling, no varicosities, no edema bilaterally, normal temperatures of the legs and arms, no clubbing, cyanosis and good capillary refill.  Lymphatic: palpation is normal.  Spine/Pelvis examination:  Inspection:  Overall, sacoiliac joint benign and hips nontender; without crepitus or defects.   Thoracic spine inspection: Alignment normal without kyphosis present   Lumbar spine inspection:  Alignment  with normal lumbar lordosis, without scoliosis apparent.   Thoracic spine palpation:  without tenderness of spinal processes   Lumbar spine palpation: with tenderness of lumbar area; without tightness of lumbar muscles    Range of Motion:   Lumbar flexion, forward flexion is 45 without pain or tenderness    Lumbar extension is 10 without pain or tenderness  Left lateral bend is Normal  without pain or tenderness   Right lateral bend is Normal without pain or tenderness   Straight leg raising is Normal   Strength & tone: Normal   Stability overall normal stability     The patient has been educated about the nature of the problem(s) and counseled on treatment options.  The patient appeared to understand what I have discussed and is in agreement with it.  Encounter Diagnoses  Name Primary?  . Essential hypertension Yes  . Chronic obstructive pulmonary disease, unspecified COPD type (HCC)   . Midline low back pain without sciatica     PLAN Call if any problems.  Precautions discussed.  Continue current medications.   Return to clinic 3 months   Electronically Signed Darreld Mclean, MD 8/29/201710:19 AM

## 2016-08-18 ENCOUNTER — Other Ambulatory Visit: Payer: Self-pay | Admitting: Family Medicine

## 2016-08-22 ENCOUNTER — Telehealth: Payer: Self-pay | Admitting: Orthopaedic Surgery

## 2016-08-22 NOTE — Telephone Encounter (Signed)
Patient inquiring about status of prior authorization for prescription refill: oxyCODONE-acetaminophen (PERCOCET/ROXICET) 5-325 MG tablet

## 2016-08-23 MED ORDER — OXYCODONE-ACETAMINOPHEN 5-325 MG PO TABS: 1.0000 | ORAL_TABLET | Freq: Four times a day (QID) | ORAL | 0 refills | Status: DC | PRN

## 2016-08-28 ENCOUNTER — Telehealth: Payer: Self-pay | Admitting: Family Medicine

## 2016-08-28 ENCOUNTER — Telehealth: Payer: Self-pay | Admitting: Pulmonary Disease

## 2016-08-28 DIAGNOSIS — G4733 Obstructive sleep apnea (adult) (pediatric): Secondary | ICD-10-CM

## 2016-08-28 NOTE — Telephone Encounter (Signed)
Attempted to call pt's wife. Received a recording that the caller was not accepting calls at this time. Will try back later.

## 2016-08-28 NOTE — Telephone Encounter (Signed)
No one in triage- per Jess informed patient wife that we will research the reason for no cpap and call her back - Patient can be reached at [phone]

## 2016-08-29 NOTE — Telephone Encounter (Signed)
Opened in Error.

## 2016-08-30 NOTE — Telephone Encounter (Signed)
PSG 08/2014 showed severe OSA, stop breathing 34 per hour.  Okay to proceed with CPAP titration study

## 2016-08-30 NOTE — Telephone Encounter (Signed)
Patients wife came into the lobby on 08/28/16 wanting to know why patient has not received CPAP machine.  Called and spoke with patient today, he said that he has been waiting for CPAP machine.  Advised him that he has not been seen since 2015.  He had his sleep study done in 2015 and was supposed to have a CPAP titration study.  Patient wants to know if he can get the titration study scheduled now.  Order was placed in 2015.  Dr. Elsworth Soho, please advise if okay to reorder this test or does patient need OV before we can order the test again?

## 2016-08-31 ENCOUNTER — Other Ambulatory Visit: Payer: Self-pay | Admitting: *Deleted

## 2016-08-31 DIAGNOSIS — G4733 Obstructive sleep apnea (adult) (pediatric): Secondary | ICD-10-CM

## 2016-08-31 NOTE — Telephone Encounter (Signed)
Spoke with pt's wife. She is aware that we are going to set up CPAP titration study. Order has been placed. Nothing further was needed at this time.

## 2016-08-31 NOTE — Addendum Note (Signed)
Addended by: Desmond Dike C on: 08/31/2016 10:13 AM   Modules accepted: Orders

## 2016-09-05 ENCOUNTER — Ambulatory Visit (INDEPENDENT_AMBULATORY_CARE_PROVIDER_SITE_OTHER): Payer: Medicaid Other | Admitting: Family Medicine

## 2016-09-05 ENCOUNTER — Encounter: Payer: Self-pay | Admitting: Family Medicine

## 2016-09-05 ENCOUNTER — Encounter: Payer: Medicaid Other | Admitting: Family Medicine

## 2016-09-05 ENCOUNTER — Telehealth: Payer: Self-pay | Admitting: Orthopaedic Surgery

## 2016-09-05 VITALS — BP 140/70 | HR 87 | Temp 98.2°F | Resp 16 | Ht 67.5 in | Wt 207.0 lb

## 2016-09-05 DIAGNOSIS — M5410 Radiculopathy, site unspecified: Secondary | ICD-10-CM

## 2016-09-05 DIAGNOSIS — E119 Type 2 diabetes mellitus without complications: Secondary | ICD-10-CM | POA: Diagnosis not present

## 2016-09-05 DIAGNOSIS — Z0000 Encounter for general adult medical examination without abnormal findings: Secondary | ICD-10-CM | POA: Diagnosis not present

## 2016-09-05 DIAGNOSIS — Z1211 Encounter for screening for malignant neoplasm of colon: Secondary | ICD-10-CM

## 2016-09-05 DIAGNOSIS — E785 Hyperlipidemia, unspecified: Secondary | ICD-10-CM

## 2016-09-05 DIAGNOSIS — Z23 Encounter for immunization: Secondary | ICD-10-CM | POA: Diagnosis not present

## 2016-09-05 DIAGNOSIS — E669 Obesity, unspecified: Secondary | ICD-10-CM

## 2016-09-05 DIAGNOSIS — I10 Essential (primary) hypertension: Secondary | ICD-10-CM

## 2016-09-05 DIAGNOSIS — E1169 Type 2 diabetes mellitus with other specified complication: Secondary | ICD-10-CM

## 2016-09-05 LAB — HEMOCCULT GUIAC POC 1CARD (OFFICE): FECAL OCCULT BLD: NEGATIVE

## 2016-09-05 MED ORDER — KETOROLAC TROMETHAMINE 60 MG/2ML IM SOLN
60.0000 mg | Freq: Once | INTRAMUSCULAR | Status: AC
  Administered 2016-09-05: 60 mg via INTRAMUSCULAR

## 2016-09-05 MED ORDER — METHYLPREDNISOLONE ACETATE 80 MG/ML IJ SUSP
80.0000 mg | Freq: Once | INTRAMUSCULAR | Status: AC
  Administered 2016-09-05: 80 mg via INTRAMUSCULAR

## 2016-09-05 MED ORDER — OXYCODONE-ACETAMINOPHEN 5-325 MG PO TABS: 1.0000 | ORAL_TABLET | Freq: Four times a day (QID) | ORAL | 0 refills | Status: DC | PRN

## 2016-09-05 NOTE — Progress Notes (Signed)
Mark Walls     MRN: [id number]      DOB: [birth year]   HPI: Patient is in for annual physical exam. C/o increased back pain , requests injections Recent labs,  are reviewed and have improved Immunization is reviewed , and  updated .    PE; Pleasant male, alert and oriented x 3, in no cardio-pulmonary distress. Afebrile. HEENT No facial trauma or asymetry. Sinuses non tender. EOMI, pupils equally reactive to light. External ears normal, tympanic membranes clear. Oropharynx moist, no exudate.Poor dentition Neck: supple, no adenopathy,JVD or thyromegaly.No bruits.  Chest: Clear to ascultation bilaterally.No crackles or wheezes. Non tender to palpation  Breast: No asymetry,no masses. No nipple discharge or inversion. No axillary or supraclavicular adenopathy  Cardiovascular system; Heart sounds normal,  S1 and  S2 ,no S3.  No murmur, or thrill. Apical beat not displaced Peripheral pulses normal.  Abdomen: Soft, non tender, no organomegaly or masses. No bruits. Bowel sounds normal. No guarding, tenderness or rebound.  Rectal:  Normal sphincter tone. No hemorrhoids or  masses. guaiac negative stool. Prostate smooth and firm    Musculoskeletal exam: Decreased  ROM of spine, hips , normal in  shoulders and knees. No deformity ,swelling or crepitus noted. No muscle wasting or atrophy.   Neurologic: Cranial nerves 2 to 12 intact. Power, tone ,sensation and reflexes normal throughout. No disturbance in gait. No tremor.  Skin:laceration to left forearm and left digit,no purulent drainage noted Pigmentation normal throughout  Psych; Normal mood and affect. Judgement and concentration normal   Assessment & Plan:  Annual physical exam Annual exam as documented. Counseling done  re healthy lifestyle involving commitment to 150 minutes exercise per week, heart healthy diet, and attaining healthy weight.The importance of adequate sleep also discussed. Regular  seat belt use and home safety, is also discussed. Changes in health habits are decided on by the patient with goals and time frames  set for achieving them. Immunization and cancer screening needs are specifically addressed at this visit.   Back pain with left-sided radiculopathy Uncontrolled.Toradol and depo medrol administered IM in the office ,  Need for Tdap vaccination Laceration to left forearm and left index finger, TdAP administered  Need for prophylactic vaccination and inoculation against influenza After obtaining informed consent, the vaccine is  administered by LPN.

## 2016-09-05 NOTE — Assessment & Plan Note (Addendum)
Laceration to left forearm and left index finger, TdAP administered

## 2016-09-05 NOTE — Patient Instructions (Addendum)
Welcome to medicare early January, call if you need me before  CONGRATS on improved blood sugar, keep it up  Reduce fat in diet and continue to lose weight , BP slightly higher than it should be  Flu vaccine and tdAp today  Fasting lipid, cmp and eGFr , hBA1C and tSH first week in January  Please work on good  health habits so that your health will improve. 1. Commitment to daily physical activity for 30 to 60  minutes, if you are able to do this.  2. Commitment to wise food choices. Aim for half of your  food intake to be vegetable and fruit, one quarter starchy foods, and one quarter protein. Try to eat on a regular schedule  3 meals per day, snacking between meals should be limited to vegetables or fruits or small portions of nuts. 64 ounces of water per day is generally recommended, unless you have specific health conditions, like heart failure or kidney failure where you will need to limit fluid intake.  3. Commitment to sufficient and a  good quality of physical and mental rest daily, generally between 6 to 8 hours per day.  WITH PERSISTANCE AND PERSEVERANCE, THE IMPOSSIBLE , BECOMES THE NORM!  Thank you  for choosing [facility]. We consider it a privelige to serve you.  Delivering excellent health care in a caring and  compassionate way is our goal.  Partnering with you,  so that together we can achieve this goal is our strategy.

## 2016-09-05 NOTE — Assessment & Plan Note (Signed)
After obtaining informed consent, the vaccine is  administered by LPN.  

## 2016-09-05 NOTE — Assessment & Plan Note (Signed)

## 2016-09-05 NOTE — Assessment & Plan Note (Signed)
Uncontrolled.Toradol and depo medrol administered IM in the office , 

## 2016-09-05 NOTE — Telephone Encounter (Signed)
Oxycodone-Acetaminophen 5/325mg   Qty 52 Tablets

## 2016-09-14 ENCOUNTER — Encounter: Payer: Medicaid Other | Admitting: Family Medicine

## 2016-09-18 ENCOUNTER — Other Ambulatory Visit: Payer: Self-pay | Admitting: *Deleted

## 2016-09-18 ENCOUNTER — Telehealth: Payer: Self-pay | Admitting: Orthopedic Surgery

## 2016-09-18 ENCOUNTER — Other Ambulatory Visit: Payer: Self-pay | Admitting: Family Medicine

## 2016-09-18 MED ORDER — OXYCODONE-ACETAMINOPHEN 5-325 MG PO TABS: 1.0000 | ORAL_TABLET | Freq: Four times a day (QID) | ORAL | 0 refills | Status: DC | PRN

## 2016-09-18 NOTE — Telephone Encounter (Signed)
Request for refill on Oxycodone/Acetaminophen (Percocet) 5-325 mgs.   Qty  48       Sig: Take 1 tablet by mouth every 6 (six) hours as needed for moderate pain or severe pain (Must last 14 days.Do not drive or operate machinery while taking this medicine).

## 2016-09-20 ENCOUNTER — Ambulatory Visit: Payer: Medicaid Other | Attending: Pulmonary Disease | Admitting: Pulmonary Disease

## 2016-09-20 DIAGNOSIS — G4733 Obstructive sleep apnea (adult) (pediatric): Secondary | ICD-10-CM | POA: Insufficient documentation

## 2016-09-27 ENCOUNTER — Telehealth: Payer: Self-pay | Admitting: Pulmonary Disease

## 2016-09-27 DIAGNOSIS — G4733 Obstructive sleep apnea (adult) (pediatric): Secondary | ICD-10-CM

## 2016-09-27 DIAGNOSIS — G4730 Sleep apnea, unspecified: Secondary | ICD-10-CM | POA: Diagnosis not present

## 2016-09-27 NOTE — Procedures (Signed)
Patient Name: Mark Walls, Mark Walls Date: 09/20/2016 Gender: Male D.O.B: 1956-06-05 Age (years): 51 Referring Provider: Cyril Mourning MD, ABSM Height (inches): 67 Interpreting Physician: Cyril Mourning MD, ABSM Weight (lbs): 202 RPSGT: Peak, Robert BMI: 32 MRN: [id number] Neck Size: 18.00   CLINICAL INFORMATION Sleep Study Type: Split Night CPAP Indication for sleep study: OSA Epworth Sleepiness Score: 6   SLEEP STUDY TECHNIQUE As per the AASM Manual for the Scoring of Sleep and Associated Events v2.3 (April 2016) with a hypopnea requiring 4% desaturations. The channels recorded and monitored were frontal, central and occipital EEG, electrooculogram (EOG), submentalis EMG (chin), nasal and oral airflow, thoracic and abdominal wall motion, anterior tibialis EMG, snore microphone, electrocardiogram, and pulse oximetry. Continuous positive airway pressure (CPAP) was initiated when the patient met split night criteria and was titrated according to treat sleep-disordered breathing.   RESPIRATORY PARAMETERS Diagnostic Total AHI (/hr): 15.6 RDI (/hr): 16.7 OA Index (/hr): 0.7 CA Index (/hr): 0.7 REM AHI (/hr): 13.3 NREM AHI (/hr): 15.8 Supine AHI (/hr): 57.9 Non-supine AHI (/hr): 1.42 Min O2 Sat (%): 86.00 Mean O2 (%): 93.37 Time below 88% (min): 1.0   Titration Optimal Pressure (cm): 8 AHI at Optimal Pressure (/hr): N/A Min O2 at Optimal Pressure (%): 89.00 Supine % at Optimal (%): N/A Sleep % at Optimal (%): N/A     SLEEP ARCHITECTURE The recording time for the entire night was 390.3 minutes. During a baseline period of 203.6 minutes, the patient slept for 169.0 minutes in REM and nonREM, yielding a sleep efficiency of 83.0%. Sleep onset after lights out was 19.5 minutes with a REM latency of 75.0 minutes. The patient spent 13.91% of the night in stage N1 sleep, 75.74% in stage N2 sleep, 5.03% in stage N3 and 5.33% in REM. During the titration period of 186.1 minutes, the patient slept for  144.5 minutes in REM and nonREM, yielding a sleep efficiency of 77.6%. Sleep onset after CPAP initiation was 1.2 minutes with a REM latency of 15.0 minutes. The patient spent 9.34% of the night in stage N1 sleep, 74.74% in stage N2 sleep, 0.00% in stage N3 and 15.92% in REM.   CARDIAC DATA The 2 lead EKG demonstrated sinus rhythm. The mean heart rate was 60.43 beats per minute. Other EKG findings include: None.   LEG MOVEMENT DATA The total Periodic Limb Movements of Sleep (PLMS) were 0. The PLMS index was 0.00 .   IMPRESSIONS - Moderate obstructive sleep apnea occurred during the diagnostic portion of the study(AHI = 15.6/hour). Events were primarily noted during supine sleep. An optimal PAP pressure was selected for this patient ( 8 cm of water) - No significant central sleep apnea occurred during the diagnostic portion of the study (CAI = 0.7/hour). - Mild oxygen desaturation was noted during the diagnostic portion of the study (Min O2 = 86.00%). - The patient snored with Moderate snoring volume during the diagnostic portion of the study. - No cardiac abnormalities were noted during this study. - Clinically significant periodic limb movements did not occur during sleep.   DIAGNOSIS - Obstructive Sleep Apnea (327.23 [G47.33 ICD-10])   RECOMMENDATIONS - Trial of CPAP therapy on 8 cm H2O with a Medium size Resmed Nasal Pillow Mask AirFit P10 mask and heated humidification. - Positional theapy can also be tried - Avoid alcohol, sedatives and other CNS depressants that may worsen sleep apnea and disrupt normal sleep architecture. - Sleep hygiene should be reviewed to assess factors that may improve sleep quality. - Weight management and  regular exercise should be initiated or continued. - Return to [facility] for re-evaluation after 4 weeks of therapy     Cyril Mourning MD Board Certified in Sleep medicine

## 2016-09-27 NOTE — Telephone Encounter (Signed)
Pl send Rx for   CPAP therapy on 8 cm H2O with a Medium size Resmed Nasal Pillow Mask AirFit P10 mask and heated humidification. donwload in 4 wks OV in 6

## 2016-09-28 NOTE — Telephone Encounter (Signed)
Spoke with pt, aware of recs.  cpap ordered.  Pt aware of order process for cpap.  Scheduled rov with RA.  Nothing further needed at this time.

## 2016-09-28 NOTE — Telephone Encounter (Signed)
Patient called and wanted to know when his cpap machine is going to be ordered. He can be reached at [phone]

## 2016-10-02 ENCOUNTER — Telehealth: Payer: Self-pay | Admitting: Orthopaedic Surgery

## 2016-10-02 NOTE — Telephone Encounter (Signed)
Oxycodone-Acetaminophen  5/325mg   Qty  48 Tablets

## 2016-10-03 MED ORDER — OXYCODONE-ACETAMINOPHEN 5-325 MG PO TABS: 1.0000 | ORAL_TABLET | Freq: Four times a day (QID) | ORAL | 0 refills | Status: DC | PRN

## 2016-10-17 ENCOUNTER — Encounter: Payer: Self-pay | Admitting: Orthopaedic Surgery

## 2016-10-17 ENCOUNTER — Ambulatory Visit (INDEPENDENT_AMBULATORY_CARE_PROVIDER_SITE_OTHER): Payer: Medicaid Other | Admitting: Orthopaedic Surgery

## 2016-10-17 VITALS — BP 153/79 | HR 68 | Temp 97.7°F | Ht 68.5 in | Wt 209.0 lb

## 2016-10-17 DIAGNOSIS — G8929 Other chronic pain: Secondary | ICD-10-CM

## 2016-10-17 DIAGNOSIS — I10 Essential (primary) hypertension: Secondary | ICD-10-CM

## 2016-10-17 DIAGNOSIS — J449 Chronic obstructive pulmonary disease, unspecified: Secondary | ICD-10-CM | POA: Diagnosis not present

## 2016-10-17 DIAGNOSIS — M545 Low back pain: Secondary | ICD-10-CM

## 2016-10-17 MED ORDER — OXYCODONE-ACETAMINOPHEN 5-325 MG PO TABS: 1.0000 | ORAL_TABLET | Freq: Four times a day (QID) | ORAL | 0 refills | Status: DC | PRN

## 2016-10-17 NOTE — Progress Notes (Signed)
Patient ID:Mark Walls, male DOB:[birth year], 60 y.o. MVH:[id number]  Chief Complaint  Patient presents with  . Follow-up    back pain    HPI  Mark Walls is a 60 y.o. male who has chronic lower back pain.  He had episode of increased pain several weeks ago and went to the ER.  He had CT scan of his back. He has no acute injury.  He is taking his medicine.  He has no sciatica.  He has no trauma.  He has no weakness.  He is doing his exercises. HPI  Body mass index is 31.32 kg/m.  ROS  Review of Systems  Constitutional: Positive for fatigue.  HENT: Negative for congestion.   Respiratory: Negative for cough and shortness of breath.   Cardiovascular: Negative for chest pain and leg swelling.  Endocrine: Positive for cold intolerance and polyuria.  Musculoskeletal: Positive for arthralgias, back pain and myalgias.  Allergic/Immunologic: Positive for environmental allergies.  Psychiatric/Behavioral: Positive for sleep disturbance. The patient is nervous/anxious.     Past Medical History:  Diagnosis Date  . Anxiety   . Chronic fatigue   . Depression   . Diabetes mellitus, type 2 (HCC)   . ED (erectile dysfunction)   . Hyperlipidemia   . Hypertension   . Nicotine addiction   . Obesity   . PVD (peripheral vascular disease) (HCC)     Past Surgical History:  Procedure Laterality Date  . CHOLECYSTECTOMY    . COLONOSCOPY  10/01/2002   [name]:small internal hemorrhoids/single small sessile polyp in the rectum/ otherwise normal. Hyperplastic polyp.  . COLONOSCOPY WITH ESOPHAGOGASTRODUODENOSCOPY (EGD) N/A 08/19/2013   WUX:[id number] EG junction. Hiatal hernia. Abnormal gastric mucosa s/p bx: Internal hemorrhoids;  Otherwise, normal ileocolonoscopy - Status post segmental biopsy  . ESOPHAGOGASTRODUODENOSCOPY  09/24/2002   [name]:gastritis of the antrum and the bodt of the stomach otherwise normal  . left hand    . left knee athroscopy  03/2008  . polpectomy colon    . right common  iliac artery stenting    . right elbow    . ROTATOR CUFF REPAIR  09/01/08    Family History  Problem Relation Age of Onset  . Cirrhosis Mother   . Melanoma Mother   . Mental illness Father     suicide   . Mental illness Brother   . Diabetes Brother   . Hypertension Brother   . Diabetes Brother   . Hypertension Brother   . Mental illness Brother   . Melanoma Brother   . Colon cancer Paternal Grandfather     age 85    Social History Social History  Substance Use Topics  . Smoking status: Former Smoker    Packs/day: 1.00    Years: 40.00    Types: Cigarettes    Start date: 09/16/1970    Quit date: 11/17/2013  . Smokeless tobacco: Never Used  . Alcohol use No    Allergies  Allergen Reactions  . Allopurinol Nausea And Vomiting  . Levofloxacin Nausea And Vomiting  . Statins Other (See Comments)    Fatty liver, markedly elevated LFT  . Sulfamethoxazole-Trimethoprim Nausea And Vomiting  . Codeine Rash    Current Outpatient Prescriptions  Medication Sig Dispense Refill  . ACCU-CHEK AVIVA PLUS test strip TEST DAILY 50 each 5  . albuterol (PROVENTIL HFA;VENTOLIN HFA) 108 (90 BASE) MCG/ACT inhaler Inhale 2 puffs into the lungs every 6 (six) hours as needed for wheezing or shortness of breath. 1 Inhaler 2  .  ALPRAZolam (XANAX) 1 MG tablet Take 1 mg by mouth 4 (four) times daily as needed for anxiety.    Marland Kitchen amphetamine-dextroamphetamine (ADDERALL) 10 MG tablet Take 10 mg by mouth 3 (three) times daily.     Marland Kitchen aspirin 81 MG tablet Take 81 mg by mouth daily.    Marland Kitchen azelastine (ASTELIN) 0.1 % nasal spray Place 2 sprays into both nostrils 2 (two) times daily. Use in each nostril as directed 30 mL 12  . Blood Glucose Monitoring Suppl (ACCU-CHEK AVIVA PLUS) w/Device KIT 1 kit for daily testing dx e11.9 1 kit 0  . fluticasone (FLONASE) 50 MCG/ACT nasal spray Place 2 sprays into both nostrils daily. 16 g 6  . gabapentin (NEURONTIN) 300 MG capsule Take 1 capsule (300 mg total) by mouth 2  (two) times daily. 60 capsule 3  . glipiZIDE (GLUCOTROL XL) 10 MG 24 hr tablet TAKE (1) TABLET ONCE DAILY WITH BREAKFAST. 30 tablet 5  . losartan (COZAAR) 50 MG tablet Take 1 tablet (50 mg total) by mouth daily. 30 tablet 4  . metoprolol tartrate (LOPRESSOR) 25 MG tablet Take 0.5 tablets (12.5 mg total) by mouth 2 (two) times daily. 90 tablet 3  . montelukast (SINGULAIR) 10 MG tablet TAKE 1 TABLET BY MOUTH AT BEDTIME. 30 tablet 5  . oxyCODONE-acetaminophen (PERCOCET/ROXICET) 5-325 MG tablet Take 1 tablet by mouth every 6 (six) hours as needed for moderate pain or severe pain (Must last 14 days.Do not drive or operate machinery while taking this medicine). 40 tablet 0  . predniSONE (STERAPRED UNI-PAK 21 TAB) 5 MG (21) TBPK tablet As directed on package 21 tablet 0  . promethazine-dextromethorphan (PROMETHAZINE-DM) 6.25-15 MG/5ML syrup Take 5 mLs by mouth at bedtime as needed for cough. 118 mL 0  . sitaGLIPtin-metformin (JANUMET) 50-1000 MG per tablet Take 1 tablet by mouth 2 (two) times daily with a meal.    . SYMBICORT 80-4.5 MCG/ACT inhaler INHALE 2 PUFFS TWICE DAILY. 10.2 g 1  . vardenafil (LEVITRA) 20 MG tablet Take 20 mg by mouth daily as needed.      Marland Kitchen ZETIA 10 MG tablet TAKE (1) TABLET BY MOUTH ONCE DAILY. 30 tablet 3   No current facility-administered medications for this visit.      Physical Exam  Blood pressure (!) 153/79, pulse 68, temperature 97.7 F (36.5 C), height 5' 8.5" (1.74 m), weight 209 lb (94.8 kg).  Constitutional: overall normal hygiene, normal nutrition, well developed, normal grooming, normal body habitus. Assistive device:cane  Musculoskeletal: gait and station Limp left, muscle tone and strength are normal, no tremors or atrophy is present.  .  Neurological: coordination overall normal.  Deep tendon reflex/nerve stretch intact.  Sensation normal.  Cranial nerves II-XII intact.   Skin:   Normal overall no scars, lesions, ulcers or rashes. No  psoriasis.  Psychiatric: Alert and oriented x 3.  Recent memory intact, remote memory unclear.  Normal mood and affect. Well groomed.  Good eye contact.  Cardiovascular: overall no swelling, no varicosities, no edema bilaterally, normal temperatures of the legs and arms, no clubbing, cyanosis and good capillary refill.  Lymphatic: palpation is normal.  Spine/Pelvis examination:  Inspection:  Overall, sacoiliac joint benign and hips nontender; without crepitus or defects.   Thoracic spine inspection: Alignment normal without kyphosis present   Lumbar spine inspection:  Alignment  with normal lumbar lordosis, without scoliosis apparent.   Thoracic spine palpation:  without tenderness of spinal processes   Lumbar spine palpation: with tenderness of lumbar area; without  tightness of lumbar muscles    Range of Motion:   Lumbar flexion, forward flexion is 35 without pain or tenderness    Lumbar extension is 5 without pain or tenderness   Left lateral bend is Normal  without pain or tenderness   Right lateral bend is Normal without pain or tenderness   Straight leg raising is Normal   Strength & tone: Normal   Stability overall normal stability     The patient has been educated about the nature of the problem(s) and counseled on treatment options.  The patient appeared to understand what I have discussed and is in agreement with it.  Encounter Diagnoses  Name Primary?  . Chronic midline low back pain without sciatica Yes  . Chronic obstructive pulmonary disease, unspecified COPD type (HCC)   . Essential hypertension     PLAN Call if any problems.  Precautions discussed.  Continue current medications.   Return to clinic 3 months   Electronically Signed Darreld Mclean, MD 10/31/20179:28 AM

## 2016-10-17 NOTE — Progress Notes (Signed)
f °

## 2016-10-18 ENCOUNTER — Other Ambulatory Visit: Payer: Self-pay | Admitting: Family Medicine

## 2016-10-30 ENCOUNTER — Ambulatory Visit: Payer: Medicaid Other | Admitting: Pulmonary Disease

## 2016-10-31 ENCOUNTER — Other Ambulatory Visit: Payer: Self-pay | Admitting: *Deleted

## 2016-10-31 ENCOUNTER — Telehealth: Payer: Self-pay | Admitting: Orthopaedic Surgery

## 2016-10-31 MED ORDER — OXYCODONE-ACETAMINOPHEN 5-325 MG PO TABS: 1.0000 | ORAL_TABLET | Freq: Four times a day (QID) | ORAL | 0 refills | Status: DC | PRN

## 2016-10-31 NOTE — Telephone Encounter (Signed)
Patient of Dr. Brooke Bonito requests refill on Oxycodone/Acetaminophen (Percocet)  5-325 mgs.   Qty  40   Sig: Take 1 tablet by mouth every 6 (six) hours as needed for moderate pain or severe pain (Must last 14 days.Do not drive or operate machinery while taking this medicine).

## 2016-11-13 ENCOUNTER — Telehealth: Payer: Self-pay | Admitting: Orthopaedic Surgery

## 2016-11-13 NOTE — Telephone Encounter (Signed)
Oxycodone-Acetaminophen  5/325mg   Qty 40 Tablets

## 2016-11-14 ENCOUNTER — Other Ambulatory Visit: Payer: Self-pay

## 2016-11-14 MED ORDER — OXYCODONE-ACETAMINOPHEN 5-325 MG PO TABS: 1.0000 | ORAL_TABLET | Freq: Four times a day (QID) | ORAL | 0 refills | Status: DC | PRN

## 2016-11-14 MED ORDER — CLOTRIMAZOLE-BETAMETHASONE 1-0.05 % EX LOTN
1.0000 | TOPICAL_LOTION | Freq: Two times a day (BID) | CUTANEOUS | 0 refills | Status: DC
Start: 2016-11-14 — End: 2019-02-25

## 2016-11-27 ENCOUNTER — Telehealth: Payer: Self-pay | Admitting: Orthopaedic Surgery

## 2016-11-27 MED ORDER — OXYCODONE-ACETAMINOPHEN 5-325 MG PO TABS: 1.0000 | ORAL_TABLET | Freq: Four times a day (QID) | ORAL | 0 refills | Status: DC | PRN

## 2016-11-27 NOTE — Telephone Encounter (Signed)
Oxycodone-Acetaminophen  5/325mg   Qty 35 Tablets

## 2016-12-05 ENCOUNTER — Telehealth: Payer: Self-pay | Admitting: Orthopaedic Surgery

## 2016-12-05 NOTE — Telephone Encounter (Signed)
Oxycodone-Acetaminophen  5/325mg   Qty 30 Tablets

## 2016-12-06 MED ORDER — OXYCODONE-ACETAMINOPHEN 5-325 MG PO TABS: 1.0000 | ORAL_TABLET | Freq: Four times a day (QID) | ORAL | 0 refills | Status: DC | PRN

## 2016-12-19 ENCOUNTER — Other Ambulatory Visit: Payer: Self-pay | Admitting: Family Medicine

## 2016-12-19 DIAGNOSIS — J3089 Other allergic rhinitis: Secondary | ICD-10-CM

## 2016-12-21 ENCOUNTER — Telehealth: Payer: Self-pay | Admitting: Orthopaedic Surgery

## 2016-12-21 MED ORDER — OXYCODONE-ACETAMINOPHEN 5-325 MG PO TABS: ORAL_TABLET | ORAL | 0 refills | Status: DC

## 2016-12-21 NOTE — Telephone Encounter (Signed)
Oxycodone-Acetaminophen  5/325mg   Qty 25  Tablets   Pt is waiting.

## 2016-12-27 ENCOUNTER — Encounter: Payer: Self-pay | Admitting: Family Medicine

## 2016-12-27 ENCOUNTER — Ambulatory Visit (INDEPENDENT_AMBULATORY_CARE_PROVIDER_SITE_OTHER): Payer: Medicaid Other | Admitting: Family Medicine

## 2016-12-27 ENCOUNTER — Inpatient Hospital Stay (HOSPITAL_COMMUNITY): Admission: RE | Admit: 2016-12-27 | Payer: Self-pay

## 2016-12-27 VITALS — BP 130/72 | HR 71 | Resp 16 | Ht 69.0 in | Wt 210.0 lb

## 2016-12-27 DIAGNOSIS — E669 Obesity, unspecified: Secondary | ICD-10-CM

## 2016-12-27 DIAGNOSIS — Z79891 Long term (current) use of opiate analgesic: Secondary | ICD-10-CM

## 2016-12-27 DIAGNOSIS — F329 Major depressive disorder, single episode, unspecified: Secondary | ICD-10-CM

## 2016-12-27 DIAGNOSIS — E1169 Type 2 diabetes mellitus with other specified complication: Secondary | ICD-10-CM

## 2016-12-27 DIAGNOSIS — I10 Essential (primary) hypertension: Secondary | ICD-10-CM

## 2016-12-27 DIAGNOSIS — E66811 Obesity, class 1: Secondary | ICD-10-CM

## 2016-12-27 DIAGNOSIS — D229 Melanocytic nevi, unspecified: Secondary | ICD-10-CM

## 2016-12-27 DIAGNOSIS — F32A Depression, unspecified: Secondary | ICD-10-CM

## 2016-12-27 DIAGNOSIS — G4733 Obstructive sleep apnea (adult) (pediatric): Secondary | ICD-10-CM

## 2016-12-27 DIAGNOSIS — M5410 Radiculopathy, site unspecified: Secondary | ICD-10-CM

## 2016-12-27 DIAGNOSIS — G894 Chronic pain syndrome: Secondary | ICD-10-CM

## 2016-12-27 DIAGNOSIS — F411 Generalized anxiety disorder: Secondary | ICD-10-CM

## 2016-12-27 DIAGNOSIS — E785 Hyperlipidemia, unspecified: Secondary | ICD-10-CM

## 2016-12-27 LAB — HEMOGLOBIN A1C
Hgb A1c MFr Bld: 7 % — ABNORMAL HIGH (ref <5.7)
MEAN PLASMA GLUCOSE: 154 mg/dL

## 2016-12-27 MED ORDER — GABAPENTIN 300 MG PO CAPS: 300.0000 mg | ORAL_CAPSULE | Freq: Two times a day (BID) | ORAL | 3 refills | Status: DC

## 2016-12-27 MED ORDER — KETOROLAC TROMETHAMINE 60 MG/2ML IM SOLN
60.0000 mg | Freq: Once | INTRAMUSCULAR | Status: AC
  Administered 2016-12-27: 60 mg via INTRAMUSCULAR

## 2016-12-27 MED ORDER — METHYLPREDNISOLONE ACETATE 80 MG/ML IJ SUSP
80.0000 mg | Freq: Once | INTRAMUSCULAR | Status: AC
  Administered 2016-12-27: 80 mg via INTRAMUSCULAR

## 2016-12-27 MED ORDER — MELOXICAM 15 MG PO TABS: 15.0000 mg | ORAL_TABLET | Freq: Every day | ORAL | 2 refills | Status: DC

## 2016-12-27 MED ORDER — OXYCODONE-ACETAMINOPHEN 5-325 MG PO TABS: ORAL_TABLET | ORAL | 0 refills | Status: DC

## 2016-12-27 NOTE — Patient Instructions (Signed)
Welcome to medicare in 3 month, call if you need me sooner  You are referred to pain clinic for chronic pain management and iu will be in touch with Dr Luna Glasgow about this also  Urine screen today  START GABAPENTIN one twice daily and MELOXICAM one daily for chronic pain management   One moth ONLY of oxycodone one twice daily prescribed today  Injections for back pain today

## 2016-12-27 NOTE — Progress Notes (Signed)
Mark Walls     MRN: [id number]      DOB: [birth year]   HPI Mark Walls is here for follow up and re-evaluation of chronic medical conditions, medication management and review of any available recent lab and radiology data.  Preventive health is updated, specifically  Cancer screening and Immunization.   Questions or concerns regarding consultations or procedures which the PT has had in the interim are  Addressed .Reports need for ongoing pain management as he continues to have pain in back, shoulders, hands and knees and ortho will no longer treat Sees psych but reports that his mood is up and down, not suicidal or homicidal but feels inadequately stable as pain management is sub optimal Has not been taking gabapentin as prescribed , understands the need to commit to non opioid chronic pain management , as well as the fact that , based on his co morbidities and the need to be maintained on high doses of xanax, that he will need to establish in a pain clinic for chronic management C/o bilateral hand pain and swelling Denies polyuria, polydipsia, blurred vision , or hypoglycemic episodes. C/o pigmented lesion on skin on scalp wants this checked  ROS Denies recent fever or chills. Denies sinus pressure, nasal congestion, ear pain or sore throat. Denies chest congestion, productive cough or wheezing. Denies chest pains, palpitations and leg swelling Denies abdominal pain, nausea, vomiting,diarrhea or constipation.   Denies dysuria, frequency, hesitancy or incontinence.    PE  BP 130/72   Pulse 71   Resp 16   Ht 5\' 9"  (1.753 m)   Wt 210 lb (95.3 kg)   SpO2 96%   BMI 31.01 kg/m   Patient alert and oriented and in no cardiopulmonary distress.  HEENT: No facial asymmetry, EOMI,   oropharynx pink and moist.  Neck supple no JVD, no mass.  Chest: Clear to auscultation bilaterally.  CVS: S1, S2 no murmurs, no S3.Regular rate.  ABD: Soft non tender.   Ext: No edema  MS:  decreased  ROM spine, shoulders, hips and knees.bilateral swelling of joints in hands  Skin: Intact,  lesion noted on scalp  Psych: Good eye contact, normal affect. Memory intact not anxious or depressed appearing.  CNS: CN 2-12 intact, power,  normal throughout.no focal deficits noted.   Assessment & Plan  Essential hypertension Controlled, no change in medication DASH diet and commitment to daily physical activity for a minimum of 30 minutes discussed and encouraged, as a part of hypertension management. The importance of attaining a healthy weight is also discussed.  BP/Weight 12/27/2016 10/17/2016 09/20/2016 09/05/2016 08/15/2016 06/15/2016 05/04/2016  Systolic BP 130 153 - 140 136 621 150  Diastolic BP 72 79 - 70 68 69 67  Wt. (Lbs) 210 209 202 207 202 201 208  BMI 31.01 31.32 31.64 31.94 31.17 31 32.57       Back pain with left-sided radiculopathy Chronic ongoing pain, needs to be treated by pain clinic. Reduced dose of oxycodone written short term and letter sent to orthopedic Doc so he is aware of the situation Pt educated re the need to comply  With recommendations for managing his chronic pain in the interim, he will start gaba[pentin twice daily ad meloxicam  toradol and depomedrol  administered  Diabetes mellitus type 2 in obese Mark Walls) Mark Walls is reminded of the importance of commitment to daily physical activity for 30 minutes or more, as able and the need to limit carbohydrate intake to 30 to  60 grams per meal to help with blood sugar control.   The need to take medication as prescribed, test blood sugar as directed, and to call between visits if there is a concern that blood sugar is uncontrolled is also discussed.   Mark Walls is reminded of the importance of daily foot exam, annual eye examination, and good blood sugar, blood pressure and cholesterol control.  Diabetic Labs Latest Ref Rng & Units 07/28/2016 04/19/2016 12/14/2015 01/27/2015 10/28/2014  HbA1c <5.7 %  6.0(H) - 7.7(H) 7.6(H) -  Microalbumin Not estab mg/dL 1.0 1.0 - - 0.6  Micro/Creat Ratio <30 mcg/mg creat 11 4 - - 3.4  Chol 125 - 200 mg/dL 161(W) - 960(A) 540(J) -  HDL >=40 mg/dL 45 - 81(X) 91(Y) -  Calc LDL <130 mg/dL 782(N) - 562(Z) 308(M) -  Triglycerides <150 mg/dL 578 - 469(G) 295(M) -  Creatinine 0.70 - 1.25 mg/dL 8.41 - 3.24 4.01 -   BP/Weight 12/27/2016 10/17/2016 09/20/2016 09/05/2016 08/15/2016 06/15/2016 05/04/2016  Systolic BP 130 153 - 140 136 027 150  Diastolic BP 72 79 - 70 68 69 67  Wt. (Lbs) 210 209 202 207 202 201 208  BMI 31.01 31.32 31.64 31.94 31.17 31 32.57   Foot/eye exam completion dates Latest Ref Rng & Units 12/27/2016 07/11/2016  Eye Exam No Retinopathy - No Retinopathy  Foot Form Completion - Done -   Updated lab shows deterioration, but adequate control, no med change      GAD (generalized anxiety disorder) Managed by psych on chronic xanax  Hyperlipidemia LDL goal <100 Hyperlipidemia:Low fat diet discussed and encouraged.also medication modification needed   Lipid Panel  Lab Results  Component Value Date   CHOL 246 (H) 12/27/2016   HDL 37 (L) 12/27/2016   LDLCALC 172 (H) 12/27/2016   TRIG 184 (H) 12/27/2016   CHOLHDL 6.6 (H) 12/27/2016   Uncontrolled will need to dramatically change diet    Depression Psychiatry to treat, states having mood swings a lot of which he attributes to poor pain control  OSA (obstructive sleep apnea) Compliance with CPAP stressed  Obesity (BMI 30.0-34.9) Deteriorated. Patient re-educated about  the importance of commitment to a  minimum of 150 minutes of exercise per week.  The importance of healthy food choices with portion control discussed. Encouraged to start a food diary, count calories and to consider  joining a support group. Sample diet sheets offered. Goals set by the patient for the next several months.   Weight /BMI 12/27/2016 10/17/2016 09/20/2016  WEIGHT 210 lb 209 lb 202 lb  HEIGHT 5\' 9"  5'  8.5" 5\' 7"   BMI 31.01 kg/m2 31.32 kg/m2 31.64 kg/m2      Chronic pain syndrome Generalized  Chronic pain involving hands, shoulders, back, knees , hips. Needs to commit to treatment plan involving non narc management, also is referred to Pain clinic as based on high risk profile has required and will require this  One month of oxycodone prescribed at lower dose than he has been getting in recent past and orthopedic Doc also notified of current plan  Atypical mole Concerning to pt and present on scalp, derm to eval and manage

## 2016-12-27 NOTE — Assessment & Plan Note (Signed)
Chronic ongoing pain, needs to be treated by pain clinic. Reduced dose of oxycodone written short term and letter sent to orthopedic Doc so he is aware of the situation Pt educated re the need to comply  With recommendations for managing his chronic pain in the interim, he will start gaba[pentin twice daily ad meloxicam  toradol and depomedrol  administered

## 2016-12-27 NOTE — Assessment & Plan Note (Signed)
Controlled, no change in medication DASH diet and commitment to daily physical activity for a minimum of 30 minutes discussed and encouraged, as a part of hypertension management. The importance of attaining a healthy weight is also discussed.  BP/Weight 12/27/2016 10/17/2016 09/20/2016 09/05/2016 08/15/2016 06/15/2016 [id number]  Systolic BP [id number] 0000000 - [id number] [id number] [id number]  Diastolic BP 72 79 - 70 68 69 67  Wt. (Lbs) 210 209 202 207 202 201 208  BMI 31.01 31.32 31.64 31.94 31.17 31 32.57

## 2016-12-27 NOTE — Progress Notes (Signed)
3

## 2016-12-27 NOTE — Assessment & Plan Note (Addendum)
Mark Walls is reminded of the importance of commitment to daily physical activity for 30 minutes or more, as able and the need to limit carbohydrate intake to 30 to 60 grams per meal to help with blood sugar control.   The need to take medication as prescribed, test blood sugar as directed, and to call between visits if there is a concern that blood sugar is uncontrolled is also discussed.   Mark Walls is reminded of the importance of daily foot exam, annual eye examination, and good blood sugar, blood pressure and cholesterol control.  Diabetic Labs Latest Ref Rng & Units 07/28/2016 04/19/2016 12/14/2015 01/27/2015 10/28/2014  HbA1c <5.7 % 6.0(H) - 7.7(H) 7.6(H) -  Microalbumin Not estab mg/dL 1.0 1.0 - - 0.6  Micro/Creat Ratio <30 mcg/mg creat 11 4 - - 3.4  Chol 125 - 200 mg/dL 208(H) - 219(H) 212(H) -  HDL >=40 mg/dL 45 - 34(L) 33(L) -  Calc LDL <130 mg/dL 139(H) - 139(H) 128(H) -  Triglycerides <150 mg/dL 119 - 229(H) 256(H) -  Creatinine 0.70 - 1.25 mg/dL 0.89 - 0.88 0.87 -   BP/Weight 12/27/2016 10/17/2016 09/20/2016 09/05/2016 08/15/2016 06/15/2016 [id number]  Systolic BP [id number] 0000000 - [id number] [id number] [id number]  Diastolic BP 72 79 - 70 68 69 67  Wt. (Lbs) 210 209 202 207 202 201 208  BMI 31.01 31.32 31.64 31.94 31.17 31 32.57   Foot/eye exam completion dates Latest Ref Rng & Units 12/27/2016 07/11/2016  Eye Exam No Retinopathy - No Retinopathy  Foot Form Completion - Done -   Updated lab shows deterioration, but adequate control, no med change

## 2016-12-28 DIAGNOSIS — D229 Melanocytic nevi, unspecified: Secondary | ICD-10-CM | POA: Insufficient documentation

## 2016-12-28 DIAGNOSIS — G894 Chronic pain syndrome: Secondary | ICD-10-CM | POA: Insufficient documentation

## 2016-12-28 LAB — LIPID PANEL
CHOLESTEROL: 246 mg/dL — AB (ref <200)
HDL: 37 mg/dL — ABNORMAL LOW (ref >40)
LDL CALC: 172 mg/dL — AB (ref <100)
TRIGLYCERIDES: 184 mg/dL — AB (ref <150)
Total CHOL/HDL Ratio: 6.6 Ratio — ABNORMAL HIGH (ref <5.0)
VLDL: 37 mg/dL — ABNORMAL HIGH (ref <30)

## 2016-12-28 LAB — COMPLETE METABOLIC PANEL WITH GFR
ALBUMIN: 4.2 g/dL (ref 3.6–5.1)
ALK PHOS: 89 U/L (ref 40–115)
ALT: 85 U/L — AB (ref 9–46)
AST: 36 U/L — AB (ref 10–35)
BILIRUBIN TOTAL: 0.7 mg/dL (ref 0.2–1.2)
BUN: 18 mg/dL (ref 7–25)
CO2: 28 mmol/L (ref 20–31)
CREATININE: 1.04 mg/dL (ref 0.70–1.25)
Calcium: 10.1 mg/dL (ref 8.6–10.3)
Chloride: 102 mmol/L (ref 98–110)
GFR, Est African American: 89 mL/min (ref >=60)
GFR, Est Non African American: 77 mL/min (ref >=60)
GLUCOSE: 168 mg/dL — AB (ref 65–99)
Potassium: 5.2 mmol/L (ref 3.5–5.3)
SODIUM: 138 mmol/L (ref 135–146)
TOTAL PROTEIN: 7.2 g/dL (ref 6.1–8.1)

## 2016-12-28 LAB — TSH: TSH: 3.49 mIU/L (ref 0.40–4.50)

## 2016-12-28 NOTE — Assessment & Plan Note (Signed)
Compliance with CPAP stressed

## 2016-12-28 NOTE — Assessment & Plan Note (Signed)
Concerning to pt and present on scalp, derm to eval and manage

## 2016-12-28 NOTE — Assessment & Plan Note (Signed)
Generalized  Chronic pain involving hands, shoulders, back, knees , hips. Needs to commit to treatment plan involving non narc management, also is referred to Pain clinic as based on high risk profile has required and will require this  One month of oxycodone prescribed at lower dose than he has been getting in recent past and orthopedic Doc also notified of current plan

## 2016-12-28 NOTE — Assessment & Plan Note (Signed)
Managed by psych on chronic xanax

## 2016-12-28 NOTE — Assessment & Plan Note (Signed)
Psychiatry to treat, states having mood swings a lot of which he attributes to poor pain control

## 2016-12-28 NOTE — Assessment & Plan Note (Signed)
Deteriorated. Patient re-educated about  the importance of commitment to a  minimum of 150 minutes of exercise per week.  The importance of healthy food choices with portion control discussed. Encouraged to start a food diary, count calories and to consider  joining a support group. Sample diet sheets offered. Goals set by the patient for the next several months.   Weight /BMI 12/27/2016 10/17/2016 09/20/2016  WEIGHT 210 lb 209 lb 202 lb  HEIGHT 5\' 9"  5' 8.5" 5\' 7"   BMI 31.01 kg/m2 31.32 kg/m2 31.64 kg/m2

## 2016-12-28 NOTE — Assessment & Plan Note (Addendum)
Hyperlipidemia:Low fat diet discussed and encouraged.also medication modification needed   Lipid Panel  Lab Results  Component Value Date   CHOL 246 (H) 12/27/2016   HDL 37 (L) 12/27/2016   LDLCALC 172 (H) 12/27/2016   TRIG 184 (H) 12/27/2016   CHOLHDL 6.6 (H) 12/27/2016   Uncontrolled will need to dramatically change diet

## 2016-12-29 LAB — PMP SCREEN PROFILE (10S), URINE
AMPHETAMINE SCRN UR: POSITIVE ng/mL
BARBITURATE SCRN UR: NEGATIVE ng/mL
Benzodiazepine Screen, Urine: NEGATIVE ng/mL
COCAINE(METAB.) SCREEN, URINE: NEGATIVE ng/mL
CREATININE(CRT), U: 263.8 mg/dL (ref 20.0–300.0)
Cannabinoids Ur Ql Scn: NEGATIVE ng/mL
METHADONE SCREEN, URINE: NEGATIVE ng/mL
OPIATE SCRN UR: POSITIVE ng/mL
OXYCODONE+OXYMORPHONE UR QL SCN: POSITIVE ng/mL
PCP SCRN UR: NEGATIVE ng/mL
Ph of Urine: 5.4 (ref 4.5–8.9)
Propoxyphene, Screen: NEGATIVE ng/mL

## 2017-01-02 ENCOUNTER — Telehealth: Payer: Self-pay | Admitting: Orthopaedic Surgery

## 2017-01-02 ENCOUNTER — Telehealth: Payer: Self-pay | Admitting: Family Medicine

## 2017-01-02 ENCOUNTER — Other Ambulatory Visit: Payer: Self-pay

## 2017-01-02 MED ORDER — OXYCODONE-ACETAMINOPHEN 5-325 MG PO TABS: ORAL_TABLET | ORAL | 0 refills | Status: DC

## 2017-01-02 NOTE — Telephone Encounter (Signed)
Please advise.  Due to Stop Act patient only received quantity #14 on 1/10

## 2017-01-02 NOTE — Telephone Encounter (Signed)
Patient called for refill:  oxyCODONE-acetaminophen (ROXICET) 5-325 MG tablet 60 tablet

## 2017-01-02 NOTE — Telephone Encounter (Signed)
pls note the pain appt is April 10, 2017. He can only collect 7 day supplies till that appointment. He will need to sign a pain contract with me and collect the 7 day scripts on a monthly basis till April appt, will follow thorough with you on this. He collected 1 week supply today

## 2017-01-02 NOTE — Telephone Encounter (Signed)
There is faxed note from his primary care they have given him Rx for this and referred to pain clinic.

## 2017-01-02 NOTE — Telephone Encounter (Signed)
Courtney please call Mark Walls in regards to his pain medication, please advise?

## 2017-01-05 NOTE — Telephone Encounter (Signed)
Four  sevn day scripts per month, thanks until his April appt, he will collect four scripts next week

## 2017-01-05 NOTE — Telephone Encounter (Signed)
Please clarify- 4 seven day scripts per month or weekly??

## 2017-01-09 ENCOUNTER — Other Ambulatory Visit: Payer: Self-pay

## 2017-01-09 MED ORDER — OXYCODONE-ACETAMINOPHEN 5-325 MG PO TABS: ORAL_TABLET | ORAL | 0 refills | Status: DC

## 2017-01-10 ENCOUNTER — Other Ambulatory Visit: Payer: Self-pay | Admitting: Family Medicine

## 2017-01-10 MED ORDER — PITAVASTATIN CALCIUM 2 MG PO TABS: 1.0000 | ORAL_TABLET | Freq: Every day | ORAL | 4 refills | Status: DC

## 2017-01-12 ENCOUNTER — Telehealth: Payer: Self-pay | Admitting: Family Medicine

## 2017-01-17 ENCOUNTER — Ambulatory Visit: Payer: Medicaid Other | Admitting: Orthopaedic Surgery

## 2017-01-23 ENCOUNTER — Ambulatory Visit: Payer: Medicaid Other | Admitting: Pulmonary Disease

## 2017-02-05 ENCOUNTER — Other Ambulatory Visit: Payer: Self-pay

## 2017-02-05 MED ORDER — OXYCODONE-ACETAMINOPHEN 5-325 MG PO TABS: ORAL_TABLET | ORAL | 0 refills | Status: DC

## 2017-02-07 ENCOUNTER — Ambulatory Visit: Payer: Medicaid Other | Admitting: Family Medicine

## 2017-02-12 ENCOUNTER — Other Ambulatory Visit: Payer: Self-pay

## 2017-02-12 ENCOUNTER — Ambulatory Visit (INDEPENDENT_AMBULATORY_CARE_PROVIDER_SITE_OTHER): Payer: Medicaid Other | Admitting: Family Medicine

## 2017-02-12 ENCOUNTER — Encounter: Payer: Self-pay | Admitting: Family Medicine

## 2017-02-12 VITALS — BP 120/80 | HR 67 | Resp 14 | Ht 69.0 in | Wt 215.0 lb

## 2017-02-12 DIAGNOSIS — I10 Essential (primary) hypertension: Secondary | ICD-10-CM | POA: Diagnosis not present

## 2017-02-12 DIAGNOSIS — G8929 Other chronic pain: Secondary | ICD-10-CM | POA: Diagnosis not present

## 2017-02-12 MED ORDER — OXYCODONE-ACETAMINOPHEN 5-325 MG PO TABS: ORAL_TABLET | ORAL | 0 refills | Status: DC

## 2017-02-12 NOTE — Assessment & Plan Note (Signed)
Pain contract reviewed and signed. Pt will need to get pain medication from me until his appointment in [place] on 4/24, he is awrae to call and verify the appt asap 4 weeks of scripts handed , one week each, at the visit

## 2017-02-12 NOTE — Patient Instructions (Addendum)
Cancel April 24 appt here, we will call / mail a reschedule here  You have an appointment in [place] on 4/24 at 12:30 pmn with Dr Donovan Kail at [facility] for pain management, vital that you keep this appointment. Please verify asap  Pain contract as discussed and signes until April 24  Needs f/u with fasting lipid, cmp and eGFR, hBA1C and cBC first week in May

## 2017-02-12 NOTE — Progress Notes (Signed)
Mark Walls     MRN: [id number]      DOB: 1956-12-18   HPI Mark Walls is here for chronic pain management Still c/o uncontrolled generalized pain, his appt with pain clinic is April 24. Contract reviewed and signed. Four weeks of once weekly medications given, he will need to collect an additional approx 8 weeks total, given in 4 week intervals   ROS See HPI Denies recent fever or chills. Denies sinus pressure, nasal congestion, ear pain or sore throat. Denies chest congestion, productive cough or wheezing. Denies chest pains, palpitations and leg swelling  . Denies skin break down or rash.   PE  BP 120/80   Pulse 67   Resp 14   Wt 215 lb (97.5 kg)   SpO2 95%   BMI 31.75 kg/m   Patient alert and oriented and in no cardiopulmonary distress.  HEENT: No facial asymmetry, EOMI,   oropharynx pink and moist.  Neck supple no JVD, no mass.  Chest: Clear to auscultation bilaterally.  CVS: S1, S2 no murmurs, no S3.Regular rate.  ABD: Soft non tender.   Ext: No edema  MS: decreased rOM spine, hps and  knees  Skin: Intact, no ulcerations or rash noted.  Psych: Good eye contact, normal affect. Memory intact not anxious or depressed appearing.  CNS: CN 2-12 intact, power,  normal throughout.no focal deficits noted.   Assessment & Plan  Encounter for chronic pain management Pain contract reviewed and signed. Pt will need to get pain medication from me until his appointment in [place] on 4/24, he is awrae to call and verify the appt asap 4 weeks of scripts handed , one week each, at the visit  Essential hypertension Controlled, no change in medication DASH diet and commitment to daily physical activity for a minimum of 30 minutes discussed and encouraged, as a part of hypertension management. The importance of attaining a healthy weight is also discussed.  BP/Weight 02/12/2017 12/27/2016 10/17/2016 09/20/2016 09/05/2016 08/15/2016 06/15/2016  Systolic BP 120 130 153 -  140 136 147  Diastolic BP 80 72 79 - 70 68 69  Wt. (Lbs) 215 210 209 202 207 202 201  BMI 31.75 31.01 31.32 31.64 31.94 31.17 31

## 2017-02-12 NOTE — Assessment & Plan Note (Signed)
Controlled, no change in medication DASH diet and commitment to daily physical activity for a minimum of 30 minutes discussed and encouraged, as a part of hypertension management. The importance of attaining a healthy weight is also discussed.  BP/Weight 02/12/2017 12/27/2016 10/17/2016 09/20/2016 09/05/2016 08/15/2016 123456  Systolic BP 123456 [id number]  Diastolic BP 80 72 79 - 70 68 69  Wt. (Lbs) 215 210 209 202 207 202 201  BMI 31.75 31.01 31.32 31.64 31.94 31.17 31

## 2017-02-13 ENCOUNTER — Other Ambulatory Visit: Payer: Self-pay | Admitting: Family Medicine

## 2017-02-15 ENCOUNTER — Other Ambulatory Visit: Payer: Self-pay | Admitting: Family Medicine

## 2017-02-17 ENCOUNTER — Telehealth: Payer: Self-pay | Admitting: Family Medicine

## 2017-02-17 NOTE — Telephone Encounter (Signed)
pls reschedule  His welcome to medicare appt, he has his pain clinic appt on the same day and time April 24, resched welcome to medicare appt to May please he has to get that with me, thanks

## 2017-02-19 ENCOUNTER — Ambulatory Visit: Payer: Medicaid Other | Admitting: Pulmonary Disease

## 2017-02-20 NOTE — Telephone Encounter (Signed)
His Welcome To Medicare has been R/S to April 23rd, I mailed him a new appointment card

## 2017-03-04 NOTE — Telephone Encounter (Signed)
No  Additional n notes for this encounter

## 2017-03-05 ENCOUNTER — Other Ambulatory Visit: Payer: Self-pay

## 2017-03-05 MED ORDER — OXYCODONE-ACETAMINOPHEN 5-325 MG PO TABS: ORAL_TABLET | ORAL | 0 refills | Status: DC

## 2017-03-12 ENCOUNTER — Other Ambulatory Visit: Payer: Self-pay | Admitting: Family Medicine

## 2017-04-02 ENCOUNTER — Telehealth: Payer: Self-pay | Admitting: Family Medicine

## 2017-04-02 MED ORDER — OXYCODONE-ACETAMINOPHEN 5-325 MG PO TABS: ORAL_TABLET | ORAL | 0 refills | Status: DC

## 2017-04-02 NOTE — Telephone Encounter (Signed)
On 3/19, patient was given 3 prescriptions (each to last 7 days) He has an appt with pain management on 4/24. The last rx was filled on 4/9 for 7 days and he is out today. Has been calling for a refill to last until he sees pain management next Tuesday. Dr Moshe Cipro thought she was giving enough to last but he will be a week short. Do you want to bridge him until 4/24 or tell him he has to wait until his pain manag. appt?

## 2017-04-02 NOTE — Telephone Encounter (Signed)
Please print an 8 day prescription for percocet 5 mg BID # 16.  I will keep him on med until seen.  He MUST keep this appt.  YSN

## 2017-04-02 NOTE — Telephone Encounter (Signed)
Patient calling to request Percocet Rx for back and leg pain.  He states he has an appointment with Pain Management April 10, 2017 in [place].  cb  [phone]

## 2017-04-09 ENCOUNTER — Encounter: Payer: Medicaid Other | Admitting: Family Medicine

## 2017-04-10 ENCOUNTER — Encounter: Payer: Medicaid Other | Admitting: Family Medicine

## 2017-04-11 ENCOUNTER — Telehealth: Payer: Self-pay

## 2017-04-11 NOTE — Telephone Encounter (Signed)
Needs to reschedule appt he missed Monday and bring allmeds to visit.  She will need to get the pain management note to review. There is no guarantee he will get any pain med from this office. He may need to see Dr Luna Glasgow again

## 2017-04-11 NOTE — Telephone Encounter (Signed)
Patient walked in the office earlier requesting Percocet. States he went to his pain management appt yesterday but they did not give him any pain medication. They said it would be at least a month before they prescribe narcotics (after they see a need for them) He has another appt scheduled in May. He is requesting more percocet until they start prescribing. He is aware that pain meds from pain management isn't a guarantee. He decided to wait in the lobby

## 2017-04-11 NOTE — Telephone Encounter (Signed)
He missed his physical this week, likely needs labs done, needs to bring his meds and reschedule ALL of that, I need to get the info from the pain clinic, I will nOT be prescribing any pain medication today, and may not necessarily continue to prescribe pain medication fpor him, I will be going off of recommendation from the pain doc so he may need to return to dr Luna Glasgow for his pain management

## 2017-04-11 NOTE — Telephone Encounter (Signed)
Patient has an appt with Dr Moshe Cipro for 04/18/17 @2 :20 . He has been instructed to bring all meds with him to his visit.  He also states he has appt with Pain Clinic(in [place])  next month and that he has already had  1 visit with them (yesterday), but was told that he would not be able to get any pain meds from them for one month.

## 2017-04-18 ENCOUNTER — Ambulatory Visit: Payer: Medicaid Other | Admitting: Family Medicine

## 2017-04-18 ENCOUNTER — Telehealth: Payer: Self-pay | Admitting: Family Medicine

## 2017-04-18 NOTE — Telephone Encounter (Signed)
Patient missed appt today @ 2:20. Wife called to check on appt time @ 11:45 today (message was left on office vm to return call ).  Patient thought his appt was for May 7th.  Patient did say that they had received a courtesy call reminding him of the appt today (generally these courtesy calls are done 2 days in advance which gives the patient ample time to reschedule). When patient came in the office requesting pain meds last week, he was given a printed appt reminder with date & time highlighted, and was told to bring all his meds.  Patient apologized for not keeping appt and requested soonest appt available w/Dr. Moshe Cipro.  Please advise as to when patient can be seen.  Soonest appt available without altering schedule is June 5th.

## 2017-04-18 NOTE — Telephone Encounter (Signed)
Give the June 5 appt please

## 2017-04-19 NOTE — Telephone Encounter (Signed)
See dr Valentina Gu

## 2017-05-22 ENCOUNTER — Other Ambulatory Visit: Payer: Self-pay | Admitting: Family Medicine

## 2017-05-22 ENCOUNTER — Encounter: Payer: Self-pay | Admitting: Family Medicine

## 2017-05-22 ENCOUNTER — Ambulatory Visit (INDEPENDENT_AMBULATORY_CARE_PROVIDER_SITE_OTHER): Payer: Medicaid Other | Admitting: Family Medicine

## 2017-05-22 VITALS — BP 130/74 | HR 72 | Resp 16 | Ht 69.0 in | Wt 206.0 lb

## 2017-05-22 DIAGNOSIS — J3089 Other allergic rhinitis: Secondary | ICD-10-CM

## 2017-05-22 DIAGNOSIS — G894 Chronic pain syndrome: Secondary | ICD-10-CM

## 2017-05-22 DIAGNOSIS — E785 Hyperlipidemia, unspecified: Secondary | ICD-10-CM

## 2017-05-22 DIAGNOSIS — M5410 Radiculopathy, site unspecified: Secondary | ICD-10-CM | POA: Diagnosis not present

## 2017-05-22 DIAGNOSIS — E1169 Type 2 diabetes mellitus with other specified complication: Secondary | ICD-10-CM | POA: Diagnosis not present

## 2017-05-22 DIAGNOSIS — I10 Essential (primary) hypertension: Secondary | ICD-10-CM

## 2017-05-22 DIAGNOSIS — E669 Obesity, unspecified: Secondary | ICD-10-CM

## 2017-05-22 DIAGNOSIS — M5090 Cervical disc disorder, unspecified, unspecified cervical region: Secondary | ICD-10-CM

## 2017-05-22 DIAGNOSIS — Z91199 Patient's noncompliance with other medical treatment and regimen due to unspecified reason: Secondary | ICD-10-CM

## 2017-05-22 DIAGNOSIS — F411 Generalized anxiety disorder: Secondary | ICD-10-CM | POA: Diagnosis not present

## 2017-05-22 DIAGNOSIS — Z9119 Patient's noncompliance with other medical treatment and regimen: Secondary | ICD-10-CM

## 2017-05-22 MED ORDER — KETOROLAC TROMETHAMINE 60 MG/2ML IM SOLN
60.0000 mg | Freq: Once | INTRAMUSCULAR | Status: AC
  Administered 2017-05-22: 60 mg via INTRAMUSCULAR

## 2017-05-22 MED ORDER — NAPROXEN 500 MG PO TABS: 500.0000 mg | ORAL_TABLET | Freq: Two times a day (BID) | ORAL | 0 refills | Status: DC

## 2017-05-22 MED ORDER — METHYLPREDNISOLONE ACETATE 80 MG/ML IJ SUSP
80.0000 mg | Freq: Once | INTRAMUSCULAR | Status: AC
  Administered 2017-05-22: 80 mg via INTRAMUSCULAR

## 2017-05-22 NOTE — Patient Instructions (Addendum)
F/u in  4 months   Lipid, cmp and eGFr, HBa1C today  Letter to pharmacy stating No controlled substances are being prescribed from this office effective 06/05./2018, include xanax, aderall, hyrdrocodone insufficient, working on your lock in sitiuation   Injections are given in the office today for neck anf RUE pain and a 6 day course of anti inflammatories prescribed.    Need to contact medicaid as you are locked in as far a some of your medications are concerned

## 2017-05-22 NOTE — Progress Notes (Signed)
Mark Walls     MRN: [id number]      DOB: 06-18-1956   HPI Mark Walls is here for follow up and re-evaluation of chronic medical conditions, medication management and review of any available recent lab and radiology data.  Preventive health is updated, specifically  Cancer screening and Immunization.   Questions or concerns regarding consultations or procedures which the PT has had in the interim are  addressed. The PT denies any adverse reactions to current medications since the last visit.  C/ neck and RUE pain x 1 month, will address this with pain clinic when he is sen there Curently in lock in at pharmacy states unable to fill xanax script from Dr Omelia Blackwater written 05/3 for 60 tabs,  Recent Ov at pain clinic requires reduction in xanax dose to become a part of the clinic Labs are past due for chronic medical conditions and he needs to get these today   ROS Denies recent fever or chills. Denies sinus pressure, nasal congestion, ear pain or sore throat. Denies chest congestion, productive cough or wheezing. Denies chest pains, palpitations and leg swelling Denies abdominal pain, nausea, vomiting,diarrhea or constipation.   Denies dysuria, frequency, hesitancy or incontinenDenies headaches, seizures,   Denies skin break down or rash.   PE  BP 130/74   Pulse 72   Resp 16   Ht 5\' 9"  (1.753 m)   Wt 206 lb (93.4 kg)   SpO2 97%   BMI 30.42 kg/m   Patient alert and oriented and in no cardiopulmonary distress.  HEENT: No facial asymmetry, EOMI,   oropharynx pink and moist.  Neck decreased ROmno JVD, no mass.  Chest: Clear to auscultation bilaterally.  CVS: S1, S2 no murmurs, no S3.Regular rate.  ABD: Soft non tender.   Ext: No edema  MS: Decreased  ROM spine, shoulders, hips and knees.  Skin: Intact, no ulcerations or rash noted.  Psych: Good eye contact, normal affect. Memory intact not anxious or depressed appearing.  CNS: CN 2-12 intact, power,  normal  throughout.no focal deficits noted.   Assessment & Plan  Essential hypertension Controlled, no change in medication DASH diet and commitment to daily physical activity for a minimum of 30 minutes discussed and encouraged, as a part of hypertension management. The importance of attaining a healthy weight is also discussed.  BP/Weight 05/22/2017 02/12/2017 12/27/2016 10/17/2016 09/20/2016 09/05/2016 08/15/2016  Systolic BP 130 120 130 153 - 409 136  Diastolic BP 74 80 72 79 - 70 68  Wt. (Lbs) 206 215 210 209 202 207 202  BMI 30.42 31.75 31.01 31.32 31.64 31.94 31.17       GAD (generalized anxiety disorder) Treated by psychiatry, now on Xan twice daily , a reduced dose  Because of need to start pain medication at ths lower dose, will work on getting him locked in with psychiatry prescribing the xanax. This was successfully done the day of the visit, pt notified 2 days later  H/O noncompliance with medical treatment, presenting hazards to health Ongoing challenge, left the office , needed labs , never got them done despite being asked to do so  Hyperlipidemia LDL goal <100 Updated lab needed and is past due   Diabetes mellitus type 2 in obese [facility]) Updated lab needed and is past due   Chronic pain syndrome Currently in a pain clinic but has not started getting prescriptions as yet  Cervical neck pain with evidence of disc disease Uncontrolled.Toradol and depo medrol administered IM  in the office , to be followed by a short course of oral prednisone and NSAIDS.

## 2017-05-25 ENCOUNTER — Encounter: Payer: Self-pay | Admitting: Family Medicine

## 2017-05-25 DIAGNOSIS — M5090 Cervical disc disorder, unspecified, unspecified cervical region: Secondary | ICD-10-CM | POA: Insufficient documentation

## 2017-05-25 NOTE — Assessment & Plan Note (Signed)
Updated lab needed and is past due 

## 2017-05-25 NOTE — Assessment & Plan Note (Signed)
Uncontrolled.Toradol and depo medrol administered IM in the office , to be followed by a short course of oral prednisone and NSAIDS.  

## 2017-05-25 NOTE — Assessment & Plan Note (Signed)
Treated by psychiatry, now on Xan twice daily , a reduced dose  Because of need to start pain medication at ths lower dose, will work on getting him locked in with psychiatry prescribing the xanax. This was successfully done the day of the visit, pt notified 2 days later

## 2017-05-25 NOTE — Assessment & Plan Note (Signed)
Controlled, no change in medication DASH diet and commitment to daily physical activity for a minimum of 30 minutes discussed and encouraged, as a part of hypertension management. The importance of attaining a healthy weight is also discussed.  BP/Weight 05/22/2017 02/12/2017 12/27/2016 10/17/2016 09/20/2016 09/05/2016 0/14/9969  Systolic BP 249 324 199 144 - 458 483  Diastolic BP 74 80 72 79 - 70 68  Wt. (Lbs) 206 215 210 209 202 207 202  BMI 30.42 31.75 31.01 31.32 31.64 31.94 31.17

## 2017-05-25 NOTE — Assessment & Plan Note (Addendum)
Ongoing challenge, left the office , needed labs , never got them done despite being asked to do so

## 2017-05-25 NOTE — Assessment & Plan Note (Signed)
Currently in a pain clinic but has not started getting prescriptions as yet

## 2017-06-13 LAB — LIPID PANEL
CHOL/HDL RATIO: 5 ratio — AB (ref <5.0)
Cholesterol: 212 mg/dL — ABNORMAL HIGH (ref <200)
HDL: 42 mg/dL (ref >40)
LDL CALC: 152 mg/dL — AB (ref <100)
Triglycerides: 90 mg/dL (ref <150)
VLDL: 18 mg/dL (ref <30)

## 2017-06-13 LAB — COMPLETE METABOLIC PANEL WITH GFR
ALT: 70 U/L — ABNORMAL HIGH (ref 9–46)
AST: 29 U/L (ref 10–35)
Albumin: 4.1 g/dL (ref 3.6–5.1)
Alkaline Phosphatase: 75 U/L (ref 40–115)
BUN: 18 mg/dL (ref 7–25)
CALCIUM: 9.5 mg/dL (ref 8.6–10.3)
CHLORIDE: 106 mmol/L (ref 98–110)
CO2: 24 mmol/L (ref 20–31)
Creat: 0.94 mg/dL (ref 0.70–1.25)
GFR, EST NON AFRICAN AMERICAN: 87 mL/min (ref >=60)
GFR, Est African American: >89 mL/min (ref >=60)
Glucose, Bld: 179 mg/dL — ABNORMAL HIGH (ref 65–99)
POTASSIUM: 4.4 mmol/L (ref 3.5–5.3)
Sodium: 138 mmol/L (ref 135–146)
Total Bilirubin: 0.8 mg/dL (ref 0.2–1.2)
Total Protein: 7 g/dL (ref 6.1–8.1)

## 2017-06-13 LAB — HEMOGLOBIN A1C
HEMOGLOBIN A1C: 6.8 % — AB (ref <5.7)
MEAN PLASMA GLUCOSE: 148 mg/dL

## 2017-06-17 ENCOUNTER — Other Ambulatory Visit: Payer: Self-pay | Admitting: Family Medicine

## 2017-06-18 NOTE — Telephone Encounter (Signed)
Seen 6 5 18 

## 2017-07-02 ENCOUNTER — Telehealth: Payer: Self-pay | Admitting: Family Medicine

## 2017-07-02 NOTE — Telephone Encounter (Signed)
Spoke to patient. He states that he was told at pharmacy that Dr Moshe Cipro had put a hold on his pain meds.  Please advise

## 2017-07-02 NOTE — Telephone Encounter (Signed)
I do not prescribe his pain medication, he needs to discuss with the Doctor treating his pain medication

## 2017-07-02 NOTE — Telephone Encounter (Signed)
Patient left message on nurse line for someone to call him back asap.   Number given [phone]  Called patient back on number provided in voicemail- phone rang and I got a message stating voicemail had not been set up for that number.

## 2017-07-03 NOTE — Telephone Encounter (Signed)
Called patient regarding message below. No answer, unable to leave message.  

## 2017-07-18 ENCOUNTER — Other Ambulatory Visit: Payer: Self-pay | Admitting: Family Medicine

## 2017-08-17 ENCOUNTER — Other Ambulatory Visit: Payer: Self-pay | Admitting: Family Medicine

## 2017-08-18 ENCOUNTER — Other Ambulatory Visit: Payer: Self-pay | Admitting: Family Medicine

## 2017-09-18 ENCOUNTER — Other Ambulatory Visit: Payer: Self-pay | Admitting: Family Medicine

## 2017-10-02 ENCOUNTER — Other Ambulatory Visit: Payer: Self-pay | Admitting: Family Medicine

## 2017-10-03 ENCOUNTER — Ambulatory Visit: Payer: Medicaid Other | Admitting: Family Medicine

## 2017-10-03 NOTE — Telephone Encounter (Signed)
Seen 6 5 18

## 2017-10-17 ENCOUNTER — Encounter: Payer: Self-pay | Admitting: Family Medicine

## 2017-10-17 ENCOUNTER — Ambulatory Visit (INDEPENDENT_AMBULATORY_CARE_PROVIDER_SITE_OTHER): Payer: Medicaid Other | Admitting: Family Medicine

## 2017-10-17 VITALS — BP 160/80 | HR 83 | Resp 16 | Ht 69.0 in | Wt 213.0 lb

## 2017-10-17 DIAGNOSIS — E1169 Type 2 diabetes mellitus with other specified complication: Secondary | ICD-10-CM

## 2017-10-17 DIAGNOSIS — E785 Hyperlipidemia, unspecified: Secondary | ICD-10-CM

## 2017-10-17 DIAGNOSIS — Z23 Encounter for immunization: Secondary | ICD-10-CM | POA: Diagnosis not present

## 2017-10-17 DIAGNOSIS — E1165 Type 2 diabetes mellitus with hyperglycemia: Secondary | ICD-10-CM | POA: Diagnosis not present

## 2017-10-17 DIAGNOSIS — M25521 Pain in right elbow: Secondary | ICD-10-CM

## 2017-10-17 DIAGNOSIS — I10 Essential (primary) hypertension: Secondary | ICD-10-CM

## 2017-10-17 DIAGNOSIS — Z9119 Patient's noncompliance with other medical treatment and regimen: Secondary | ICD-10-CM | POA: Diagnosis not present

## 2017-10-17 DIAGNOSIS — Z91199 Patient's noncompliance with other medical treatment and regimen due to unspecified reason: Secondary | ICD-10-CM

## 2017-10-17 DIAGNOSIS — Z125 Encounter for screening for malignant neoplasm of prostate: Secondary | ICD-10-CM

## 2017-10-17 DIAGNOSIS — E669 Obesity, unspecified: Secondary | ICD-10-CM

## 2017-10-17 MED ORDER — LOSARTAN POTASSIUM 50 MG PO TABS: 50.0000 mg | ORAL_TABLET | Freq: Every day | ORAL | 1 refills | Status: DC

## 2017-10-17 MED ORDER — BUDESONIDE-FORMOTEROL FUMARATE 80-4.5 MCG/ACT IN AERO: 2.0000 | INHALATION_SPRAY | Freq: Two times a day (BID) | RESPIRATORY_TRACT | 3 refills | Status: DC

## 2017-10-17 MED ORDER — SITAGLIPTIN PHOS-METFORMIN HCL 50-1000 MG PO TABS: 2.0000 | ORAL_TABLET | Freq: Every day | ORAL | 5 refills | Status: DC

## 2017-10-17 MED ORDER — METOPROLOL TARTRATE 25 MG PO TABS: 12.5000 mg | ORAL_TABLET | Freq: Two times a day (BID) | ORAL | 1 refills | Status: DC

## 2017-10-17 MED ORDER — GLIPIZIDE ER 10 MG PO TB24: ORAL_TABLET | ORAL | 5 refills | Status: DC

## 2017-10-17 NOTE — Patient Instructions (Addendum)
F/u in 4 months, call if you need me sooner  Nurse BP check in 5 weeks  PLEASEfill ALL medication and astart taking today  You will be referred to MD re join pains as requested  Labs today lipid, cmp and EGFR, HBA1C and PSA  Please work on good  health habits so that your health will improve. 1. Commitment to daily physical activity for 30 to 60  minutes, if you are able to do this.  2. Commitment to wise food choices. Aim for half of your  food intake to be vegetable and fruit, one quarter starchy foods, and one quarter protein. Try to eat on a regular schedule  3 meals per day, snacking between meals should be limited to vegetables or fruits or small portions of nuts. 64 ounces of water per day is generally recommended, unless you have specific health conditions, like heart failure or kidney failure where you will need to limit fluid intake.  3. Commitment to sufficient and a  good quality of physical and mental rest daily, generally between 6 to 8 hours per day.  WITH PERSISTANCE AND PERSEVERANCE, THE IMPOSSIBLE , BECOMES THE NORM!   Thank you  for choosing [facility]. We consider it a privelige to serve you.  Delivering excellent health care in a caring and  compassionate way is our goal.  Partnering with you,  so that together we can achieve this goal is our strategy.

## 2017-10-19 ENCOUNTER — Other Ambulatory Visit: Payer: Self-pay | Admitting: Family Medicine

## 2017-10-19 DIAGNOSIS — J3089 Other allergic rhinitis: Secondary | ICD-10-CM

## 2017-10-21 ENCOUNTER — Other Ambulatory Visit: Payer: Self-pay | Admitting: Family Medicine

## 2017-10-21 ENCOUNTER — Encounter: Payer: Self-pay | Admitting: Family Medicine

## 2017-10-21 DIAGNOSIS — M25521 Pain in right elbow: Secondary | ICD-10-CM

## 2017-10-21 NOTE — Assessment & Plan Note (Signed)
Uncontrolled due to medical n non compliance. Medications are sent to pharmacy, pt states he is collecting them as soon as he leaves the office.Non compliance in this pt remains a majpor problem DASH diet and commitment to daily physical activity for a minimum of 30 minutes discussed and encouraged, as a part of hypertension management. The importance of attaining a healthy weight is also discussed.  BP/Weight 10/17/2017 05/22/2017 02/12/2017 12/27/2016 10/17/2016 09/20/2016 6/54/6503  Systolic BP 546 568 127 517 001 - 749  Diastolic BP 80 74 80 72 79 - 70  Wt. (Lbs) 213 206 215 210 209 202 207  BMI 31.45 30.42 31.75 31.01 31.32 31.64 31.94

## 2017-10-21 NOTE — Assessment & Plan Note (Signed)
Currently a problem no change in behavior presenting hazards to health unfortunately

## 2017-10-21 NOTE — Assessment & Plan Note (Addendum)
Reports increased right elbow pain has seen ortho in the past for this and requests referral back, also for bilateral hand  pain

## 2017-10-21 NOTE — Progress Notes (Signed)
Needs refferral to Dr Burney Gauze to evaluate and treat right elbow pain and bilateral hand pain please

## 2017-10-21 NOTE — Assessment & Plan Note (Signed)
Hyperlipidemia:Low fat diet discussed and encouraged.   Lipid Panel  Lab Results  Component Value Date   CHOL 212 (H) 06/12/2017   HDL 42 06/12/2017   LDLCALC 152 (H) 06/12/2017   TRIG 90 06/12/2017   CHOLHDL 5.0 (H) 06/12/2017   Uncontrolled, rept lab requested on day of visit, pt still has not had lab drawn

## 2017-10-21 NOTE — Progress Notes (Signed)
Mark Walls     MRN: [id number]      DOB: 01/07/1956   HPI [name] is here for follow up and re-evaluation of chronic medical conditions, medication management and review of any available recent lab and radiology data.  Preventive health is updated, specifically  Cancer screening and Immunization.   Questions or concerns regarding consultations or procedures which the PT has had in the interim are  addressed. The PT denies any adverse reactions to current medications since the last visit.  C/o increased bilateral hand pain and right elbow pain , wants to be referred back  to orthopedic oc who has treated him in the past for these States he is out of some of his medications and is requesting refills. Being followed at pain clinic and by psychiatry regulalrly ROS Denies recent fever or chills. Denies sinus pressure, nasal congestion, ear pain or sore throat. Denies chest congestion, productive cough or wheezing. Denies chest pains, palpitations and leg swelling Denies abdominal pain, nausea, vomiting,diarrhea or constipation.   Denies dysuria, frequency, hesitancy or incontinence.  Denies headaches, seizures, numbness, or tingling. Denies depression, anxiety or insomnia. Denies skin break down or rash.   PE  BP (!) 160/80   Pulse 83   Resp 16   Ht 5\' 9"  (1.753 m)   Wt 213 lb (96.6 kg)   SpO2 97%   BMI 31.45 kg/m   Patient alert and oriented and in no cardiopulmonary distress.  HEENT: No facial asymmetry, EOMI,   oropharynx pink and moist.  Neck supple no JVD, no mass.  Chest: Clear to auscultation bilaterally.Decreased though adequate air entry  CVS: S1, S2 no murmurs, no S3.Regular rate.  ABD: Soft non tender.   Ext: No edema  MS: Decreased  ROM spine, shoulders, hips and knees.Decreased ROM right elbow and wrists which are deformed and tender  Skin: Intact, no ulcerations or rash noted.  Psych: Good eye contact, normal affect. Memory intact not anxious or  depressed appearing.  CNS: CN 2-12 intact, power,  normal throughout.no focal deficits noted.   Assessment & Plan  Elbow pain, right Reports increased right elbow pain has seen ortho in the past for this and requests referral back, also for bilateral hand  pain  Essential hypertension Uncontrolled due to medical n non compliance. Medications are sent to pharmacy, pt states he is collecting them as soon as he leaves the office.Non compliance in this pt remains a majpor problem DASH diet and commitment to daily physical activity for a minimum of 30 minutes discussed and encouraged, as a part of hypertension management. The importance of attaining a healthy weight is also discussed.  BP/Weight 10/17/2017 05/22/2017 02/12/2017 12/27/2016 10/17/2016 09/20/2016 2/95/1884  Systolic BP 166 063 016 010 932 - 355  Diastolic BP 80 74 80 72 79 - 70  Wt. (Lbs) 213 206 215 210 209 202 207  BMI 31.45 30.42 31.75 31.01 31.32 31.64 31.94       Hyperlipidemia LDL goal <100 Hyperlipidemia:Low fat diet discussed and encouraged.   Lipid Panel  Lab Results  Component Value Date   CHOL 212 (H) 06/12/2017   HDL 42 06/12/2017   LDLCALC 152 (H) 06/12/2017   TRIG 90 06/12/2017   CHOLHDL 5.0 (H) 06/12/2017   Uncontrolled, rept lab requested on day of visit, pt still has not had lab drawn    H/O noncompliance with medical treatment, presenting hazards to health Currently a problem no change in behavior presenting hazards to health unfortunately

## 2017-10-22 ENCOUNTER — Other Ambulatory Visit: Payer: Self-pay | Admitting: Family Medicine

## 2017-10-22 DIAGNOSIS — M79641 Pain in right hand: Secondary | ICD-10-CM

## 2017-10-22 DIAGNOSIS — M79642 Pain in left hand: Principal | ICD-10-CM

## 2017-10-22 DIAGNOSIS — M25529 Pain in unspecified elbow: Secondary | ICD-10-CM

## 2017-10-31 LAB — COMPLETE METABOLIC PANEL WITH GFR
AG Ratio: 1.4 (calc) (ref 1.0–2.5)
ALBUMIN MSPROF: 4.1 g/dL (ref 3.6–5.1)
ALKALINE PHOSPHATASE (APISO): 85 U/L (ref 40–115)
ALT: 75 U/L — ABNORMAL HIGH (ref 9–46)
AST: 33 U/L (ref 10–35)
BILIRUBIN TOTAL: 0.5 mg/dL (ref 0.2–1.2)
BUN: 14 mg/dL (ref 7–25)
CO2: 31 mmol/L (ref 20–32)
Calcium: 10 mg/dL (ref 8.6–10.3)
Chloride: 101 mmol/L (ref 98–110)
Creat: 0.82 mg/dL (ref 0.70–1.25)
GFR, Est African American: 109 mL/min/{1.73_m2} (ref >=60)
GFR, Est Non African American: 94 mL/min/{1.73_m2} (ref >=60)
GLUCOSE: 189 mg/dL — AB (ref 65–99)
Globulin: 2.9 g/dL (calc) (ref 1.9–3.7)
POTASSIUM: 5 mmol/L (ref 3.5–5.3)
SODIUM: 136 mmol/L (ref 135–146)
Total Protein: 7 g/dL (ref 6.1–8.1)

## 2017-10-31 LAB — LIPID PANEL
CHOLESTEROL: 234 mg/dL — AB (ref <200)
HDL: 41 mg/dL (ref >40)
LDL Cholesterol (Calc): 155 mg/dL (calc) — ABNORMAL HIGH
Non-HDL Cholesterol (Calc): 193 mg/dL (calc) — ABNORMAL HIGH (ref <130)
TRIGLYCERIDES: 223 mg/dL — AB (ref <150)
Total CHOL/HDL Ratio: 5.7 (calc) — ABNORMAL HIGH (ref <5.0)

## 2017-10-31 LAB — HEMOGLOBIN A1C
EAG (MMOL/L): 8.9 (calc)
HEMOGLOBIN A1C: 7.2 %{Hb} — AB (ref <5.7)
MEAN PLASMA GLUCOSE: 160 (calc)

## 2017-10-31 LAB — PSA: PSA: 1.2 ng/mL (ref <=4.0)

## 2017-11-13 ENCOUNTER — Other Ambulatory Visit: Payer: Self-pay

## 2017-11-13 MED ORDER — EZETIMIBE 10 MG PO TABS: ORAL_TABLET | ORAL | 3 refills | Status: DC

## 2017-11-19 ENCOUNTER — Other Ambulatory Visit: Payer: Self-pay | Admitting: Family Medicine

## 2018-02-14 ENCOUNTER — Ambulatory Visit: Payer: Medicaid Other | Admitting: Family Medicine

## 2018-02-25 ENCOUNTER — Encounter: Payer: Self-pay | Admitting: Family Medicine

## 2018-02-25 ENCOUNTER — Ambulatory Visit: Payer: Medicaid Other | Admitting: Family Medicine

## 2018-02-25 ENCOUNTER — Ambulatory Visit (HOSPITAL_COMMUNITY)
Admission: RE | Admit: 2018-02-25 | Discharge: 2018-02-25 | Disposition: A | Discharge status: Home / Self Care | Payer: Medicaid Other | Source: Ambulatory Visit | Attending: Family Medicine | Admitting: Family Medicine

## 2018-02-25 VITALS — BP 170/80 | HR 74 | Resp 16 | Ht 69.0 in | Wt 216.0 lb

## 2018-02-25 DIAGNOSIS — E785 Hyperlipidemia, unspecified: Secondary | ICD-10-CM

## 2018-02-25 DIAGNOSIS — I10 Essential (primary) hypertension: Secondary | ICD-10-CM | POA: Diagnosis not present

## 2018-02-25 DIAGNOSIS — E1169 Type 2 diabetes mellitus with other specified complication: Secondary | ICD-10-CM

## 2018-02-25 DIAGNOSIS — M546 Pain in thoracic spine: Secondary | ICD-10-CM | POA: Insufficient documentation

## 2018-02-25 DIAGNOSIS — E669 Obesity, unspecified: Secondary | ICD-10-CM

## 2018-02-25 MED ORDER — LOSARTAN POTASSIUM 100 MG PO TABS: 100.0000 mg | ORAL_TABLET | Freq: Every day | ORAL | 5 refills | Status: DC

## 2018-02-25 MED ORDER — IBUPROFEN 800 MG PO TABS: 800.0000 mg | ORAL_TABLET | Freq: Three times a day (TID) | ORAL | 0 refills | Status: DC

## 2018-02-25 MED ORDER — METHYLPREDNISOLONE ACETATE 80 MG/ML IJ SUSP
80.0000 mg | Freq: Once | INTRAMUSCULAR | Status: AC
  Administered 2018-02-25: 80 mg via INTRAMUSCULAR

## 2018-02-25 MED ORDER — PREDNISONE 20 MG PO TABS
ORAL_TABLET | ORAL | 0 refills | Status: DC
Start: 2018-02-25 — End: 2018-07-04

## 2018-02-25 MED ORDER — KETOROLAC TROMETHAMINE 60 MG/2ML IM SOLN
60.0000 mg | Freq: Once | INTRAMUSCULAR | Status: AC
  Administered 2018-02-25: 60 mg via INTRAMUSCULAR

## 2018-02-25 NOTE — Patient Instructions (Addendum)
Physical exam in June, call if  You need me sooner  X ray of upper back today   Toradol and depo medrol in office today for upper back pain and X ray of your upper back  5 day copurs of prednisone and shrt course of anti inflammatories prescribed  Fasting lipid, cmp and eGFR and hBA1C in 2 months  Increase dose of cozaar to 100 mg daily , this is sent in , your BP is high  Thank you  for choosing [facility]. We consider it a privelige to serve you.  Delivering excellent health care in a caring and  compassionate way is our goal.  Partnering with you,  so that together we can achieve this goal is our strategy.

## 2018-02-26 ENCOUNTER — Telehealth: Payer: Self-pay

## 2018-02-26 NOTE — Telephone Encounter (Signed)
-----   Message from Fayrene Helper, MD sent at 02/25/2018  6:11 PM EDT ----- The X ray of your upper back does not show any major abnormality

## 2018-02-26 NOTE — Telephone Encounter (Signed)
Called patient advised to call back to receive xray results, per DPR in chart.

## 2018-02-27 ENCOUNTER — Telehealth: Payer: Self-pay

## 2018-02-27 NOTE — Telephone Encounter (Signed)
Patient left message on nurse line returning call.

## 2018-02-27 NOTE — Telephone Encounter (Signed)
Patient called and advised of results, no questions or concerns.

## 2018-02-27 NOTE — Telephone Encounter (Signed)
Called and notified patient of Xray results. Patient understood, and had no questions.

## 2018-03-02 ENCOUNTER — Encounter: Payer: Self-pay | Admitting: Family Medicine

## 2018-03-02 DIAGNOSIS — M546 Pain in thoracic spine: Secondary | ICD-10-CM | POA: Insufficient documentation

## 2018-03-02 NOTE — Assessment & Plan Note (Signed)
Uncontrolled.Toradol and depo medrol administered IM in the office , to be followed by a short course of oral prednisone  X ray of T spine today.

## 2018-03-02 NOTE — Assessment & Plan Note (Signed)
Uncontrolled,increase dose of cozaar DASH diet and commitment to daily physical activity for a minimum of 30 minutes discussed and encouraged, as a part of hypertension management. The importance of attaining a healthy weight is also discussed.  BP/Weight 02/25/2018 10/17/2017 05/22/2017 02/12/2017 12/27/2016 10/17/2016 30/0/7622  Systolic BP 633 354 562 563 893 734 -  Diastolic BP 80 80 74 80 72 79 -  Wt. (Lbs) 216 213 206 215 210 209 202  BMI 31.9 31.45 30.42 31.75 31.01 31.32 31.64

## 2018-03-02 NOTE — Assessment & Plan Note (Signed)
Hyperlipidemia:Low fat diet discussed and encouraged.   Lipid Panel  Lab Results  Component Value Date   CHOL 234 (H) 10/30/2017   HDL 41 10/30/2017   LDLCALC 155 (H) 10/30/2017   TRIG 223 (H) 10/30/2017   CHOLHDL 5.7 (H) 10/30/2017  uncontrolled Updated lab needed at/ before next visit.    '

## 2018-03-02 NOTE — Assessment & Plan Note (Signed)
Mark Walls is reminded of the importance of commitment to daily physical activity for 30 minutes or more, as able and the need to limit carbohydrate intake to 30 to 60 grams per meal to help with blood sugar control.   The need to take medication as prescribed, test blood sugar as directed, and to call between visits if there is a concern that blood sugar is uncontrolled is also discussed.   Mark Walls is reminded of the importance of daily foot exam, annual eye examination, and good blood sugar, blood pressure and cholesterol control. Controlled when last tested , updated lab due   Diabetic Labs Latest Ref Rng & Units 10/30/2017 06/12/2017 12/27/2016 07/28/2016 04/19/2016  HbA1c <5.7 % of total Hgb 7.2(H) 6.8(H) 7.0(H) 6.0(H) -  Microalbumin Not estab mg/dL - - - 1.0 1.0  Micro/Creat Ratio <30 mcg/mg creat - - - 11 4  Chol <200 mg/dL 234(H) 212(H) 246(H) 208(H) -  HDL >40 mg/dL 41 42 37(L) 45 -  Calc LDL mg/dL (calc) 155(H) 152(H) 172(H) 139(H) -  Triglycerides <150 mg/dL 223(H) 90 184(H) 119 -  Creatinine 0.70 - 1.25 mg/dL 0.82 0.94 1.04 0.89 -   BP/Weight 02/25/2018 10/17/2017 05/22/2017 02/12/2017 12/27/2016 10/17/2016 37/01/9020  Systolic BP 115 520 802 233 612 244 -  Diastolic BP 80 80 74 80 72 79 -  Wt. (Lbs) 216 213 206 215 210 209 202  BMI 31.9 31.45 30.42 31.75 31.01 31.32 31.64   Foot/eye exam completion dates Latest Ref Rng & Units 02/25/2018 12/27/2016  Eye Exam No Retinopathy - -  Foot Form Completion - Done Done

## 2018-03-02 NOTE — Progress Notes (Signed)
Mark Walls     MRN: [id number]      DOB: October 23, 1956   HPI Mark Walls is here for follow up and re-evaluation of chronic medical conditions, medication management and review of any available recent lab and radiology data.  Preventive health is updated, specifically  Cancer screening and Immunization.   Questions or concerns regarding consultations or procedures which the PT has had in the interim are  Addressed.Followed by pain clinicThe PT denies any adverse reactions to current medications since the last visit. Also continues to be treated by psychiatry C/o "cyst" in upper back from which pain radiates down the right arm for past several weeks, denies new weakness of arm and no inciting trauma Blood sugar when tested is reportedly under 200 ROS Denies recent fever or chills. Denies sinus pressure, nasal congestion, ear pain or sore throat. Denies chest congestion, productive cough or wheezing. Denies chest pains, palpitations and leg swelling Denies abdominal pain, nausea, vomiting,diarrhea or constipation.   Denies dysuria, frequency, hesitancy or incontinence.  Denies headaches, seizures,  Denies depression, uncontrolled anxiety or insomnia. Denies skin break down or rash.   PE  BP (!) 170/80   Pulse 74   Resp 16   Ht 5\' 9"  (1.753 m)   Wt 216 lb (98 kg)   SpO2 95%   BMI 31.90 kg/m   Patient alert and oriented and in no cardiopulmonary distress.  HEENT: No facial asymmetry, EOMI,   oropharynx pink and moist.  Neck decreased ROM no JVD, no mass.  Chest: decreased air entry bilaterally, no wheezes or crackles  CVS: S1, S2 no murmurs, no S3.Regular rate.  ABD: Soft non tender.   Ext: No edema  MS: Decreased  ROM thoraco lumbar  spine,  Palpable spasm in right paraspinal thoracic spine area adequate ROM shoulders, hips and knees.  Skin: Intact, no ulcerations or rash noted.no mass palpable in area of concern, pt has muscle spasm  Psych: Good eye contact, normal  affect. Memory intact not anxious or depressed appearing.  CNS: CN 2-12 intact, power,  normal throughout.no focal deficits noted.   Assessment & Plan  Acute right-sided thoracic back pain  Uncontrolled.Toradol and depo medrol administered IM in the office , to be followed by a short course of oral prednisone  X ray of T spine today.   Diabetes mellitus type 2 in obese [facility]) Mark Walls is reminded of the importance of commitment to daily physical activity for 30 minutes or more, as able and the need to limit carbohydrate intake to 30 to 60 grams per meal to help with blood sugar control.   The need to take medication as prescribed, test blood sugar as directed, and to call between visits if there is a concern that blood sugar is uncontrolled is also discussed.   Mark Walls is reminded of the importance of daily foot exam, annual eye examination, and good blood sugar, blood pressure and cholesterol control. Controlled when last tested , updated lab due   Diabetic Labs Latest Ref Rng & Units 10/30/2017 06/12/2017 12/27/2016 07/28/2016 04/19/2016  HbA1c <5.7 % of total Hgb 7.2(H) 6.8(H) 7.0(H) 6.0(H) -  Microalbumin Not estab mg/dL - - - 1.0 1.0  Micro/Creat Ratio <30 mcg/mg creat - - - 11 4  Chol <200 mg/dL 166(A) 630(Z) 601(U) 932(T) -  HDL >40 mg/dL 41 42 55(D) 45 -  Calc LDL mg/dL (calc) 322(G) 254(Y) 706(C) 139(H) -  Triglycerides <150 mg/dL 376(E) 90 831(D) 176 -  Creatinine  0.70 - 1.25 mg/dL 1.61 0.96 0.45 4.09 -   BP/Weight 02/25/2018 10/17/2017 05/22/2017 02/12/2017 12/27/2016 10/17/2016 09/20/2016  Systolic BP 170 160 130 120 130 153 -  Diastolic BP 80 80 74 80 72 79 -  Wt. (Lbs) 216 213 206 215 210 209 202  BMI 31.9 31.45 30.42 31.75 31.01 31.32 31.64   Foot/eye exam completion dates Latest Ref Rng & Units 02/25/2018 12/27/2016  Eye Exam No Retinopathy - -  Foot Form Completion - Done Done        Hyperlipidemia LDL goal <100 Hyperlipidemia:Low fat diet discussed and  encouraged.   Lipid Panel  Lab Results  Component Value Date   CHOL 234 (H) 10/30/2017   HDL 41 10/30/2017   LDLCALC 155 (H) 10/30/2017   TRIG 223 (H) 10/30/2017   CHOLHDL 5.7 (H) 10/30/2017  uncontrolled Updated lab needed at/ before next visit.    '  Essential hypertension Uncontrolled,increase dose of cozaar DASH diet and commitment to daily physical activity for a minimum of 30 minutes discussed and encouraged, as a part of hypertension management. The importance of attaining a healthy weight is also discussed.  BP/Weight 02/25/2018 10/17/2017 05/22/2017 02/12/2017 12/27/2016 10/17/2016 09/20/2016  Systolic BP 170 160 130 120 130 153 -  Diastolic BP 80 80 74 80 72 79 -  Wt. (Lbs) 216 213 206 215 210 209 202  BMI 31.9 31.45 30.42 31.75 31.01 31.32 31.64

## 2018-04-15 ENCOUNTER — Other Ambulatory Visit: Payer: Self-pay | Admitting: Family Medicine

## 2018-06-11 ENCOUNTER — Encounter: Payer: Medicaid Other | Admitting: Family Medicine

## 2018-06-12 ENCOUNTER — Other Ambulatory Visit: Payer: Self-pay | Admitting: Family Medicine

## 2018-06-12 DIAGNOSIS — J3089 Other allergic rhinitis: Secondary | ICD-10-CM

## 2018-07-04 ENCOUNTER — Other Ambulatory Visit (HOSPITAL_COMMUNITY)
Admission: RE | Admit: 2018-07-04 | Discharge: 2018-07-04 | Disposition: A | Discharge status: Home / Self Care | Payer: Medicaid Other | Source: Ambulatory Visit | Attending: Family Medicine | Admitting: Family Medicine

## 2018-07-04 ENCOUNTER — Ambulatory Visit (INDEPENDENT_AMBULATORY_CARE_PROVIDER_SITE_OTHER): Payer: Medicaid Other | Admitting: Family Medicine

## 2018-07-04 ENCOUNTER — Encounter: Payer: Self-pay | Admitting: Family Medicine

## 2018-07-04 ENCOUNTER — Other Ambulatory Visit: Payer: Self-pay

## 2018-07-04 VITALS — BP 160/90 | HR 69 | Ht 67.0 in | Wt 213.0 lb

## 2018-07-04 DIAGNOSIS — E785 Hyperlipidemia, unspecified: Secondary | ICD-10-CM | POA: Diagnosis present

## 2018-07-04 DIAGNOSIS — Z0000 Encounter for general adult medical examination without abnormal findings: Secondary | ICD-10-CM | POA: Diagnosis not present

## 2018-07-04 DIAGNOSIS — E1169 Type 2 diabetes mellitus with other specified complication: Secondary | ICD-10-CM | POA: Insufficient documentation

## 2018-07-04 DIAGNOSIS — F32A Depression, unspecified: Secondary | ICD-10-CM

## 2018-07-04 DIAGNOSIS — E1065 Type 1 diabetes mellitus with hyperglycemia: Secondary | ICD-10-CM

## 2018-07-04 DIAGNOSIS — I10 Essential (primary) hypertension: Secondary | ICD-10-CM

## 2018-07-04 DIAGNOSIS — E669 Obesity, unspecified: Secondary | ICD-10-CM

## 2018-07-04 DIAGNOSIS — F329 Major depressive disorder, single episode, unspecified: Secondary | ICD-10-CM

## 2018-07-04 LAB — COMPREHENSIVE METABOLIC PANEL
ALK PHOS: 71 U/L (ref 38–126)
ALT: 120 U/L — AB (ref 0–44)
AST: 45 U/L — ABNORMAL HIGH (ref 15–41)
Albumin: 3.8 g/dL (ref 3.5–5.0)
Anion gap: 8 (ref 5–15)
BILIRUBIN TOTAL: 0.9 mg/dL (ref 0.3–1.2)
BUN: 17 mg/dL (ref 8–23)
CALCIUM: 9.8 mg/dL (ref 8.9–10.3)
CO2: 26 mmol/L (ref 22–32)
CREATININE: 0.87 mg/dL (ref 0.61–1.24)
Chloride: 105 mmol/L (ref 98–111)
GFR calc non Af Amer: >60 mL/min (ref >60)
Glucose, Bld: 203 mg/dL — ABNORMAL HIGH (ref 70–99)
Potassium: 4.5 mmol/L (ref 3.5–5.1)
Sodium: 139 mmol/L (ref 135–145)
Total Protein: 7.2 g/dL (ref 6.5–8.1)

## 2018-07-04 LAB — LIPID PANEL
Cholesterol: 219 mg/dL — ABNORMAL HIGH (ref 0–200)
HDL: 42 mg/dL (ref >40)
LDL Cholesterol: 143 mg/dL — ABNORMAL HIGH (ref 0–99)
TRIGLYCERIDES: 172 mg/dL — AB (ref <150)
Total CHOL/HDL Ratio: 5.2 RATIO
VLDL: 34 mg/dL (ref 0–40)

## 2018-07-04 LAB — HEMOGLOBIN A1C
Hgb A1c MFr Bld: 9.2 % — ABNORMAL HIGH (ref 4.8–5.6)
Mean Plasma Glucose: 217.34 mg/dL

## 2018-07-04 MED ORDER — ACCU-CHEK AVIVA PLUS W/DEVICE KIT
PACK | 0 refills | Status: DC
Start: 2018-07-04 — End: 2023-03-06

## 2018-07-04 MED ORDER — INSULIN GLARGINE 100 UNIT/ML SOLOSTAR PEN: PEN_INJECTOR | SUBCUTANEOUS | 99 refills | Status: DC

## 2018-07-04 NOTE — Patient Instructions (Signed)
F/U in 6 to 7 . weeks, with your diabetic log sheet, call in if you need before  Take full \ dose of cozaar 100 mg one daily  New additional med for sugar is lantus 10 units at bedtime and change how you eat  You are referred to diabetic educator , the office will call you    Test and record blood sugar 3 times daily  Before breakfast goal is 80 to 130  2 hours after lunch and last thing at night / bedtime : 130 to 180

## 2018-07-05 LAB — MICROALBUMIN, URINE: MICROALB UR: 0.5 mg/dL

## 2018-07-06 ENCOUNTER — Encounter: Payer: Self-pay | Admitting: Family Medicine

## 2018-07-06 MED ORDER — GLIPIZIDE ER 10 MG PO TB24: ORAL_TABLET | ORAL | 5 refills | Status: DC

## 2018-07-06 MED ORDER — SITAGLIPTIN PHOS-METFORMIN HCL 50-1000 MG PO TABS: ORAL_TABLET | ORAL | 5 refills | Status: DC

## 2018-07-06 NOTE — Progress Notes (Signed)
Mark Walls     MRN: [id number]      DOB: [birth year]   HPI: Patient is in for annual physical exam. His uncontrolled diabetes now requiring insulin as well as his uncontrolled hypertension are addressed. His depression will be addressed at the next visit, he is not suicidal or homicidal Recent labs,  are reviewed. Immunization is reviewed , and  updated if needed.    PE; BP (!) 160/90   Pulse 69   Ht 5\' 7"  (1.702 m)   Wt 213 lb (96.6 kg)   SpO2 95%   BMI 33.36 kg/m  Pleasant male, alert and oriented x 3, in no cardio-pulmonary distress. Afebrile. HEENT No facial trauma or asymetry. Sinuses non tender. EOMI,ranes clear. Oropharynx moist, no exudate.Poor dentition Neck: supple, no adenopathy,JVD or thyromegaly.No bruits.  Chest: Clear to ascultation bilaterally.No crackles or wheezes. Non tender to palpation  Breast: No asymetry,no masses. No nipple discharge or inversion. No axillary or supraclavicular adenopathy  Cardiovascular system; Heart sounds normal,  S1 and  S2 ,no S3.  No murmur, or thrill. Apical beat not displaced Peripheral pulses normal.  Abdomen: Soft, non tender, no organomegaly or masses. No bruits. Bowel sounds normal. No guarding, tenderness or rebound.     Musculoskeletal exam: Decreased though adequate  ROM of spine, hips , shoulders and knees. No deformity ,swelling or crepitus noted. No muscle wasting or atrophy.   Neurologic: Cranial nerves 2 to 12 intact. Power, tone ,sensation  normal throughout. No disturbance in gait. No tremor.  Skin: Intact, no ulceration, erythema , scaling or rash noted. Pigmentation normal throughout  Psych; Normal mood and affect. Judgement and concentration normal   Assessment & Plan:  Annual physical exam Annual exam as documented. Counseling done  re healthy lifestyle involving commitment to 150 minutes exercise per week, heart healthy diet, and attaining healthy weight.The importance of  adequate sleep also discussed. Regular seat belt use and home safety, is also discussed. Changes in health habits are decided on by the patient with goals and time frames  set for achieving them. Immunization and cancer screening needs are specifically addressed at this visit.   Diabetes mellitus, insulin dependent (IDDM), uncontrolled (HCC) Deteriorated , needs to start insulin [name] is reminded of the importance of commitment to daily physical activity for 30 minutes or more, as able and the need to limit carbohydrate intake to 30 to 60 grams per meal to help with blood sugar control.   The need to take medication as prescribed, test blood sugar as directed, and to call between visits if there is a concern that blood sugar is uncontrolled is also discussed.   [name] is reminded of the importance of daily foot exam, annual eye examination, and good blood sugar, blood pressure and cholesterol control.  Diabetic Labs Latest Ref Rng & Units 07/04/2018 10/30/2017 06/12/2017 12/27/2016 07/28/2016  HbA1c 4.8 - 5.6 % 9.2(H) 7.2(H) 6.8(H) 7.0(H) 6.0(H)  Microalbumin mg/dL 0.5 - - - 1.0  Micro/Creat Ratio <30 mcg/mg creat - - - - 11  Chol 0 - 200 mg/dL 536(U) 440(H) 474(Q) 595(G) 208(H)  HDL >40 mg/dL 42 41 42 38(V) 45  Calc LDL 0 - 99 mg/dL 564(P) 329(J) 188(C) 166(A) 139(H)  Triglycerides <150 mg/dL 630(Z) 601(U) 90 932(T) 119  Creatinine 0.61 - 1.24 mg/dL 5.57 3.22 0.25 4.27 0.62   BP/Weight 07/04/2018 02/25/2018 10/17/2017 05/22/2017 02/12/2017 12/27/2016 10/17/2016  Systolic BP 160 170 160 130 120 130 153  Diastolic BP 90 80  80 74 80 72 79  Wt. (Lbs) 213 216 213 206 215 210 209  BMI 33.36 31.9 31.45 30.42 31.75 31.01 31.32   Foot/eye exam completion dates Latest Ref Rng & Units 02/25/2018 12/27/2016  Eye Exam No Retinopathy - -  Foot Form Completion - Done Done   Return in 6 weeks with log, refer to diabetic educator,pt educate as to administration of insulin      Essential  hypertension Uncontrolled, non compliant DASH diet and commitment to daily physical activity for a minimum of 30 minutes discussed and encouraged, as a part of hypertension management. The importance of attaining a healthy weight is also discussed.  BP/Weight 07/04/2018 02/25/2018 10/17/2017 05/22/2017 02/12/2017 12/27/2016 10/17/2016  Systolic BP 160 170 160 130 120 130 153  Diastolic BP 90 80 80 74 80 72 79  Wt. (Lbs) 213 216 213 206 215 210 209  BMI 33.36 31.9 31.45 30.42 31.75 31.01 31.32       Obesity (BMI 30.0-34.9) Unchanged. Patient re-educated about  the importance of commitment to a  minimum of 150 minutes of exercise per week.  The importance of healthy food choices with portion control discussed. Encouraged to start a food diary, count calories and to consider  joining a support group. Sample diet sheets offered. Goals set by the patient for the next several months.   Weight /BMI 07/04/2018 02/25/2018 10/17/2017  WEIGHT 213 lb 216 lb 213 lb  HEIGHT 5\' 7"  5\' 9"  5\' 9"   BMI 33.36 kg/m2 31.9 kg/m2 31.45 kg/m2      Depression pHQ 9 score of 13, not suicidal or homicidal, will add res further at next visit

## 2018-07-06 NOTE — Assessment & Plan Note (Addendum)
Unchanged. Patient re-educated about  the importance of commitment to a  minimum of 150 minutes of exercise per week.  The importance of healthy food choices with portion control discussed. Encouraged to start a food diary, count calories and to consider  joining a support group. Sample diet sheets offered. Goals set by the patient for the next several months.   Weight /BMI 07/04/2018 02/25/2018 10/17/2017  WEIGHT 213 lb 216 lb 213 lb  HEIGHT 5\' 7"  5\' 9"  5\' 9"   BMI 33.36 kg/m2 31.9 kg/m2 31.45 kg/m2

## 2018-07-06 NOTE — Assessment & Plan Note (Addendum)
Deteriorated , needs to start insulin Mark Walls is reminded of the importance of commitment to daily physical activity for 30 minutes or more, as able and the need to limit carbohydrate intake to 30 to 60 grams per meal to help with blood sugar control.   The need to take medication as prescribed, test blood sugar as directed, and to call between visits if there is a concern that blood sugar is uncontrolled is also discussed.   Mark Walls is reminded of the importance of daily foot exam, annual eye examination, and good blood sugar, blood pressure and cholesterol control.  Diabetic Labs Latest Ref Rng & Units 07/04/2018 10/30/2017 06/12/2017 12/27/2016 07/28/2016  HbA1c 4.8 - 5.6 % 9.2(H) 7.2(H) 6.8(H) 7.0(H) 6.0(H)  Microalbumin mg/dL 0.5 - - - 1.0  Micro/Creat Ratio <30 mcg/mg creat - - - - 11  Chol 0 - 200 mg/dL 219(H) 234(H) 212(H) 246(H) 208(H)  HDL >40 mg/dL 42 41 42 37(L) 45  Calc LDL 0 - 99 mg/dL 143(H) 155(H) 152(H) 172(H) 139(H)  Triglycerides <150 mg/dL 172(H) 223(H) 90 184(H) 119  Creatinine 0.61 - 1.24 mg/dL 0.87 0.82 0.94 1.04 0.89   BP/Weight 07/04/2018 02/25/2018 10/17/2017 05/22/2017 02/12/2017 12/27/2016 31/42/7670  Systolic BP 110 034 961 164 353 912 258  Diastolic BP 90 80 80 74 80 72 79  Wt. (Lbs) 213 216 213 206 215 210 209  BMI 33.36 31.9 31.45 30.42 31.75 31.01 31.32   Foot/eye exam completion dates Latest Ref Rng & Units 02/25/2018 12/27/2016  Eye Exam No Retinopathy - -  Foot Form Completion - Done Done   Return in 6 weeks with log, refer to diabetic educator,pt educate as to administration of insulin

## 2018-07-06 NOTE — Assessment & Plan Note (Signed)
pHQ 9 score of 13, not suicidal or homicidal, will add res further at next visit

## 2018-07-06 NOTE — Assessment & Plan Note (Signed)
Uncontrolled, non compliant DASH diet and commitment to daily physical activity for a minimum of 30 minutes discussed and encouraged, as a part of hypertension management. The importance of attaining a healthy weight is also discussed.  BP/Weight 07/04/2018 02/25/2018 10/17/2017 05/22/2017 02/12/2017 12/27/2016 27/61/8485  Systolic BP 927 639 432 003 794 446 190  Diastolic BP 90 80 80 74 80 72 79  Wt. (Lbs) 213 216 213 206 215 210 209  BMI 33.36 31.9 31.45 30.42 31.75 31.01 31.32

## 2018-07-06 NOTE — Assessment & Plan Note (Signed)

## 2018-07-10 ENCOUNTER — Telehealth: Payer: Self-pay

## 2018-07-10 DIAGNOSIS — E1065 Type 1 diabetes mellitus with hyperglycemia: Secondary | ICD-10-CM

## 2018-07-10 NOTE — Telephone Encounter (Signed)
Diabetic education referral placed per Dr.Simpson

## 2018-07-11 ENCOUNTER — Telehealth: Payer: Self-pay

## 2018-07-11 NOTE — Telephone Encounter (Signed)
Pt states he has been taking his blood sugar meds and 10 units of insulin and his fasting glucose is still between 180-280 with the highest being 300. Wants to know if you want him to increase his insulin and by how much?

## 2018-07-11 NOTE — Telephone Encounter (Signed)
Needs to increase the insulin to 17 units once daily, call back in 1 week with the fasting blood sugar avaerage, and needs to follow the correct eating plan!

## 2018-07-15 NOTE — Telephone Encounter (Signed)
Patient aware.

## 2018-07-24 ENCOUNTER — Other Ambulatory Visit: Payer: Self-pay

## 2018-07-24 DIAGNOSIS — E1065 Type 1 diabetes mellitus with hyperglycemia: Secondary | ICD-10-CM

## 2018-07-24 MED ORDER — INSULIN GLARGINE 100 UNIT/ML SOLOSTAR PEN
PEN_INJECTOR | SUBCUTANEOUS | 3 refills | Status: DC
Start: 2018-07-24 — End: 2018-07-25

## 2018-07-25 ENCOUNTER — Telehealth: Payer: Self-pay | Admitting: Family Medicine

## 2018-07-25 ENCOUNTER — Other Ambulatory Visit: Payer: Self-pay

## 2018-07-25 DIAGNOSIS — E1065 Type 1 diabetes mellitus with hyperglycemia: Secondary | ICD-10-CM

## 2018-07-25 MED ORDER — INSULIN GLARGINE 100 UNIT/ML SOLOSTAR PEN
PEN_INJECTOR | SUBCUTANEOUS | 3 refills | Status: DC
Start: 2018-07-25 — End: 2018-12-23

## 2018-07-25 NOTE — Telephone Encounter (Signed)
This was called in as soon as I received his message yesterday.

## 2018-07-25 NOTE — Telephone Encounter (Signed)
Patient has been w/o insulin for 4 days, famiily is on the phone concerned. They are holding to speak to you.

## 2018-07-25 NOTE — Telephone Encounter (Signed)
Phone was transferred to me and I answered but nobody ever said anything. I called him back, no answer

## 2018-08-13 ENCOUNTER — Ambulatory Visit: Payer: Medicaid Other | Admitting: Family Medicine

## 2018-09-03 ENCOUNTER — Ambulatory Visit: Payer: Medicaid Other | Admitting: Nutrition

## 2018-09-23 ENCOUNTER — Other Ambulatory Visit: Payer: Self-pay | Admitting: Family Medicine

## 2018-10-23 ENCOUNTER — Telehealth: Payer: Self-pay | Admitting: Nutrition

## 2018-10-23 ENCOUNTER — Ambulatory Visit: Payer: Medicaid Other | Admitting: Nutrition

## 2018-10-23 NOTE — Telephone Encounter (Signed)
Msg left with family member to return call to reschedule missed appt.

## 2018-10-30 ENCOUNTER — Ambulatory Visit (INDEPENDENT_AMBULATORY_CARE_PROVIDER_SITE_OTHER): Payer: Medicaid Other | Admitting: Family Medicine

## 2018-10-30 ENCOUNTER — Encounter: Payer: Self-pay | Admitting: Family Medicine

## 2018-10-30 VITALS — BP 158/60 | HR 73 | Resp 12 | Ht 67.0 in | Wt 212.1 lb

## 2018-10-30 DIAGNOSIS — E785 Hyperlipidemia, unspecified: Secondary | ICD-10-CM | POA: Diagnosis not present

## 2018-10-30 DIAGNOSIS — I10 Essential (primary) hypertension: Secondary | ICD-10-CM | POA: Diagnosis not present

## 2018-10-30 DIAGNOSIS — E669 Obesity, unspecified: Secondary | ICD-10-CM

## 2018-10-30 DIAGNOSIS — E1140 Type 2 diabetes mellitus with diabetic neuropathy, unspecified: Secondary | ICD-10-CM | POA: Diagnosis not present

## 2018-10-30 DIAGNOSIS — IMO0001 Reserved for inherently not codable concepts without codable children: Secondary | ICD-10-CM

## 2018-10-30 DIAGNOSIS — G894 Chronic pain syndrome: Secondary | ICD-10-CM

## 2018-10-30 DIAGNOSIS — Z794 Long term (current) use of insulin: Secondary | ICD-10-CM

## 2018-10-30 DIAGNOSIS — Z23 Encounter for immunization: Secondary | ICD-10-CM

## 2018-10-30 DIAGNOSIS — Z789 Other specified health status: Secondary | ICD-10-CM

## 2018-10-30 DIAGNOSIS — Z125 Encounter for screening for malignant neoplasm of prostate: Secondary | ICD-10-CM

## 2018-10-30 MED ORDER — AMLODIPINE BESYLATE 5 MG PO TABS: 5.0000 mg | ORAL_TABLET | Freq: Every day | ORAL | 5 refills | Status: DC

## 2018-10-30 NOTE — Patient Instructions (Signed)
F/U in 3. 5 months, call if you need me before  Labs today, lipid, cmp and EGFR, HBA1C, PSA  New additional medication for blood pressure is amlodipine 5 mg once daily, continue all other meds    It is important that you exercise regularly at least 30 minutes 5 times a week. If you develop chest pain, have severe difficulty breathing, or feel very tired, stop exercising immediately and seek medical attention   Flu vaccine today

## 2018-10-31 ENCOUNTER — Other Ambulatory Visit: Payer: Self-pay | Admitting: Family Medicine

## 2018-10-31 LAB — COMPLETE METABOLIC PANEL WITH GFR
AG Ratio: 1.4 (calc) (ref 1.0–2.5)
ALKALINE PHOSPHATASE (APISO): 75 U/L (ref 40–115)
ALT: 79 U/L — ABNORMAL HIGH (ref 9–46)
AST: 36 U/L — AB (ref 10–35)
Albumin: 4.3 g/dL (ref 3.6–5.1)
BILIRUBIN TOTAL: 0.6 mg/dL (ref 0.2–1.2)
BUN: 16 mg/dL (ref 7–25)
CHLORIDE: 104 mmol/L (ref 98–110)
CO2: 26 mmol/L (ref 20–32)
CREATININE: 0.88 mg/dL (ref 0.70–1.25)
Calcium: 10.5 mg/dL — ABNORMAL HIGH (ref 8.6–10.3)
GFR, Est African American: 105 mL/min/{1.73_m2} (ref >=60)
GFR, Est Non African American: 91 mL/min/{1.73_m2} (ref >=60)
GLOBULIN: 3 g/dL (ref 1.9–3.7)
GLUCOSE: 175 mg/dL — AB (ref 65–99)
Potassium: 4.4 mmol/L (ref 3.5–5.3)
SODIUM: 139 mmol/L (ref 135–146)
Total Protein: 7.3 g/dL (ref 6.1–8.1)

## 2018-10-31 LAB — LIPID PANEL
CHOL/HDL RATIO: 5.8 (calc) — AB (ref <5.0)
Cholesterol: 251 mg/dL — ABNORMAL HIGH (ref <200)
HDL: 43 mg/dL (ref >40)
LDL Cholesterol (Calc): 166 mg/dL (calc) — ABNORMAL HIGH
Non-HDL Cholesterol (Calc): 208 mg/dL (calc) — ABNORMAL HIGH (ref <130)
Triglycerides: 258 mg/dL — ABNORMAL HIGH (ref <150)

## 2018-10-31 LAB — HEMOGLOBIN A1C
HEMOGLOBIN A1C: 7.8 %{Hb} — AB (ref <5.7)
MEAN PLASMA GLUCOSE: 177 (calc)
eAG (mmol/L): 9.8 (calc)

## 2018-10-31 LAB — PSA: PSA: 1.1 ng/mL (ref <=4.0)

## 2018-10-31 MED ORDER — EZETIMIBE 10 MG PO TABS: 10.0000 mg | ORAL_TABLET | Freq: Every day | ORAL | 5 refills | Status: DC

## 2018-11-07 ENCOUNTER — Other Ambulatory Visit: Payer: Self-pay | Admitting: Family Medicine

## 2018-11-18 ENCOUNTER — Encounter: Payer: Self-pay | Admitting: Family Medicine

## 2018-11-18 DIAGNOSIS — Z789 Other specified health status: Secondary | ICD-10-CM | POA: Insufficient documentation

## 2018-11-18 NOTE — Assessment & Plan Note (Signed)
Adequately controlled through pain clinic

## 2018-11-18 NOTE — Assessment & Plan Note (Signed)
Unchanged Patient re-educated about  the importance of commitment to a  minimum of 150 minutes of exercise per week.  The importance of healthy food choices with portion control discussed. Encouraged to start a food diary, count calories and to consider  joining a support group. Sample diet sheets offered. Goals set by the patient for the next several months.   Weight /BMI 10/30/2018 07/04/2018 02/25/2018  WEIGHT 212 lb 1.3 oz 213 lb 216 lb  HEIGHT 5\' 7"  5\' 7"  5\' 9"   BMI 33.22 kg/m2 33.36 kg/m2 31.9 kg/m2

## 2018-11-18 NOTE — Assessment & Plan Note (Signed)
Hyperlipidemia:Low fat diet discussed and encouraged.   Lipid Panel  Lab Results  Component Value Date   CHOL 251 (H) 10/30/2018   HDL 43 10/30/2018   LDLCALC 166 (H) 10/30/2018   TRIG 258 (H) 10/30/2018   CHOLHDL 5.8 (H) 10/30/2018   Uncontrolled, reports statin intolerance , needs to lower fat intake, continue zetia

## 2018-11-18 NOTE — Assessment & Plan Note (Signed)
Uncontrolled, amlodipine 5 mg started DASH diet and commitment to daily physical activity for a minimum of 30 minutes discussed and encouraged, as a part of hypertension management. The importance of attaining a healthy weight is also discussed.  BP/Weight 10/30/2018 07/04/2018 02/25/2018 10/17/2017 05/22/2017 02/12/2017 07/24/9995  Systolic BP 722 773 750 510 712 524 799  Diastolic BP 60 90 80 80 74 80 72  Wt. (Lbs) 212.08 213 216 213 206 215 210  BMI 33.22 33.36 31.9 31.45 30.42 31.75 31.01

## 2018-11-18 NOTE — Assessment & Plan Note (Signed)
Mark Walls is reminded of the importance of commitment to daily physical activity for 30 minutes or more, as able and the need to limit carbohydrate intake to 30 to 60 grams per meal to help with blood sugar control.   The need to take medication as prescribed, test blood sugar as directed, and to call between visits if there is a concern that blood sugar is uncontrolled is also discussed.   Mark Walls is reminded of the importance of daily foot exam, annual eye examination, and good blood sugar, blood pressure and cholesterol control. Improved which is very giood , needs to lower carb intake  More and increase activity, no med change Diabetic Labs Latest Ref Rng & Units 10/30/2018 07/04/2018 10/30/2017 06/12/2017 12/27/2016  HbA1c <5.7 % of total Hgb 7.8(H) 9.2(H) 7.2(H) 6.8(H) 7.0(H)  Microalbumin mg/dL - 0.5 - - -  Micro/Creat Ratio <30 mcg/mg creat - - - - -  Chol <200 mg/dL 251(H) 219(H) 234(H) 212(H) 246(H)  HDL >40 mg/dL 43 42 41 42 37(L)  Calc LDL mg/dL (calc) 166(H) 143(H) 155(H) 152(H) 172(H)  Triglycerides <150 mg/dL 258(H) 172(H) 223(H) 90 184(H)  Creatinine 0.70 - 1.25 mg/dL 0.88 0.87 0.82 0.94 1.04   BP/Weight 10/30/2018 07/04/2018 02/25/2018 10/17/2017 05/22/2017 02/12/2017 3/96/7289  Systolic BP 791 504 136 438 377 939 688  Diastolic BP 60 90 80 80 74 80 72  Wt. (Lbs) 212.08 213 216 213 206 215 210  BMI 33.22 33.36 31.9 31.45 30.42 31.75 31.01   Foot/eye exam completion dates Latest Ref Rng & Units 02/25/2018 12/27/2016  Eye Exam No Retinopathy - -  Foot Form Completion - Done Done

## 2018-11-18 NOTE — Assessment & Plan Note (Signed)
Pt has had multiple attempts at different statins , has repeatedly reported  generalized multiple cramps, therefore unable to prescribe statin

## 2018-11-18 NOTE — Progress Notes (Signed)
Mark Walls     MRN: [id number]      DOB: 25-Aug-1956   HPI Mark Walls is here for follow up and re-evaluation of chronic medical conditions, medication management and review of any available recent lab and radiology data.  Preventive health is updated, specifically  Cancer screening and Immunization.   followed by pain clinic and is satisfied The PT denies any adverse reactions to current medications since the last visit.  There are no new concerns.  There are no specific complaints   ROS Denies recent fever or chills. Denies sinus pressure, nasal congestion, ear pain or sore throat. Denies chest congestion, productive cough or wheezing. Denies chest pains, palpitations and leg swelling Denies abdominal pain, nausea, vomiting,diarrhea or constipation.   Denies dysuria, frequency, hesitancy or incontinence. Denies uncontrolled joint pain, swelling and limitation in mobility. Denies headaches, seizures, numbness, or tingling. Denies depression, anxiety or insomnia. Denies skin break down or rash.   PE  BP (!) 158/60 (BP Location: Right Arm, Patient Position: Sitting, Cuff Size: Large)   Pulse 73   Resp 12   Ht 5\' 7"  (1.702 m)   Wt 212 lb 1.3 oz (96.2 kg)   SpO2 96%   BMI 33.22 kg/m   Patient alert and oriented and in no cardiopulmonary distress.  HEENT: No facial asymmetry, EOMI,   oropharynx pink and moist.  Neck supple no JVD, no mass.  Chest: Clear to auscultation bilaterally.  CVS: S1, S2 no murmurs, no S3.Regular rate.  ABD: Soft non tender.   Ext: No edema  MS: decreased  ROM spine,  Adequate in shoulders, hips and knees.  Skin: Intact, no ulcerations or rash noted.  Psych: Good eye contact, normal affect. Memory intact not anxious or depressed appearing.  CNS: CN 2-12 intact, power,  normal throughout.no focal deficits noted.   Assessment & Plan  Essential hypertension Uncontrolled, amlodipine 5 mg started DASH diet and commitment to daily  physical activity for a minimum of 30 minutes discussed and encouraged, as a part of hypertension management. The importance of attaining a healthy weight is also discussed.  BP/Weight 10/30/2018 07/04/2018 02/25/2018 10/17/2017 05/22/2017 02/12/2017 12/27/2016  Systolic BP 158 160 170 160 130 120 130  Diastolic BP 60 90 80 80 74 80 72  Wt. (Lbs) 212.08 213 216 213 206 215 210  BMI 33.22 33.36 31.9 31.45 30.42 31.75 31.01       Obesity (BMI 30.0-34.9) Unchanged Patient re-educated about  the importance of commitment to a  minimum of 150 minutes of exercise per week.  The importance of healthy food choices with portion control discussed. Encouraged to start a food diary, count calories and to consider  joining a support group. Sample diet sheets offered. Goals set by the patient for the next several months.   Weight /BMI 10/30/2018 07/04/2018 02/25/2018  WEIGHT 212 lb 1.3 oz 213 lb 216 lb  HEIGHT 5\' 7"  5\' 7"  5\' 9"   BMI 33.22 kg/m2 33.36 kg/m2 31.9 kg/m2      Hyperlipidemia LDL goal <100 Hyperlipidemia:Low fat diet discussed and encouraged.   Lipid Panel  Lab Results  Component Value Date   CHOL 251 (H) 10/30/2018   HDL 43 10/30/2018   LDLCALC 166 (H) 10/30/2018   TRIG 258 (H) 10/30/2018   CHOLHDL 5.8 (H) 10/30/2018   Uncontrolled, reports statin intolerance , needs to lower fat intake, continue zetia    Statin intolerance Pt has had multiple attempts at different statins , has repeatedly reported  generalized multiple cramps, therefore unable to prescribe statin   Diabetes mellitus, insulin dependent (IDDM), uncontrolled (HCC) Mark Walls is reminded of the importance of commitment to daily physical activity for 30 minutes or more, as able and the need to limit carbohydrate intake to 30 to 60 grams per meal to help with blood sugar control.   The need to take medication as prescribed, test blood sugar as directed, and to call between visits if there is a concern that blood  sugar is uncontrolled is also discussed.   Mark Walls is reminded of the importance of daily foot exam, annual eye examination, and good blood sugar, blood pressure and cholesterol control. Improved which is very giood , needs to lower carb intake  More and increase activity, no med change Diabetic Labs Latest Ref Rng & Units 10/30/2018 07/04/2018 10/30/2017 06/12/2017 12/27/2016  HbA1c <5.7 % of total Hgb 7.8(H) 9.2(H) 7.2(H) 6.8(H) 7.0(H)  Microalbumin mg/dL - 0.5 - - -  Micro/Creat Ratio <30 mcg/mg creat - - - - -  Chol <200 mg/dL 413(K) 440(N) 027(O) 536(U) 246(H)  HDL >40 mg/dL 43 42 41 42 44(I)  Calc LDL mg/dL (calc) 347(Q) 259(D) 638(V) 152(H) 172(H)  Triglycerides <150 mg/dL 564(P) 329(J) 188(C) 90 184(H)  Creatinine 0.70 - 1.25 mg/dL 1.66 0.63 0.16 0.10 9.32   BP/Weight 10/30/2018 07/04/2018 02/25/2018 10/17/2017 05/22/2017 02/12/2017 12/27/2016  Systolic BP 158 160 170 160 130 120 130  Diastolic BP 60 90 80 80 74 80 72  Wt. (Lbs) 212.08 213 216 213 206 215 210  BMI 33.22 33.36 31.9 31.45 30.42 31.75 31.01   Foot/eye exam completion dates Latest Ref Rng & Units 02/25/2018 12/27/2016  Eye Exam No Retinopathy - -  Foot Form Completion - Done Done        Chronic pain syndrome Adequately controlled through pain clinic

## 2018-12-20 ENCOUNTER — Other Ambulatory Visit: Payer: Self-pay | Admitting: Family Medicine

## 2018-12-20 DIAGNOSIS — E1065 Type 1 diabetes mellitus with hyperglycemia: Secondary | ICD-10-CM

## 2019-01-18 ENCOUNTER — Other Ambulatory Visit: Payer: Self-pay | Admitting: Family Medicine

## 2019-01-18 DIAGNOSIS — E1065 Type 1 diabetes mellitus with hyperglycemia: Secondary | ICD-10-CM

## 2019-02-03 ENCOUNTER — Other Ambulatory Visit: Payer: Self-pay | Admitting: Family Medicine

## 2019-02-12 ENCOUNTER — Other Ambulatory Visit: Payer: Self-pay

## 2019-02-12 ENCOUNTER — Ambulatory Visit (INDEPENDENT_AMBULATORY_CARE_PROVIDER_SITE_OTHER): Payer: Medicaid Other | Admitting: Family Medicine

## 2019-02-12 ENCOUNTER — Encounter: Payer: Self-pay | Admitting: Family Medicine

## 2019-02-12 VITALS — BP 150/58 | HR 95 | Resp 12 | Ht 67.0 in | Wt 211.0 lb

## 2019-02-12 DIAGNOSIS — R0789 Other chest pain: Secondary | ICD-10-CM

## 2019-02-12 DIAGNOSIS — Z794 Long term (current) use of insulin: Secondary | ICD-10-CM

## 2019-02-12 DIAGNOSIS — E785 Hyperlipidemia, unspecified: Secondary | ICD-10-CM

## 2019-02-12 DIAGNOSIS — IMO0001 Reserved for inherently not codable concepts without codable children: Secondary | ICD-10-CM

## 2019-02-12 DIAGNOSIS — Z789 Other specified health status: Secondary | ICD-10-CM

## 2019-02-12 DIAGNOSIS — E1165 Type 2 diabetes mellitus with hyperglycemia: Secondary | ICD-10-CM | POA: Diagnosis not present

## 2019-02-12 DIAGNOSIS — R9431 Abnormal electrocardiogram [ECG] [EKG]: Secondary | ICD-10-CM

## 2019-02-12 DIAGNOSIS — G894 Chronic pain syndrome: Secondary | ICD-10-CM

## 2019-02-12 DIAGNOSIS — I10 Essential (primary) hypertension: Secondary | ICD-10-CM

## 2019-02-12 MED ORDER — INSULIN GLARGINE 100 UNIT/ML SOLOSTAR PEN: PEN_INJECTOR | SUBCUTANEOUS | 11 refills | Status: DC

## 2019-02-12 MED ORDER — AMLODIPINE BESYLATE 10 MG PO TABS: 10.0000 mg | ORAL_TABLET | Freq: Every day | ORAL | 5 refills | Status: DC

## 2019-02-12 NOTE — Progress Notes (Signed)
Mark Walls     MRN: [id number]      DOB: [birth year]   HPI Mark Walls is here for follow up and re-evaluation of chronic medical conditions, medication management and review of any available recent lab and radiology data.  Preventive health is updated, specifically  Cancer screening and Immunization.   Questions or concerns regarding consultations or procedures which the PT has had in the interim are  Addressed.No longer has treating psychiatrist and is trying to establish with new practice, was maintained on very high doses of stimulant in the past and c/o fatigue The PT denies any adverse reactions to current medications since the last visit.  C/o intermittent Chest pain , lighj headeness shortness of breath and exertional fatigue in the past several weeks  ROS Denies recent fever or chills. Denies sinus pressure, nasal congestion, ear pain or sore throat. Denies chest congestion, productive cough or wheezing.  Denies abdominal pain, nausea, vomiting,diarrhea or constipation.   Denies dysuria, frequency, hesitancy or incontinence. C/o chronic joint pain, swelling and limitation in mobility. Denies headaches, seizures, numbness, or tingling. C/o  depression, c/o anxiety and excessive fatigue as no longer has Psychiatrist who has treated him for years. Denies skin break down or rash.   PE  BP (!) 150/58   Pulse 95   Resp 12   Ht 5\' 7"  (1.702 m)   Wt 211 lb (95.7 kg)   SpO2 95% Comment: room air  BMI 33.05 kg/m   Patient alert and oriented and in no cardiopulmonary distress.  HEENT: No facial asymmetry, EOMI,   oropharynx pink and moist.  Neck supple no JVD, no mass.  Chest: Clear to auscultation bilaterally.  CVS: S1, S2 no murmurs, no S3.Regular rate.  ABD: Soft non tender.   Ext: No edema  MS: Adequate ROM spine, shoulders, hips and knees.  Skin: Intact, no ulcerations or rash noted.  Psych: Good eye contact, normal affect. Memory intact not anxious or  depressed appearing.  CNS: CN 2-12 intact, power,  normal throughout.no focal deficits noted.   Assessment & Plan  Essential hypertension Uncontrolled, dose increase in medication DASH diet and commitment to daily physical activity for a minimum of 30 minutes discussed and encouraged, as a part of hypertension management. The importance of attaining a healthy weight is also discussed.  BP/Weight 02/12/2019 10/30/2018 07/04/2018 02/25/2018 10/17/2017 05/22/2017 02/12/2017  Systolic BP 150 158 160 170 160 130 120  Diastolic BP 58 60 90 80 80 74 80  Wt. (Lbs) 211 212.08 213 216 213 206 215  BMI 33.05 33.22 33.36 31.9 31.45 30.42 31.75       Statin intolerance Hyperlipidemia:Low fat diet discussed and encouraged.   Lipid Panel  Lab Results  Component Value Date   CHOL 251 (H) 10/30/2018   HDL 43 10/30/2018   LDLCALC 166 (H) 10/30/2018   TRIG 258 (H) 10/30/2018   CHOLHDL 5.8 (H) 10/30/2018     Updated lab needed .   Diabetes mellitus, insulin dependent (IDDM), uncontrolled (HCC) Reports uncontrolled blood sugars, increase insulin to 20 units, written for 25 units , will await updated HBA1C Mark Walls is reminded of the importance of commitment to daily physical activity for 30 minutes or more, as able and the need to limit carbohydrate intake to 30 to 60 grams per meal to help with blood sugar control.   The need to take medication as prescribed, test blood sugar as directed, and to call between visits if there is a concern  that blood sugar is uncontrolled is also discussed.   Mark Walls is reminded of the importance of daily foot exam, annual eye examination, and good blood sugar, blood pressure and cholesterol control.  Diabetic Labs Latest Ref Rng & Units 10/30/2018 07/04/2018 10/30/2017 06/12/2017 12/27/2016  HbA1c <5.7 % of total Hgb 7.8(H) 9.2(H) 7.2(H) 6.8(H) 7.0(H)  Microalbumin mg/dL - 0.5 - - -  Micro/Creat Ratio <30 mcg/mg creat - - - - -  Chol <200 mg/dL 737(T)  062(I) 948(N) 462(V) 246(H)  HDL >40 mg/dL 43 42 41 42 03(J)  Calc LDL mg/dL (calc) 009(F) 818(E) 993(Z) 152(H) 172(H)  Triglycerides <150 mg/dL 169(C) 789(F) 810(F) 90 184(H)  Creatinine 0.70 - 1.25 mg/dL 7.51 0.25 8.52 7.78 2.42   BP/Weight 02/12/2019 10/30/2018 07/04/2018 02/25/2018 10/17/2017 05/22/2017 02/12/2017  Systolic BP 150 158 160 170 160 130 120  Diastolic BP 58 60 90 80 80 74 80  Wt. (Lbs) 211 212.08 213 216 213 206 215  BMI 33.05 33.22 33.36 31.9 31.45 30.42 31.75   Foot/eye exam completion dates Latest Ref Rng & Units 02/25/2018 12/27/2016  Eye Exam No Retinopathy - -  Foot Form Completion - Done Done      Updated lab needed    Chronic pain syndrome managed by pain clinic, registry reviewed and he  Is compliant  Chest pain Intermittent chest discomfort , with noted poor exercise tolerance and fatigue in the past several weeks. EKG: NSR, no LVH, possible prior infarct, needs re eval by Cardiology

## 2019-02-12 NOTE — Patient Instructions (Signed)
F/U in 8 weeks, call if you need me sooner  Fasting labs next week Friday, lipid, cmp and eGFR , hBA1C  You are being referred to Cardiology because of new complaints with your heart, your EKG today is not normal and suggests that you have had some damage to your heart in the past, so important you change food choice and get blood sugar , blood pressure and cholesterol controlled  Please commit to aspirin 81 mg one daily If you have new  Or recurrent chest pain or light headedness you need to go to the ED

## 2019-02-14 ENCOUNTER — Encounter: Payer: Self-pay | Admitting: Family Medicine

## 2019-02-14 NOTE — Assessment & Plan Note (Signed)
Uncontrolled, dose increase in medication DASH diet and commitment to daily physical activity for a minimum of 30 minutes discussed and encouraged, as a part of hypertension management. The importance of attaining a healthy weight is also discussed.  BP/Weight 02/12/2019 10/30/2018 07/04/2018 02/25/2018 10/17/2017 05/22/2017 9/66/4660  Systolic BP 563 729 426 270 048 498 651  Diastolic BP 58 60 90 80 80 74 80  Wt. (Lbs) 211 212.08 213 216 213 206 215  BMI 33.05 33.22 33.36 31.9 31.45 30.42 31.75

## 2019-02-14 NOTE — Assessment & Plan Note (Signed)
Intermittent chest discomfort , with noted poor exercise tolerance and fatigue in the past several weeks. EKG: NSR, no LVH, possible prior infarct, needs re eval by Cardiology

## 2019-02-14 NOTE — Assessment & Plan Note (Signed)
Hyperlipidemia:Low fat diet discussed and encouraged.   Lipid Panel  Lab Results  Component Value Date   CHOL 251 (H) 10/30/2018   HDL 43 10/30/2018   LDLCALC 166 (H) 10/30/2018   TRIG 258 (H) 10/30/2018   CHOLHDL 5.8 (H) 10/30/2018     Updated lab needed .

## 2019-02-14 NOTE — Assessment & Plan Note (Addendum)
managed by pain clinic, registry reviewed and he  Is compliant

## 2019-02-14 NOTE — Assessment & Plan Note (Signed)
Reports uncontrolled blood sugars, increase insulin to 20 units, written for 25 units , will await updated HBA1C Mark Walls is reminded of the importance of commitment to daily physical activity for 30 minutes or more, as able and the need to limit carbohydrate intake to 30 to 60 grams per meal to help with blood sugar control.   The need to take medication as prescribed, test blood sugar as directed, and to call between visits if there is a concern that blood sugar is uncontrolled is also discussed.   Mark Walls is reminded of the importance of daily foot exam, annual eye examination, and good blood sugar, blood pressure and cholesterol control.  Diabetic Labs Latest Ref Rng & Units 10/30/2018 07/04/2018 10/30/2017 06/12/2017 12/27/2016  HbA1c <5.7 % of total Hgb 7.8(H) 9.2(H) 7.2(H) 6.8(H) 7.0(H)  Microalbumin mg/dL - 0.5 - - -  Micro/Creat Ratio <30 mcg/mg creat - - - - -  Chol <200 mg/dL 251(H) 219(H) 234(H) 212(H) 246(H)  HDL >40 mg/dL 43 42 41 42 37(L)  Calc LDL mg/dL (calc) 166(H) 143(H) 155(H) 152(H) 172(H)  Triglycerides <150 mg/dL 258(H) 172(H) 223(H) 90 184(H)  Creatinine 0.70 - 1.25 mg/dL 0.88 0.87 0.82 0.94 1.04   BP/Weight 02/12/2019 10/30/2018 07/04/2018 02/25/2018 10/17/2017 05/22/2017 3/61/2244  Systolic BP 975 300 511 021 117 356 701  Diastolic BP 58 60 90 80 80 74 80  Wt. (Lbs) 211 212.08 213 216 213 206 215  BMI 33.05 33.22 33.36 31.9 31.45 30.42 31.75   Foot/eye exam completion dates Latest Ref Rng & Units 02/25/2018 12/27/2016  Eye Exam No Retinopathy - -  Foot Form Completion - Done Done      Updated lab needed

## 2019-02-22 LAB — COMPLETE METABOLIC PANEL WITH GFR
AG RATIO: 1.5 (calc) (ref 1.0–2.5)
ALT: 108 U/L — AB (ref 9–46)
AST: 39 U/L — ABNORMAL HIGH (ref 10–35)
Albumin: 4.3 g/dL (ref 3.6–5.1)
Alkaline phosphatase (APISO): 65 U/L (ref 35–144)
BUN: 13 mg/dL (ref 7–25)
CO2: 30 mmol/L (ref 20–32)
Calcium: 10.3 mg/dL (ref 8.6–10.3)
Chloride: 101 mmol/L (ref 98–110)
Creat: 0.93 mg/dL (ref 0.70–1.25)
GFR, Est African American: 100 mL/min/{1.73_m2} (ref >=60)
GFR, Est Non African American: 86 mL/min/{1.73_m2} (ref >=60)
GLOBULIN: 2.8 g/dL (ref 1.9–3.7)
Glucose, Bld: 198 mg/dL — ABNORMAL HIGH (ref 65–99)
Potassium: 5.1 mmol/L (ref 3.5–5.3)
Sodium: 138 mmol/L (ref 135–146)
Total Bilirubin: 0.6 mg/dL (ref 0.2–1.2)
Total Protein: 7.1 g/dL (ref 6.1–8.1)

## 2019-02-22 LAB — HEMOGLOBIN A1C
HEMOGLOBIN A1C: 8.5 %{Hb} — AB (ref <5.7)
MEAN PLASMA GLUCOSE: 197 (calc)
eAG (mmol/L): 10.9 (calc)

## 2019-02-22 LAB — LIPID PANEL
Cholesterol: 245 mg/dL — ABNORMAL HIGH (ref <200)
HDL: 40 mg/dL (ref >=40)
LDL Cholesterol (Calc): 175 mg/dL (calc) — ABNORMAL HIGH
Non-HDL Cholesterol (Calc): 205 mg/dL (calc) — ABNORMAL HIGH (ref <130)
Total CHOL/HDL Ratio: 6.1 (calc) — ABNORMAL HIGH (ref <5.0)
Triglycerides: 152 mg/dL — ABNORMAL HIGH (ref <150)

## 2019-02-24 ENCOUNTER — Other Ambulatory Visit: Payer: Self-pay | Admitting: Family Medicine

## 2019-02-24 DIAGNOSIS — E785 Hyperlipidemia, unspecified: Principal | ICD-10-CM

## 2019-02-24 DIAGNOSIS — E1169 Type 2 diabetes mellitus with other specified complication: Secondary | ICD-10-CM

## 2019-02-24 DIAGNOSIS — R740 Nonspecific elevation of levels of transaminase and lactic acid dehydrogenase [LDH]: Secondary | ICD-10-CM

## 2019-02-24 DIAGNOSIS — R7401 Elevation of levels of liver transaminase levels: Secondary | ICD-10-CM

## 2019-02-24 NOTE — Progress Notes (Signed)
amb card

## 2019-02-25 ENCOUNTER — Encounter: Payer: Self-pay | Admitting: Internal Medicine

## 2019-02-25 ENCOUNTER — Other Ambulatory Visit: Payer: Self-pay

## 2019-02-25 MED ORDER — INSULIN GLARGINE 100 UNIT/ML SOLOSTAR PEN: PEN_INJECTOR | SUBCUTANEOUS | 11 refills | Status: DC

## 2019-02-25 MED ORDER — CLOTRIMAZOLE-BETAMETHASONE 1-0.05 % EX LOTN: 1.0000 "application " | TOPICAL_LOTION | Freq: Two times a day (BID) | CUTANEOUS | 0 refills | Status: DC

## 2019-03-03 ENCOUNTER — Other Ambulatory Visit: Payer: Self-pay

## 2019-03-03 ENCOUNTER — Telehealth: Payer: Self-pay | Admitting: Family Medicine

## 2019-03-03 MED ORDER — CLOTRIMAZOLE-BETAMETHASONE 1-0.05 % EX CREA: 1.0000 "application " | TOPICAL_CREAM | Freq: Two times a day (BID) | CUTANEOUS | 0 refills | Status: DC

## 2019-03-03 NOTE — Telephone Encounter (Signed)
Pts wife is calling, states she has called Numerous times, to get this cream for her husband order, and the nurse is not calling it in.  Please advise

## 2019-03-03 NOTE — Telephone Encounter (Signed)
Med needs PA

## 2019-03-10 ENCOUNTER — Other Ambulatory Visit: Payer: Self-pay | Admitting: Family Medicine

## 2019-03-10 ENCOUNTER — Telehealth: Payer: Self-pay | Admitting: Family Medicine

## 2019-03-10 DIAGNOSIS — F909 Attention-deficit hyperactivity disorder, unspecified type: Secondary | ICD-10-CM

## 2019-03-10 NOTE — Telephone Encounter (Signed)
Pt LVM to have a nurse call him

## 2019-03-10 NOTE — Telephone Encounter (Signed)
Wife states Kenton states you told him to go to a place near the [facility] to see about getting his adderall. He didn't know where you said go and she is wanting to know exactly where you were talking about so he can get his meds. Please advise

## 2019-03-24 ENCOUNTER — Other Ambulatory Visit: Payer: Self-pay

## 2019-03-24 ENCOUNTER — Encounter: Payer: Self-pay | Admitting: Cardiology

## 2019-03-24 ENCOUNTER — Telehealth (INDEPENDENT_AMBULATORY_CARE_PROVIDER_SITE_OTHER): Payer: Medicaid Other | Admitting: Cardiology

## 2019-03-24 VITALS — BP 160/65 | HR 85 | Ht 67.0 in | Wt 208.0 lb

## 2019-03-24 DIAGNOSIS — I10 Essential (primary) hypertension: Secondary | ICD-10-CM | POA: Diagnosis not present

## 2019-03-24 DIAGNOSIS — R0789 Other chest pain: Secondary | ICD-10-CM | POA: Diagnosis not present

## 2019-03-24 DIAGNOSIS — E782 Mixed hyperlipidemia: Secondary | ICD-10-CM

## 2019-03-24 DIAGNOSIS — I2510 Atherosclerotic heart disease of native coronary artery without angina pectoris: Secondary | ICD-10-CM | POA: Diagnosis not present

## 2019-03-24 MED ORDER — CHLORTHALIDONE 25 MG PO TABS: 12.5000 mg | ORAL_TABLET | Freq: Every day | ORAL | 1 refills | Status: DC

## 2019-03-24 MED ORDER — EVOLOCUMAB 140 MG/ML ~~LOC~~ SOAJ: 140.0000 mg | SUBCUTANEOUS | 3 refills | Status: DC

## 2019-03-24 NOTE — Addendum Note (Signed)
Addended by: Julian Hy T on: 03/24/2019 12:03 PM   Modules accepted: Orders

## 2019-03-24 NOTE — Patient Instructions (Signed)
Your physician recommends that you schedule a follow-up appointment in: [place] has recommended you make the following change in your medication:   START CHLORTHALIDONE 12.5 MG (1/2 TABLET) DAILY   WE WILL CHECK ON [place]   Your physician recommends that you return for lab work IN 3 WEEKS BMP/MG  LAB WORK IN 2 MONTHS CMP/LIPIDS - PLEASE FAST 6-8 HOURS PRIOR TO LAB WORK   Thank you for choosing [facility]!!

## 2019-03-24 NOTE — Progress Notes (Signed)
Virtual Visit via Telephone Note   This visit type was conducted due to national recommendations for restrictions regarding the COVID-19 Pandemic (e.g. social distancing) in an effort to limit this patient's exposure and mitigate transmission in our community.  Due to his co-morbid illnesses, this patient is at least at moderate risk for complications without adequate follow up.  This format is felt to be most appropriate for this patient at this time.  The patient did not have access to video technology/had technical difficulties with video requiring transitioning to audio format only (telephone).  All issues noted in this document were discussed and addressed.  No physical exam could be performed with this format.  Please refer to the patient's chart for his  consent to telehealth for [facility].  Evaluation Performed:  Follow-up visit  Date:  03/24/2019   ID:  Mark Walls, DOB 1956-06-07, MRN [id number]  Patient Location: Home  Provider Location: Office  PCP:  Kerri Perches, MD  Cardiologist:  Dr Dina Rich MD Electrophysiologist:  None   Chief Complaint:  Chest pain  History of Present Illness:    Mark Walls is a 63 y.o. male who presents via audio/video conferencing for a telehealth visit today.  Last seen over 3 years ago in 01/2016, no showed for last appointment.    1. Chest pain  - long history of chest pain described last visit, mixed in characteristics for cardiac chest pain -01/2014 Lexiscan MPI, moderate sized inferior defect with mild reversibility, LVEF 63%, overall low risk. This has been managed medically thus far   01/2019 pcp EKG SR, old inferior Qwaves. Nonspecific lateral ST/T changes - episodes of chest pain 4-5 months ago, overall improved - sharp pain right, into chest midchest. Could occur at rest or activity, 10/10 in severity. Not positional, would last about 1 minute. Would have some nausea. - some SOB at times. Sedentary lifestyle  due to arthritis.  - previous pain upper back into right arm and shoulder.   2. Hyperlipidemia  - elevated LFTs, fatty liver per PCP notes. Statins also cause some fatigue. Currently on zetia 10mg  daily.   - compliant with zetia.  - Jan 2020 TC 245 HDL 40 TG 152 LDL 175  3. HTN  - home SBP in 160s   4. PAD  - prior stent placed 2005, right common iliac  - has had interrmittent nonclaudication like leg pain. Normal ABIs 01/2014 - MRI has shown lumbar disease and foraminal impingement, potential etiology  5. COPD     The patient does not have symptoms concerning for COVID-19 infection (fever, chills, cough, or new shortness of breath).    Past Medical History:  Diagnosis Date  . Anxiety   . Chronic fatigue   . Depression   . Diabetes mellitus, type 2 (HCC)   . ED (erectile dysfunction)   . Hyperlipidemia   . Hypertension   . Nicotine addiction   . Obesity   . PVD (peripheral vascular disease) (HCC)    Past Surgical History:  Procedure Laterality Date  . CHOLECYSTECTOMY    . COLONOSCOPY  10/01/2002   [name]:small internal hemorrhoids/single small sessile polyp in the rectum/ otherwise normal. Hyperplastic polyp.  . COLONOSCOPY WITH ESOPHAGOGASTRODUODENOSCOPY (EGD) N/A 08/19/2013   AOZ:HYQMVHQI EG junction. Hiatal hernia. Abnormal gastric mucosa s/p bx: Internal hemorrhoids;  Otherwise, normal ileocolonoscopy - Status post segmental biopsy  . ESOPHAGOGASTRODUODENOSCOPY  09/24/2002   [name]:gastritis of the antrum and the bodt of the stomach otherwise normal  .  left hand    . left knee athroscopy  03/2008  . polpectomy colon    . right common iliac artery stenting    . right elbow    . ROTATOR CUFF REPAIR  09/01/08     No outpatient medications have been marked as taking for the 03/24/19 encounter (Appointment) with Antoine Poche, MD.     Allergies:   Allopurinol; Levofloxacin; Statins; Sulfamethoxazole-trimethoprim; and Codeine   Social History   Tobacco  Use  . Smoking status: Former Smoker    Packs/day: 1.00    Years: 40.00    Pack years: 40.00    Types: Cigarettes    Start date: 09/16/1970    Last attempt to quit: 11/17/2013    Years since quitting: 5.3  . Smokeless tobacco: Never Used  Substance Use Topics  . Alcohol use: No    Alcohol/week: 0.0 standard drinks  . Drug use: No     Family Hx: The patient's family history includes Cirrhosis in his mother; Colon cancer in his paternal grandfather; Diabetes in his brother and brother; Hypertension in his brother and brother; Melanoma in his brother and mother; Mental illness in his brother, brother, and father.  ROS:   Please see the history of present illness.     All other systems reviewed and are negative.   Prior CV studies:   The following studies were reviewed today:  11/2013 CXR  No abnormality   11/2013 Labs: Cr 0.8 K 4.4 Hgb 13.9 Plt 214   2005 Cath  FINDINGS:  1. LV: 136/10/19. EF 65% without regional wall motion abnormal.  2. No aortic stenosis or mitral regurgitation.  3. Left main: Angiographically normal.  4. LAD: Moderate-sized vessel giving rise to three diagonal branches.  First two diagonals are quite small and the third is larger. The LAD  and its branches are normal.  5. Circumflex: Large, codominant vessel giving rise of two obtuse  marginals in the PDA. It is angiographically normal. 6. RCA: Moderate-  sized, codominant vessel. It is angiographically normal.  6. Abdominal aorta: Normal abdominal aorta. It gives rise to single renal  arteries bilaterally. Both of these are normal. 8. Right leg: Severe  common iliac stenosis with peak gradient of 40 mmHg and a mean gradient  of 15 mmHg at rest. The remainder of the common iliac as well as the  internal iliac, external iliac, common femoral, and proximal portions of  the SFA and profunda are normal.  7. Left leg: There is a mild stenosis of the common iliac artery   (approximately 20%). The remainder of the common as well as the  internal iliac, external iliac, common femoral and proximal portion of  the SFA is normal. There is an approximately 50% stenosis of the  proximal profunda.  IMPRESSION/RECOMMENDATIONS:  1. Angiographically normal coronary arteries with normal LV size and  systolic function. No aortic stenosis or mitral regurgitation. I  suspect a non-cardiac etiology to his chest pain. Despite these  negative findings, his atherosclerotic peripheral vascular disease  places him at high risk for subsequent coronary event. Thus, aggressive  secondary prevention efforts should be continued.  2. 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 Stenting is recommended.   01/21/14 Lexiscan Nuclear Stress Test  Tomographic views were obtained using the short axis, vertical long axis, and horizontal long axis planes. There was a moderate-sized, dense, inferior defect from apical to mid level, partial reversibility noted from mid to basal level. This is  most suggestive of inferior scar with peri-infarct ischemia of mild degree in the mid to basal segment.  Gated imaging revealed an EDV of 82, ESV of 31, TID ratio 0.9, and LVEF of 63% without obvious wall motion abnormalities.  IMPRESSION: Abnormal, but relatively low risk Lexiscan Cardiolite as outlined. Patient was not able to ambulate on the treadmill to achieve adequate heart rate response due to shortness of breath and probable claudication. There were no diagnostic ST segment changes with Lexiscan. Perfusion imaging is most consistent with inferior wall scar and mild peri-infarct ischemia in the mid to basal segment. There is however normal wall motion in this region on gated imaging with LVEF 63% and normal LV volumes. Suggests RCA distribution ischemia.  01/2014 ABI  Right 1.1 Left 1.1   2/15 Echo  LVEF 55-60%, grade I diastolic  dysfunction, technically difficult study   Labs/Other Tests and Data Reviewed:    EKG:  n/a  Recent Labs: 02/21/2019: ALT 108; BUN 13; Creat 0.93; Potassium 5.1; Sodium 138   Recent Lipid Panel Lab Results  Component Value Date/Time   CHOL 245 (H) 02/21/2019 10:16 AM   TRIG 152 (H) 02/21/2019 10:16 AM   HDL 40 02/21/2019 10:16 AM   CHOLHDL 6.1 (H) 02/21/2019 10:16 AM   LDLCALC 175 (H) 02/21/2019 10:16 AM    Wt Readings from Last 3 Encounters:  02/12/19 211 lb (95.7 kg)  10/30/18 212 lb 1.3 oz (96.2 kg)  07/04/18 213 lb (96.6 kg)     Objective:    Vital Signs:  There were no vitals taken for this visit.   Normal affect, normal speech pattern. Sounds comfortable in no acute distress  ASSESSMENT & PLAN:     1. Chest pain/CAD - previous stress MPI was low risk, suggestion of inferior scar with mild peri-infarct ischemia, normal wall motion and LVEF. EKG has chronic inferior Qwaves. CAD has not been confirmed by cath but everything would support it is present.  - somewhat atypical chest pain 4-5 months ago that has resolved - continue to monitor at this time. No indication for repeat ischemic testing at this time.   2. Hyperlipidemia - intolerant to statins, on zetia LDL in 170s - we will see if can get him either repatha or praluent, goal LDL< 70 given his PAD and very likely CAD.  - check CMET and FLP 6-8 weeks  3. HTN  - above goal start chlorthalidone 12.5mg  daily. Check BMET/Mg in 3 weeks, may be some delay given COVID-19 limitations.     F/u 3 months  COVID-19 Education: The signs and symptoms of COVID-19 were discussed with the patient and how to seek care for testing (follow up with PCP or arrange E-visit).  The importance of social distancing was discussed today.  Time:   Today, I have spent 25 minutes with the patient with telehealth technology discussing the above problems.     Medication Adjustments/Labs and Tests Ordered: Current medicines are  reviewed at length with the patient today.  Concerns regarding medicines are outlined above.  Tests Ordered: No orders of the defined types were placed in this encounter.  Medication Changes: No orders of the defined types were placed in this encounter.    Disposition:  Follow up 3 months  Signed, Dina Rich, MD  03/24/2019 8:42 AM    [facility]

## 2019-04-09 ENCOUNTER — Other Ambulatory Visit: Payer: Self-pay | Admitting: Family Medicine

## 2019-04-23 NOTE — Telephone Encounter (Signed)
I spoke with  his wife and explained the location

## 2019-04-29 ENCOUNTER — Other Ambulatory Visit: Payer: Self-pay

## 2019-04-29 ENCOUNTER — Ambulatory Visit: Payer: Medicaid Other | Admitting: Internal Medicine

## 2019-04-29 ENCOUNTER — Telehealth: Payer: Self-pay | Admitting: *Deleted

## 2019-04-29 NOTE — Telephone Encounter (Signed)
Called patient for virtual visit with RMR. He is on the road and can't talk. Patient last OV was 2014 and per JL patient would need to be "visual" for appt. Patient states to just r/s him.

## 2019-04-30 NOTE — Telephone Encounter (Signed)
Added him to August recall so he can schedule face to face with rmr

## 2019-05-07 ENCOUNTER — Ambulatory Visit: Payer: Medicaid Other | Admitting: Family Medicine

## 2019-05-08 ENCOUNTER — Ambulatory Visit: Payer: Medicaid Other | Admitting: Family Medicine

## 2019-05-08 ENCOUNTER — Encounter: Payer: Self-pay | Admitting: Family Medicine

## 2019-05-08 VITALS — BP 150/58 | Ht 67.0 in | Wt 211.0 lb

## 2019-05-11 NOTE — Progress Notes (Signed)
Patient not seen, unable to connect, appt rescheduled

## 2019-05-14 ENCOUNTER — Ambulatory Visit: Payer: Medicaid Other | Admitting: Family Medicine

## 2019-05-20 ENCOUNTER — Other Ambulatory Visit: Payer: Self-pay | Admitting: Family Medicine

## 2019-05-28 ENCOUNTER — Ambulatory Visit: Payer: Medicaid Other | Admitting: Family Medicine

## 2019-05-29 ENCOUNTER — Ambulatory Visit (INDEPENDENT_AMBULATORY_CARE_PROVIDER_SITE_OTHER): Payer: Medicaid Other | Admitting: Family Medicine

## 2019-05-29 ENCOUNTER — Other Ambulatory Visit: Payer: Self-pay

## 2019-05-29 VITALS — BP 150/58 | Ht 67.0 in | Wt 211.0 lb

## 2019-05-29 DIAGNOSIS — I10 Essential (primary) hypertension: Secondary | ICD-10-CM | POA: Diagnosis not present

## 2019-05-29 DIAGNOSIS — Z794 Long term (current) use of insulin: Secondary | ICD-10-CM

## 2019-05-29 DIAGNOSIS — IMO0001 Reserved for inherently not codable concepts without codable children: Secondary | ICD-10-CM

## 2019-05-29 DIAGNOSIS — E785 Hyperlipidemia, unspecified: Secondary | ICD-10-CM

## 2019-05-29 DIAGNOSIS — E669 Obesity, unspecified: Secondary | ICD-10-CM

## 2019-05-29 DIAGNOSIS — E1165 Type 2 diabetes mellitus with hyperglycemia: Secondary | ICD-10-CM

## 2019-05-29 MED ORDER — CLOTRIMAZOLE-BETAMETHASONE 1-0.05 % EX CREA: TOPICAL_CREAM | Freq: Two times a day (BID) | CUTANEOUS | 5 refills | Status: DC

## 2019-05-29 MED ORDER — LOSARTAN POTASSIUM 100 MG PO TABS: 100.0000 mg | ORAL_TABLET | Freq: Every day | ORAL | 1 refills | Status: DC

## 2019-05-29 MED ORDER — JANUMET 50-1000 MG PO TABS: ORAL_TABLET | ORAL | 1 refills | Status: DC

## 2019-05-29 MED ORDER — GLIPIZIDE ER 10 MG PO TB24: ORAL_TABLET | ORAL | 1 refills | Status: DC

## 2019-05-29 MED ORDER — METOPROLOL TARTRATE 25 MG PO TABS: 12.5000 mg | ORAL_TABLET | Freq: Two times a day (BID) | ORAL | 1 refills | Status: DC

## 2019-05-29 MED ORDER — AMLODIPINE BESYLATE 10 MG PO TABS: 10.0000 mg | ORAL_TABLET | Freq: Every day | ORAL | 1 refills | Status: DC

## 2019-05-29 MED ORDER — EZETIMIBE 10 MG PO TABS: 10.0000 mg | ORAL_TABLET | Freq: Every day | ORAL | 1 refills | Status: DC

## 2019-05-29 NOTE — Patient Instructions (Addendum)
PLease schedule in office appt with mD in 2 weeks, call and verify with pt today as well as mail the appt info  Please get fasting lipid, cmp and eGFR, hBA1c, cBC and TSH ( [facility]) next week, very important you do this  Blood pressure is still too high, so you need to consistently take meds every day at the same time  Thanks for choosing [facility], we consider it a privelige to serve you. .Social distancing. Frequent hand washing with soap and water Keeping your hands off of your face. These 3 practices will help to keep both you and your community healthy during this time. Please practice them faithfully!

## 2019-05-29 NOTE — Progress Notes (Signed)
Virtual Visit via Telephone Note  I connected with Mark Walls on 05/29/19 at  2:40 PM EDT by telephone and verified that I am speaking with the correct person using two identifiers.  Location: Patient: daughter's home Provider: office   I discussed the limitations, risks, security and privacy concerns of performing an evaluation and management service by telephone and the availability of in person appointments. I also discussed with the patient that there may be a patient responsible charge related to this service. The patient expressed understanding and agreed to proceed. This visit type is conducted due to national recommendations for restrictions regarding the COVID -19 Pandemic. Due to the patient's age and / or co morbidities, this format is felt to be most appropriate at this time without adequate follow up. The patient has no access to video technology/ had technical difficulties with video, requiring transitioning to audio format  only ( telephone ). All issues noted this document were discussed and addressed,no physical exam can be performed in this format.    History of Present Illness:   f/u chronic problems, labs overdue and blood pressure uncontrolled, as well as his diabetes and lipids C/o chronic pain, managed by pain clinic C/o having no medication, states pharmacy does not have his meds, and he has not reached out to the office C/o varying blood sugar, often over 200 Denies recent fever or chills. Denies sinus pressure, nasal congestion, ear pain or sore throat. Denies chest congestion, productive cough or wheezing. Denies chest pains, palpitations and leg swelling Denies abdominal pain, nausea, vomiting,diarrhea or constipation.   Denies dysuria, frequency, hesitancy or incontinence. . Denies headaches, seizures, numbness, or tingling. Denies depression, anxiety or insomnia. Denies skin break down or rash.     Observations/Objective: BP (!) 150/58   Ht 5'  7" (1.702 m)   Wt 211 lb (95.7 kg)   BMI 33.05 kg/m  Good communication with no confusion and intact memory. Alert and oriented x 3 No signs of respiratory distress during speech   Assessment and Plan: Essential hypertension Uncontrolled, neds in office visit with medications, which are re sent DASH diet and commitment to daily physical activity for a minimum of 30 minutes discussed and encouraged, as a part of hypertension management. The importance of attaining a healthy weight is also discussed.  BP/Weight 05/29/2019 05/08/2019 03/24/2019 02/12/2019 10/30/2018 07/04/2018 5/00/9381  Systolic BP 829 937 169 678 938 101 751  Diastolic BP 58 58 65 58 60 90 80  Wt. (Lbs) 211 211 208 211 212.08 213 216  BMI 33.05 33.05 32.58 33.05 33.22 33.36 31.9       Diabetes mellitus, insulin dependent (IDDM), uncontrolled ([place]) Uncontrolled Updated lab needed at/ before next visit. Mark Walls is reminded of the importance of commitment to daily physical activity for 30 minutes or more, as able and the need to limit carbohydrate intake to 30 to 60 grams per meal to help with blood sugar control.   The need to take medication as prescribed, test blood sugar as directed, and to call between visits if there is a concern that blood sugar is uncontrolled is also discussed.   Mark Walls is reminded of the importance of daily foot exam, annual eye examination, and good blood sugar, blood pressure and cholesterol control.  Diabetic Labs Latest Ref Rng & Units 02/21/2019 10/30/2018 07/04/2018 10/30/2017 06/12/2017  HbA1c <5.7 % of total Hgb 8.5(H) 7.8(H) 9.2(H) 7.2(H) 6.8(H)  Microalbumin mg/dL - - 0.5 - -  Micro/Creat Ratio <30 mcg/mg  creat - - - - -  Chol <200 mg/dL 245(H) 251(H) 219(H) 234(H) 212(H)  HDL > OR = 40 mg/dL 40 43 42 41 42  Calc LDL mg/dL (calc) 175(H) 166(H) 143(H) 155(H) 152(H)  Triglycerides <150 mg/dL 152(H) 258(H) 172(H) 223(H) 90  Creatinine 0.70 - 1.25 mg/dL 0.93 0.88 0.87 0.82 0.94    BP/Weight 05/29/2019 05/08/2019 03/24/2019 02/12/2019 10/30/2018 07/04/2018 3/78/5885  Systolic BP 027 741 287 867 672 094 709  Diastolic BP 58 58 65 58 60 90 80  Wt. (Lbs) 211 211 208 211 212.08 213 216  BMI 33.05 33.05 32.58 33.05 33.22 33.36 31.9   Foot/eye exam completion dates Latest Ref Rng & Units 02/25/2018 12/27/2016  Eye Exam No Retinopathy - -  Foot Form Completion - Done Done        Obesity (BMI 30.0-34.9)  Patient re-educated about  the importance of commitment to a  minimum of 150 minutes of exercise per week as able.  The importance of healthy food choices with portion control discussed, as well as eating regularly and within a 12 hour window most days. The need to choose "clean , green" food 50 to 75% of the time is discussed, as well as to make water the primary drink and set a goal of 64 ounces water daily.    Weight /BMI 05/29/2019 05/08/2019 03/24/2019  WEIGHT 211 lb 211 lb 208 lb  HEIGHT 5\' 7"  5\' 7"  5\' 7"   BMI 33.05 kg/m2 33.05 kg/m2 32.58 kg/m2      Hyperlipidemia LDL goal <100 Hyperlipidemia:Low fat diet discussed and encouraged.   Lipid Panel  Lab Results  Component Value Date   CHOL 245 (H) 02/21/2019   HDL 40 02/21/2019   LDLCALC 175 (H) 02/21/2019   TRIG 152 (H) 02/21/2019   CHOLHDL 6.1 (H) 02/21/2019   Uncontrolled Updated lab needed at/ before next visit.       Follow Up Instructions:    I discussed the assessment and treatment plan with the patient. The patient was provided an opportunity to ask questions and all were answered. The patient agreed with the plan and demonstrated an understanding of the instructions.   The patient was advised to call back or seek an in-person evaluation if the symptoms worsen or if the condition fails to improve as anticipated.  I provided  25  minutes of non-face-to-face time during this encounter.   Tula Nakayama, MD

## 2019-05-30 ENCOUNTER — Encounter: Payer: Self-pay | Admitting: Family Medicine

## 2019-05-30 NOTE — Assessment & Plan Note (Signed)
Hyperlipidemia:Low fat diet discussed and encouraged.   Lipid Panel  Lab Results  Component Value Date   CHOL 245 (H) 02/21/2019   HDL 40 02/21/2019   LDLCALC 175 (H) 02/21/2019   TRIG 152 (H) 02/21/2019   CHOLHDL 6.1 (H) 02/21/2019   Uncontrolled Updated lab needed at/ before next visit.

## 2019-05-30 NOTE — Assessment & Plan Note (Signed)
Uncontrolled, neds in office visit with medications, which are re sent DASH diet and commitment to daily physical activity for a minimum of 30 minutes discussed and encouraged, as a part of hypertension management. The importance of attaining a healthy weight is also discussed.  BP/Weight 05/29/2019 05/08/2019 03/24/2019 02/12/2019 10/30/2018 07/04/2018 04/26/2584  Systolic BP 277 824 235 361 443 154 008  Diastolic BP 58 58 65 58 60 90 80  Wt. (Lbs) 211 211 208 211 212.08 213 216  BMI 33.05 33.05 32.58 33.05 33.22 33.36 31.9

## 2019-05-30 NOTE — Assessment & Plan Note (Signed)
  Patient re-educated about  the importance of commitment to a  minimum of 150 minutes of exercise per week as able.  The importance of healthy food choices with portion control discussed, as well as eating regularly and within a 12 hour window most days. The need to choose "clean , green" food 50 to 75% of the time is discussed, as well as to make water the primary drink and set a goal of 64 ounces water daily.    Weight /BMI 05/29/2019 05/08/2019 03/24/2019  WEIGHT 211 lb 211 lb 208 lb  HEIGHT 5\' 7"  5\' 7"  5\' 7"   BMI 33.05 kg/m2 33.05 kg/m2 32.58 kg/m2

## 2019-05-30 NOTE — Assessment & Plan Note (Signed)
Uncontrolled Updated lab needed at/ before next visit. Mr. Mark Walls is reminded of the importance of commitment to daily physical activity for 30 minutes or more, as able and the need to limit carbohydrate intake to 30 to 60 grams per meal to help with blood sugar control.   The need to take medication as prescribed, test blood sugar as directed, and to call between visits if there is a concern that blood sugar is uncontrolled is also discussed.   Mr. Mark Walls is reminded of the importance of daily foot exam, annual eye examination, and good blood sugar, blood pressure and cholesterol control.  Diabetic Labs Latest Ref Rng & Units 02/21/2019 10/30/2018 07/04/2018 10/30/2017 06/12/2017  HbA1c <5.7 % of total Hgb 8.5(H) 7.8(H) 9.2(H) 7.2(H) 6.8(H)  Microalbumin mg/dL - - 0.5 - -  Micro/Creat Ratio <30 mcg/mg creat - - - - -  Chol <200 mg/dL 245(H) 251(H) 219(H) 234(H) 212(H)  HDL > OR = 40 mg/dL 40 43 42 41 42  Calc LDL mg/dL (calc) 175(H) 166(H) 143(H) 155(H) 152(H)  Triglycerides <150 mg/dL 152(H) 258(H) 172(H) 223(H) 90  Creatinine 0.70 - 1.25 mg/dL 0.93 0.88 0.87 0.82 0.94   BP/Weight 05/29/2019 05/08/2019 03/24/2019 02/12/2019 10/30/2018 07/04/2018 2/33/0076  Systolic BP 226 333 545 625 638 937 342  Diastolic BP 58 58 65 58 60 90 80  Wt. (Lbs) 211 211 208 211 212.08 213 216  BMI 33.05 33.05 32.58 33.05 33.22 33.36 31.9   Foot/eye exam completion dates Latest Ref Rng & Units 02/25/2018 12/27/2016  Eye Exam No Retinopathy - -  Foot Form Completion - Done Done

## 2019-06-16 ENCOUNTER — Other Ambulatory Visit: Payer: Self-pay | Admitting: Family Medicine

## 2019-06-16 ENCOUNTER — Other Ambulatory Visit: Payer: Self-pay

## 2019-06-16 ENCOUNTER — Encounter: Payer: Self-pay | Admitting: Family Medicine

## 2019-06-16 ENCOUNTER — Encounter (INDEPENDENT_AMBULATORY_CARE_PROVIDER_SITE_OTHER): Payer: Self-pay

## 2019-06-16 ENCOUNTER — Ambulatory Visit (INDEPENDENT_AMBULATORY_CARE_PROVIDER_SITE_OTHER): Payer: Medicaid Other | Admitting: Family Medicine

## 2019-06-16 VITALS — BP 132/60 | HR 76 | Resp 12 | Ht 67.0 in | Wt 196.0 lb

## 2019-06-16 DIAGNOSIS — G894 Chronic pain syndrome: Secondary | ICD-10-CM

## 2019-06-16 DIAGNOSIS — I10 Essential (primary) hypertension: Secondary | ICD-10-CM | POA: Diagnosis not present

## 2019-06-16 DIAGNOSIS — E669 Obesity, unspecified: Secondary | ICD-10-CM

## 2019-06-16 DIAGNOSIS — E785 Hyperlipidemia, unspecified: Secondary | ICD-10-CM | POA: Diagnosis not present

## 2019-06-16 DIAGNOSIS — Z794 Long term (current) use of insulin: Secondary | ICD-10-CM

## 2019-06-16 DIAGNOSIS — IMO0001 Reserved for inherently not codable concepts without codable children: Secondary | ICD-10-CM

## 2019-06-16 DIAGNOSIS — E1165 Type 2 diabetes mellitus with hyperglycemia: Secondary | ICD-10-CM | POA: Diagnosis not present

## 2019-06-16 NOTE — Assessment & Plan Note (Signed)
Will review updated lab, may benefi from repatha, will follow through has h/o markedly elevated LFT Hyperlipidemia:Low fat diet discussed and encouraged.   Lipid Panel  Lab Results  Component Value Date   CHOL 245 (H) 02/21/2019   HDL 40 02/21/2019   LDLCALC 175 (H) 02/21/2019   TRIG 152 (H) 02/21/2019   CHOLHDL 6.1 (H) 02/21/2019    '

## 2019-06-16 NOTE — Assessment & Plan Note (Signed)
uncontroled will follow up on lab drawn today Mr. Mark Walls is reminded of the importance of commitment to daily physical activity for 30 minutes or more, as able and the need to limit carbohydrate intake to 30 to 60 grams per meal to help with blood sugar control.   The need to take medication as prescribed, test blood sugar as directed, and to call between visits if there is a concern that blood sugar is uncontrolled is also discussed.   Mr. Mark Walls is reminded of the importance of daily foot exam, annual eye examination, and good blood sugar, blood pressure and cholesterol control.  Diabetic Labs Latest Ref Rng & Units 02/21/2019 10/30/2018 07/04/2018 10/30/2017 06/12/2017  HbA1c <5.7 % of total Hgb 8.5(H) 7.8(H) 9.2(H) 7.2(H) 6.8(H)  Microalbumin mg/dL - - 0.5 - -  Micro/Creat Ratio <30 mcg/mg creat - - - - -  Chol <200 mg/dL 245(H) 251(H) 219(H) 234(H) 212(H)  HDL > OR = 40 mg/dL 40 43 42 41 42  Calc LDL mg/dL (calc) 175(H) 166(H) 143(H) 155(H) 152(H)  Triglycerides <150 mg/dL 152(H) 258(H) 172(H) 223(H) 90  Creatinine 0.70 - 1.25 mg/dL 0.93 0.88 0.87 0.82 0.94   BP/Weight 06/16/2019 05/29/2019 05/08/2019 03/24/2019 02/12/2019 10/30/2018 5/57/3220  Systolic BP 254 270 623 762 831 517 616  Diastolic BP 60 58 58 65 58 60 90  Wt. (Lbs) 196.04 211 211 208 211 212.08 213  BMI 30.7 33.05 33.05 32.58 33.05 33.22 33.36   Foot/eye exam completion dates Latest Ref Rng & Units 02/25/2018 12/27/2016  Eye Exam No Retinopathy - -  Foot Form Completion - Done Done

## 2019-06-16 NOTE — Assessment & Plan Note (Signed)
Managed by pain clinic an c/o pain rated at 5 to 7 at baseline, despite medication

## 2019-06-16 NOTE — Progress Notes (Signed)
Mark Walls     MRN: [id number]      DOB: Aug 20, 1956   HPI Mark Walls is here for follow up and re-evaluation of chronic medical conditions, medication management and review of any available recent lab and radiology data.  Preventive health is updated, specifically  Cancer screening and Immunization.   Questions or concerns regarding consultations or procedures which the PT has had in the interim are  Addressed.Enrolled in pain clinic where he also has stimulant prescribed The PT denies any adverse reactions to current medications since the last visit.  There are no new concerns.  Chronic back pain rated at a 7 , however , functional , and he is also taking the adderall whic he relies on for energy States blood sugar generally between 120 to 14 in the mornings, tests twice daily .Denies polyuria, polydipsia, blurred vision , or hypoglycemic episodes.  ROS Denies recent fever or chills. Denies sinus pressure, nasal congestion, ear pain or sore throat. Denies chest congestion, productive cough or wheezing. Denies chest pains, palpitations and leg swelling Denies abdominal pain, nausea, vomiting,diarrhea or constipation.   Denies dysuria, frequency, hesitancy or incontinence. . Denies depression, anxiety or insomnia. Denies skin break down or rash.   PE  BP 132/60   Pulse 76   Resp 12   Ht 5\' 7"  (1.702 m)   Wt 196 lb 0.6 oz (88.9 kg)   SpO2 96%   BMI 30.70 kg/m   Patient alert and oriented and in no cardiopulmonary distress.  HEENT: No facial asymmetry, EOMI,   oropharynx pink and moist.  Neck supple no JVD, no mass.  Chest: Clear to auscultation bilaterally.  CVS: S1, S2 no murmurs, no S3.Regular rate.  ABD: Soft non tender.   Ext: No edema  MS: Decreased ROM spine, hips, shoulders and knees..  Skin: Intact, no ulcerations or rash noted.  Psych: Good eye contact, normal affect. Memory intact not anxious or depressed appearing.  CNS: CN 2-12 intact, power,   normal throughout.no focal deficits noted.   Assessment & Plan  Diabetes mellitus, insulin dependent (IDDM), uncontrolled (HCC) uncontroled will follow up on lab drawn today Mark Walls is reminded of the importance of commitment to daily physical activity for 30 minutes or more, as able and the need to limit carbohydrate intake to 30 to 60 grams per meal to help with blood sugar control.   The need to take medication as prescribed, test blood sugar as directed, and to call between visits if there is a concern that blood sugar is uncontrolled is also discussed.   Mark Walls is reminded of the importance of daily foot exam, annual eye examination, and good blood sugar, blood pressure and cholesterol control.  Diabetic Labs Latest Ref Rng & Units 02/21/2019 10/30/2018 07/04/2018 10/30/2017 06/12/2017  HbA1c <5.7 % of total Hgb 8.5(H) 7.8(H) 9.2(H) 7.2(H) 6.8(H)  Microalbumin mg/dL - - 0.5 - -  Micro/Creat Ratio <30 mcg/mg creat - - - - -  Chol <200 mg/dL 213(Y) 865(H) 846(N) 629(B) 212(H)  HDL > OR = 40 mg/dL 40 43 42 41 42  Calc LDL mg/dL (calc) 284(X) 324(M) 010(U) 155(H) 152(H)  Triglycerides <150 mg/dL 725(D) 664(Q) 034(V) 425(Z) 90  Creatinine 0.70 - 1.25 mg/dL 5.63 8.75 6.43 3.29 5.18   BP/Weight 06/16/2019 05/29/2019 05/08/2019 03/24/2019 02/12/2019 10/30/2018 07/04/2018  Systolic BP 132 150 150 160 150 158 160  Diastolic BP 60 58 58 65 58 60 90  Wt. (Lbs) 196.04 211 211 208 211  212.08 213  BMI 30.7 33.05 33.05 32.58 33.05 33.22 33.36   Foot/eye exam completion dates Latest Ref Rng & Units 02/25/2018 12/27/2016  Eye Exam No Retinopathy - -  Foot Form Completion - Done Done        Obesity (BMI 30.0-34.9)  Patient re-educated about  the importance of commitment to a  minimum of 150 minutes of exercise per week as able.  The importance of healthy food choices with portion control discussed, as well as eating regularly and within a 12 hour window most days. The need to choose "clean  , green" food 50 to 75% of the time is discussed, as well as to make water the primary drink and set a goal of 64 ounces water daily.    Weight /BMI 06/16/2019 05/29/2019 05/08/2019  WEIGHT 196 lb 0.6 oz 211 lb 211 lb  HEIGHT 5\' 7"  5\' 7"  5\' 7"   BMI 30.7 kg/m2 33.05 kg/m2 33.05 kg/m2      Hyperlipidemia LDL goal <100 Will review updated lab, may benefi from repatha, will follow through has h/o markedly elevated LFT Hyperlipidemia:Low fat diet discussed and encouraged.   Lipid Panel  Lab Results  Component Value Date   CHOL 245 (H) 02/21/2019   HDL 40 02/21/2019   LDLCALC 175 (H) 02/21/2019   TRIG 152 (H) 02/21/2019   CHOLHDL 6.1 (H) 02/21/2019    '  Chronic pain syndrome Managed by pain clinic an c/o pain rated at 5 to 7 at baseline, despite medication  FATIGUE, CHRONIC Managed with daily adderall with success  Essential hypertension Controlled, no change in medication DASH diet and commitment to daily physical activity for a minimum of 30 minutes discussed and encouraged, as a part of hypertension management. The importance of attaining a healthy weight is also discussed.  BP/Weight 06/16/2019 05/29/2019 05/08/2019 03/24/2019 02/12/2019 10/30/2018 07/04/2018  Systolic BP 132 150 150 160 150 158 160  Diastolic BP 60 58 58 65 58 60 90  Wt. (Lbs) 196.04 211 211 208 211 212.08 213  BMI 30.7 33.05 33.05 32.58 33.05 33.22 33.36

## 2019-06-16 NOTE — Patient Instructions (Addendum)
F/U in 3.5 months, call if you need me before   Blood pressure is excellent, will calll with labs tomorrow  It is important that you exercise regularly at least 30 minutes 5 times a week. If you develop chest pain, have severe difficulty breathing, or feel very tired, stop exercising immediately and seek medical attention   Think about what you will eat, plan ahead. Choose " clean, green, fresh or frozen" over canned, processed or packaged foods which are more sugary, salty and fatty. 70 to 75% of food eaten should be vegetables and fruit. Three meals at set times with snacks allowed between meals, but they must be fruit or vegetables. Aim to eat over a 12 hour period , example 7 am to 7 pm, and STOP after  your last meal of the day. Drink water,generally about 64 ounces per day, no other drink is as healthy. Fruit juice is best enjoyed in a healthy way, by EATING the fruit.   Social distancing. Frequent hand washing with soap and water Keeping your hands off of your face. These 3 practices will help to keep both you and your community healthy during this time. Please practice them faithfully!  Thanks for choosing [facility], we consider it a privelige to serve you.

## 2019-06-16 NOTE — Assessment & Plan Note (Signed)
Managed with daily adderall with success

## 2019-06-16 NOTE — Assessment & Plan Note (Signed)
  Patient re-educated about  the importance of commitment to a  minimum of 150 minutes of exercise per week as able.  The importance of healthy food choices with portion control discussed, as well as eating regularly and within a 12 hour window most days. The need to choose "clean , green" food 50 to 75% of the time is discussed, as well as to make water the primary drink and set a goal of 64 ounces water daily.    Weight /BMI 06/16/2019 05/29/2019 05/08/2019  WEIGHT 196 lb 0.6 oz 211 lb 211 lb  HEIGHT 5\' 7"  5\' 7"  5\' 7"   BMI 30.7 kg/m2 33.05 kg/m2 33.05 kg/m2

## 2019-06-16 NOTE — Assessment & Plan Note (Signed)
Controlled, no change in medication DASH diet and commitment to daily physical activity for a minimum of 30 minutes discussed and encouraged, as a part of hypertension management. The importance of attaining a healthy weight is also discussed.  BP/Weight 06/16/2019 05/29/2019 05/08/2019 03/24/2019 02/12/2019 10/30/2018 5/80/0634  Systolic BP 949 447 395 844 171 278 718  Diastolic BP 60 58 58 65 58 60 90  Wt. (Lbs) 196.04 211 211 208 211 212.08 213  BMI 30.7 33.05 33.05 32.58 33.05 33.22 33.36

## 2019-06-17 ENCOUNTER — Other Ambulatory Visit: Payer: Self-pay | Admitting: Family Medicine

## 2019-06-17 LAB — COMPLETE METABOLIC PANEL WITH GFR
AG Ratio: 1.4 (calc) (ref 1.0–2.5)
ALT: 53 U/L — ABNORMAL HIGH (ref 9–46)
AST: 26 U/L (ref 10–35)
Albumin: 4.2 g/dL (ref 3.6–5.1)
Alkaline phosphatase (APISO): 73 U/L (ref 35–144)
BUN: 15 mg/dL (ref 7–25)
CO2: 28 mmol/L (ref 20–32)
Calcium: 10.6 mg/dL — ABNORMAL HIGH (ref 8.6–10.3)
Chloride: 103 mmol/L (ref 98–110)
Creat: 0.98 mg/dL (ref 0.70–1.25)
GFR, Est African American: 94 mL/min/{1.73_m2} (ref >=60)
GFR, Est Non African American: 81 mL/min/{1.73_m2} (ref >=60)
Globulin: 2.9 g/dL (calc) (ref 1.9–3.7)
Glucose, Bld: 172 mg/dL — ABNORMAL HIGH (ref 65–99)
Potassium: 5 mmol/L (ref 3.5–5.3)
Sodium: 140 mmol/L (ref 135–146)
Total Bilirubin: 0.7 mg/dL (ref 0.2–1.2)
Total Protein: 7.1 g/dL (ref 6.1–8.1)

## 2019-06-17 LAB — LIPID PANEL
Cholesterol: 200 mg/dL — ABNORMAL HIGH (ref <200)
HDL: 42 mg/dL (ref >=40)
LDL Cholesterol (Calc): 129 mg/dL (calc) — ABNORMAL HIGH
Non-HDL Cholesterol (Calc): 158 mg/dL (calc) — ABNORMAL HIGH (ref <130)
Total CHOL/HDL Ratio: 4.8 (calc) (ref <5.0)
Triglycerides: 175 mg/dL — ABNORMAL HIGH (ref <150)

## 2019-06-17 LAB — HEMOGLOBIN A1C
Hgb A1c MFr Bld: 7.9 % of total Hgb — ABNORMAL HIGH (ref <5.7)
Mean Plasma Glucose: 180 (calc)
eAG (mmol/L): 10 (calc)

## 2019-06-17 LAB — CBC
HCT: 47.1 % (ref 38.5–50.0)
Hemoglobin: 15.9 g/dL (ref 13.2–17.1)
MCH: 30.8 pg (ref 27.0–33.0)
MCHC: 33.8 g/dL (ref 32.0–36.0)
MCV: 91.3 fL (ref 80.0–100.0)
MPV: 10.4 fL (ref 7.5–12.5)
Platelets: 216 10*3/uL (ref 140–400)
RBC: 5.16 10*6/uL (ref 4.20–5.80)
RDW: 12.2 % (ref 11.0–15.0)
WBC: 10.3 10*3/uL (ref 3.8–10.8)

## 2019-06-17 LAB — TSH: TSH: 1.86 mIU/L (ref 0.40–4.50)

## 2019-06-17 MED ORDER — INSULIN GLARGINE 100 UNIT/ML SOLOSTAR PEN: 40.0000 [IU] | PEN_INJECTOR | Freq: Every day | SUBCUTANEOUS | 11 refills | Status: DC

## 2019-06-17 NOTE — Progress Notes (Signed)
lantus solostar  

## 2019-06-30 ENCOUNTER — Ambulatory Visit: Payer: Medicaid Other | Admitting: Cardiology

## 2019-07-23 ENCOUNTER — Encounter: Payer: Self-pay | Admitting: Internal Medicine

## 2019-08-14 ENCOUNTER — Encounter: Payer: Self-pay | Admitting: *Deleted

## 2019-08-19 ENCOUNTER — Other Ambulatory Visit: Payer: Self-pay | Admitting: Family Medicine

## 2019-08-27 DIAGNOSIS — IMO0001 Reserved for inherently not codable concepts without codable children: Secondary | ICD-10-CM

## 2019-08-27 DIAGNOSIS — E785 Hyperlipidemia, unspecified: Secondary | ICD-10-CM

## 2019-08-29 ENCOUNTER — Other Ambulatory Visit: Payer: Self-pay | Admitting: Family Medicine

## 2019-09-30 ENCOUNTER — Other Ambulatory Visit: Payer: Self-pay

## 2019-09-30 ENCOUNTER — Ambulatory Visit (INDEPENDENT_AMBULATORY_CARE_PROVIDER_SITE_OTHER): Payer: Medicare Other | Admitting: Family Medicine

## 2019-09-30 ENCOUNTER — Encounter: Payer: Self-pay | Admitting: Family Medicine

## 2019-09-30 VITALS — BP 148/78 | HR 76 | Temp 97.1°F | Resp 15 | Ht 67.0 in | Wt 209.0 lb

## 2019-09-30 DIAGNOSIS — E785 Hyperlipidemia, unspecified: Secondary | ICD-10-CM | POA: Diagnosis not present

## 2019-09-30 DIAGNOSIS — R200 Anesthesia of skin: Secondary | ICD-10-CM

## 2019-09-30 DIAGNOSIS — F322 Major depressive disorder, single episode, severe without psychotic features: Secondary | ICD-10-CM

## 2019-09-30 DIAGNOSIS — I2510 Atherosclerotic heart disease of native coronary artery without angina pectoris: Secondary | ICD-10-CM | POA: Diagnosis not present

## 2019-09-30 DIAGNOSIS — M5090 Cervical disc disorder, unspecified, unspecified cervical region: Secondary | ICD-10-CM

## 2019-09-30 DIAGNOSIS — R202 Paresthesia of skin: Secondary | ICD-10-CM

## 2019-09-30 DIAGNOSIS — E1165 Type 2 diabetes mellitus with hyperglycemia: Secondary | ICD-10-CM

## 2019-09-30 DIAGNOSIS — F900 Attention-deficit hyperactivity disorder, predominantly inattentive type: Secondary | ICD-10-CM

## 2019-09-30 DIAGNOSIS — Z23 Encounter for immunization: Secondary | ICD-10-CM

## 2019-09-30 DIAGNOSIS — I10 Essential (primary) hypertension: Secondary | ICD-10-CM

## 2019-09-30 MED ORDER — AMPHETAMINE-DEXTROAMPHET ER 30 MG PO CP24: ORAL_CAPSULE | ORAL | 0 refills | Status: DC

## 2019-09-30 MED ORDER — INSULIN GLARGINE 100 UNIT/ML SOLOSTAR PEN: 40.0000 [IU] | PEN_INJECTOR | Freq: Every day | SUBCUTANEOUS | 11 refills | Status: DC

## 2019-09-30 MED ORDER — AMLODIPINE BESYLATE 10 MG PO TABS: 10.0000 mg | ORAL_TABLET | Freq: Every day | ORAL | 1 refills | Status: DC

## 2019-09-30 MED ORDER — SPIRONOLACTONE 25 MG PO TABS: 25.0000 mg | ORAL_TABLET | Freq: Every day | ORAL | 1 refills | Status: DC

## 2019-09-30 NOTE — Patient Instructions (Addendum)
F/U with MD in 7 weeks , call if you need me before  Flu and pneumonia 23 vaccines today  I will prescribe adderall two times daily, take only as directed  Labs no later than next week please, fasting lipid, cmp and EGFR, hBA1C and microalb  Left arm numbness is from your neck , please get X ray of C spine ( neck) and I will refer you for nerve testing to Dr Merlene Laughter  New additional medication for your blood pessure which is too high is spironolactone 1 daily  PLEASE bring all medications you take to your next visit  Thanks for choosing [facility], we consider it a privelige to serve you.

## 2019-09-30 NOTE — Progress Notes (Signed)
Mark Walls     MRN: [id number]      DOB: 13-Jan-1956   HPI Mark Walls is here for follow up and re-evaluation of chronic medical conditions, medication management and review of any available recent lab and radiology data.  Preventive health is updated, specifically  Cancer screening and Immunization.   Requests that I be responsible for adderrall as previous Prescriber is no longer able, was maintained on three times daily dose, I explain that the max dose that I am able to prescribe is twice daily, and he agrees that he will manage on the lower dose. States repeatedly that he needs the medication to be able to function Denies polyuria, polydipsia, blurred vision , or hypoglycemic episodes.staes when he tests , his blood sugar is controlled    ROS Denies recent fever or chills. Denies sinus pressure, nasal congestion, ear pain or sore throat. Denies chest congestion, productive cough or wheezing. Denies chest pains, palpitations and leg swelling Denies abdominal pain, nausea, vomiting,diarrhea or constipation.   Denies dysuria, frequency, hesitancy or incontinence. Chronic  joint pain, swelling and limitation in mobility. C/o new upper extremity numbness Denies depression, anxiety or insomnia. Denies skin break down or rash.   PE  BP (!) 148/78   Pulse 76   Temp (!) 97.1 F (36.2 C) (Temporal)   Resp 15   Ht 5\' 7"  (1.702 m)   Wt 209 lb (94.8 kg)   SpO2 96%   BMI 32.73 kg/m   Patient alert and oriented and in no cardiopulmonary distress.  HEENT: No facial asymmetry, EOMI,     Neck supple .  Chest: Clear to auscultation bilaterally.  CVS: S1, S2 no murmurs, no S3.Regular rate.  ABD: Soft non tender.   Ext: No edema  MS: decreased  ROM spine, shoulders, hips and knees.  Skin: Intact, no ulcerations or rash noted.  Psych: Good eye contact, normal affect. Memory intact not anxious or depressed appearing.  CNS: CN 2-12 intact, power,  normal throughout.no focal  deficits noted.   Assessment & Plan  Cervical neck pain with evidence of disc disease Reports increased and new numbness and tingling in left upper extremity, rept x ray of c soine and refer for nerve testing  Essential hypertension Uncontrolled, spironolactone added DASH diet and commitment to daily physical activity for a minimum of 30 minutes discussed and encouraged, as a part of hypertension management. The importance of attaining a healthy weight is also discussed.  BP/Weight 09/30/2019 06/16/2019 05/29/2019 05/08/2019 03/24/2019 02/12/2019 10/30/2018  Systolic BP 148 132 150 150 160 150 158  Diastolic BP 78 60 58 58 65 58 60  Wt. (Lbs) 209 196.04 211 211 208 211 212.08  BMI 32.73 30.7 33.05 33.05 32.58 33.05 33.22       Diabetes mellitus, insulin dependent (IDDM), uncontrolled (HCC) Updated lab needed at/ before next visit. Mark Walls is reminded of the importance of commitment to daily physical activity for 30 minutes or more, as able and the need to limit carbohydrate intake to 30 to 60 grams per meal to help with blood sugar control.   The need to take medication as prescribed, test blood sugar as directed, and to call between visits if there is a concern that blood sugar is uncontrolled is also discussed.   Mark Walls is reminded of the importance of daily foot exam, annual eye examination, and good blood sugar, blood pressure and cholesterol control.  Diabetic Labs Latest Ref Rng & Units 06/16/2019 02/21/2019 10/30/2018  07/04/2018 10/30/2017  HbA1c <5.7 % of total Hgb 7.9(H) 8.5(H) 7.8(H) 9.2(H) 7.2(H)  Microalbumin mg/dL - - - 0.5 -  Micro/Creat Ratio <30 mcg/mg creat - - - - -  Chol <200 mg/dL 161(W) 960(A) 540(J) 811(B) 234(H)  HDL > OR = 40 mg/dL 42 40 43 42 41  Calc LDL mg/dL (calc) 147(W) 295(A) 213(Y) 143(H) 155(H)  Triglycerides <150 mg/dL 865(H) 846(N) 629(B) 284(X) 223(H)  Creatinine 0.70 - 1.25 mg/dL 3.24 4.01 0.27 2.53 6.64   BP/Weight 09/30/2019 06/16/2019  05/29/2019 05/08/2019 03/24/2019 02/12/2019 10/30/2018  Systolic BP 148 132 150 150 160 150 158  Diastolic BP 78 60 58 58 65 58 60  Wt. (Lbs) 209 196.04 211 211 208 211 212.08  BMI 32.73 30.7 33.05 33.05 32.58 33.05 33.22   Foot/eye exam completion dates Latest Ref Rng & Units 02/25/2018 12/27/2016  Eye Exam No Retinopathy - -  Foot Form Completion - Done Done        ADHD Has been maintained on high dose adder all, now needs script from PCP, will rx but at  A lower dose  Depression, major, single episode, severe (HCC) Needs psych eval and management due to severity of symptoms, will contact pt and advise him to reach out to Psych. Denies current suicidal or homicidal ideation and has no plan/ intent

## 2019-09-30 NOTE — Progress Notes (Signed)
htn

## 2019-10-01 ENCOUNTER — Telehealth: Payer: Self-pay | Admitting: Family Medicine

## 2019-10-01 NOTE — Telephone Encounter (Signed)
Pt called said Pharmacy stated he needed a Auth for his medication

## 2019-10-02 ENCOUNTER — Telehealth: Payer: Self-pay | Admitting: Family Medicine

## 2019-10-02 NOTE — Telephone Encounter (Signed)
2 pills one time a day for 30 days--It was written incorrectly  For Adderall

## 2019-10-03 ENCOUNTER — Other Ambulatory Visit: Payer: Self-pay | Admitting: Family Medicine

## 2019-10-03 NOTE — Telephone Encounter (Signed)
The prescription is written correctly. Needs to be PA'ed. Insurance only allows for 1 tablet a day.

## 2019-10-03 NOTE — Telephone Encounter (Signed)
PA completed. No answer given. Patient notified.

## 2019-10-05 ENCOUNTER — Encounter: Payer: Self-pay | Admitting: Family Medicine

## 2019-10-05 DIAGNOSIS — F322 Major depressive disorder, single episode, severe without psychotic features: Secondary | ICD-10-CM | POA: Insufficient documentation

## 2019-10-05 DIAGNOSIS — F909 Attention-deficit hyperactivity disorder, unspecified type: Secondary | ICD-10-CM | POA: Insufficient documentation

## 2019-10-05 NOTE — Assessment & Plan Note (Signed)
Needs psych eval and management due to severity of symptoms, will contact pt and advise him to reach out to Psych. Denies current suicidal or homicidal ideation and has no plan/ intent

## 2019-10-05 NOTE — Assessment & Plan Note (Signed)
Reports increased and new numbness and tingling in left upper extremity, rept x ray of c soine and refer for nerve testing

## 2019-10-05 NOTE — Assessment & Plan Note (Signed)
Has been maintained on high dose adder all, now needs script from PCP, will rx but at  A lower dose

## 2019-10-05 NOTE — Assessment & Plan Note (Signed)
Uncontrolled, spironolactone added DASH diet and commitment to daily physical activity for a minimum of 30 minutes discussed and encouraged, as a part of hypertension management. The importance of attaining a healthy weight is also discussed.  BP/Weight 09/30/2019 06/16/2019 05/29/2019 05/08/2019 03/24/2019 02/12/2019 0000000  Systolic BP 123456 Q000111Q Q000111Q Q000111Q 0000000 Q000111Q 0000000  Diastolic BP 78 60 58 58 65 58 60  Wt. (Lbs) 209 196.04 211 211 208 211 212.08  BMI 32.73 30.7 33.05 33.05 32.58 33.05 33.22

## 2019-10-05 NOTE — Assessment & Plan Note (Signed)
Updated lab needed at/ before next visit. Mark Walls is reminded of the importance of commitment to daily physical activity for 30 minutes or more, as able and the need to limit carbohydrate intake to 30 to 60 grams per meal to help with blood sugar control.   The need to take medication as prescribed, test blood sugar as directed, and to call between visits if there is a concern that blood sugar is uncontrolled is also discussed.   Mark Walls is reminded of the importance of daily foot exam, annual eye examination, and good blood sugar, blood pressure and cholesterol control.  Diabetic Labs Latest Ref Rng & Units 06/16/2019 02/21/2019 10/30/2018 07/04/2018 10/30/2017  HbA1c <5.7 % of total Hgb 7.9(H) 8.5(H) 7.8(H) 9.2(H) 7.2(H)  Microalbumin mg/dL - - - 0.5 -  Micro/Creat Ratio <30 mcg/mg creat - - - - -  Chol <200 mg/dL 200(H) 245(H) 251(H) 219(H) 234(H)  HDL > OR = 40 mg/dL 42 40 43 42 41  Calc LDL mg/dL (calc) 129(H) 175(H) 166(H) 143(H) 155(H)  Triglycerides <150 mg/dL 175(H) 152(H) 258(H) 172(H) 223(H)  Creatinine 0.70 - 1.25 mg/dL 0.98 0.93 0.88 0.87 0.82   BP/Weight 09/30/2019 06/16/2019 05/29/2019 05/08/2019 03/24/2019 02/12/2019 0000000  Systolic BP 123456 Q000111Q Q000111Q Q000111Q 0000000 Q000111Q 0000000  Diastolic BP 78 60 58 58 65 58 60  Wt. (Lbs) 209 196.04 211 211 208 211 212.08  BMI 32.73 30.7 33.05 33.05 32.58 33.05 33.22   Foot/eye exam completion dates Latest Ref Rng & Units 02/25/2018 12/27/2016  Eye Exam No Retinopathy - -  Foot Form Completion - Done Done

## 2019-10-06 ENCOUNTER — Telehealth: Payer: Self-pay | Admitting: *Deleted

## 2019-10-06 NOTE — Telephone Encounter (Signed)
Larene Beach with [facility] called about the prior authorization for pts adderall. Wants to know if this is going to be done as they cant make the pt understand that they have to wait on this. She would like a call back at [phone]

## 2019-10-06 NOTE — Telephone Encounter (Signed)
Spoke with Larene Beach and let her know the PA has been started but we haven't gotten an answer.

## 2019-10-06 NOTE — Telephone Encounter (Signed)
Pt came by office, states his adderall and his lantus were denied. Needs PA on both. Told him I would work on them today

## 2019-10-07 ENCOUNTER — Telehealth: Payer: Self-pay | Admitting: *Deleted

## 2019-10-07 NOTE — Telephone Encounter (Signed)
See previous messages regarding the PA's

## 2019-10-07 NOTE — Telephone Encounter (Signed)
Still waiting on updated lab to determine insulin dose , at that time I will change to levemir or alternative covered by his ins Also when you speak with him, pls let him know I recommend fluoxetinE 20 mg daily for depression, let me know if he agrees I will prescribe. Will look into the adderaLL AS FAR AS OPTION, HE LIKELY WILL JUST NEED TO REDUCE TO ONCE DAILY, WILL GET BACK RE ADDERALL

## 2019-10-07 NOTE — Telephone Encounter (Signed)
PA for adderall for more than once per day dosing was DENIED.  They will only pay for once daily dosing. Patient aware insurance requires changing the mg and dosing to once per day for coverage. Please advise  Also lantus isn't covered. Do you want to change or refer him to health dept?

## 2019-10-07 NOTE — Telephone Encounter (Signed)
Fax received meds were denied and they will only fill one time a day. Insulin was also denied.

## 2019-10-08 ENCOUNTER — Other Ambulatory Visit: Payer: Self-pay | Admitting: Family Medicine

## 2019-10-08 ENCOUNTER — Telehealth: Payer: Self-pay | Admitting: *Deleted

## 2019-10-08 MED ORDER — AMPHETAMINE-DEXTROAMPHET ER 30 MG PO CP24: 30.0000 mg | ORAL_CAPSULE | ORAL | 0 refills | Status: DC

## 2019-10-08 MED ORDER — FLUOXETINE HCL 20 MG PO TABS: 20.0000 mg | ORAL_TABLET | Freq: Every day | ORAL | 3 refills | Status: DC

## 2019-10-08 NOTE — Telephone Encounter (Signed)
I spoke directly to Mark Walls, he is aware that script is only authorized for once daily and he will fill today Will also start fluoxetine 20 mg daily for depression

## 2019-10-08 NOTE — Telephone Encounter (Signed)
Pt wife called stating that dr simpson needs to send in a paper saying that pt takes 2 pills a day or so he can get 60 pills a month because 1 a day will not do anything for him. Also let him know that he needed to have his blood work done as we was waiting on the results.

## 2019-10-08 NOTE — Telephone Encounter (Signed)
As the patient has already been told, a prior authorization for the 2 pills per day was DENIED and they said no more than once per day. Patient has showed up yesterday and today inquiring about when the med will be sent in

## 2019-10-09 ENCOUNTER — Telehealth: Payer: Self-pay | Admitting: *Deleted

## 2019-10-09 ENCOUNTER — Ambulatory Visit: Payer: Medicaid Other | Admitting: Family Medicine

## 2019-10-09 DIAGNOSIS — E782 Mixed hyperlipidemia: Secondary | ICD-10-CM

## 2019-10-09 LAB — HEMOGLOBIN A1C
Hgb A1c MFr Bld: 8.9 % of total Hgb — ABNORMAL HIGH (ref <5.7)
Mean Plasma Glucose: 209 (calc)
eAG (mmol/L): 11.6 (calc)

## 2019-10-09 LAB — MICROALBUMIN / CREATININE URINE RATIO
Creatinine, Urine: 215 mg/dL (ref 20–320)
Microalb Creat Ratio: 13 mcg/mg creat (ref <30)
Microalb, Ur: 2.8 mg/dL

## 2019-10-09 LAB — COMPLETE METABOLIC PANEL WITH GFR
AG Ratio: 1.4 (calc) (ref 1.0–2.5)
ALT: 78 U/L — ABNORMAL HIGH (ref 9–46)
AST: 36 U/L — ABNORMAL HIGH (ref 10–35)
Albumin: 4.3 g/dL (ref 3.6–5.1)
Alkaline phosphatase (APISO): 76 U/L (ref 35–144)
BUN: 15 mg/dL (ref 7–25)
CO2: 28 mmol/L (ref 20–32)
Calcium: 10.5 mg/dL — ABNORMAL HIGH (ref 8.6–10.3)
Chloride: 102 mmol/L (ref 98–110)
Creat: 0.91 mg/dL (ref 0.70–1.25)
GFR, Est African American: 102 mL/min/{1.73_m2} (ref >=60)
GFR, Est Non African American: 88 mL/min/{1.73_m2} (ref >=60)
Globulin: 3.1 g/dL (calc) (ref 1.9–3.7)
Glucose, Bld: 249 mg/dL — ABNORMAL HIGH (ref 65–99)
Potassium: 5.1 mmol/L (ref 3.5–5.3)
Sodium: 137 mmol/L (ref 135–146)
Total Bilirubin: 0.7 mg/dL (ref 0.2–1.2)
Total Protein: 7.4 g/dL (ref 6.1–8.1)

## 2019-10-09 LAB — LIPID PANEL
Cholesterol: 250 mg/dL — ABNORMAL HIGH (ref <200)
HDL: 45 mg/dL (ref >=40)
LDL Cholesterol (Calc): 176 mg/dL (calc) — ABNORMAL HIGH
Non-HDL Cholesterol (Calc): 205 mg/dL (calc) — ABNORMAL HIGH (ref <130)
Total CHOL/HDL Ratio: 5.6 (calc) — ABNORMAL HIGH (ref <5.0)
Triglycerides: 149 mg/dL (ref <150)

## 2019-10-09 NOTE — Telephone Encounter (Signed)
-----   Message from Arnoldo Lenis, MD sent at 10/09/2019  9:36 AM EDT ----- Regarding: FW: help with lipid treatment please Thanks for update, Shenicka Sunderlin can we refer this patient to lipid clinic please   Zandra Abts MD ----- Message ----- From: Fayrene Helper, MD Sent: 10/09/2019   7:02 AM EDT To: Arnoldo Lenis, MD Subject: help with lipid treatment please               Good am!  Please see recent lipid profile for our mutual pt. As you are aware he is intolerant of oral statins, pls assist with management.  I have also asked him to reach out to your office , he mentioned that you had offered alternate management in the past. Thanks

## 2019-10-09 NOTE — Telephone Encounter (Signed)
Orders placed and forwarded to schedulers

## 2019-10-14 ENCOUNTER — Other Ambulatory Visit: Payer: Self-pay | Admitting: Family Medicine

## 2019-10-14 MED ORDER — INSULIN DETEMIR 100 UNIT/ML ~~LOC~~ SOLN: 50.0000 [IU] | Freq: Every day | SUBCUTANEOUS | 2 refills | Status: DC

## 2019-10-16 ENCOUNTER — Other Ambulatory Visit: Payer: Self-pay

## 2019-10-16 MED ORDER — GLOBAL EASE INJECT PEN NEEDLES 31G X 5 MM MISC: 11 refills | Status: DC

## 2019-10-16 NOTE — Telephone Encounter (Signed)
See updated message in chart regarding insulin

## 2019-10-29 ENCOUNTER — Telehealth: Payer: Self-pay | Admitting: Licensed Clinical Social Worker

## 2019-10-29 NOTE — Telephone Encounter (Signed)
[facility] left message encouraging contact

## 2019-10-30 ENCOUNTER — Other Ambulatory Visit: Payer: Self-pay | Admitting: Family Medicine

## 2019-10-30 ENCOUNTER — Telehealth: Payer: Self-pay

## 2019-10-30 MED ORDER — AMPHETAMINE-DEXTROAMPHET ER 30 MG PO CP24: 30.0000 mg | ORAL_CAPSULE | ORAL | 0 refills | Status: DC

## 2019-10-30 NOTE — Telephone Encounter (Signed)
sent 

## 2019-10-30 NOTE — Telephone Encounter (Signed)
Pt LVM asking for a call back  From nurse

## 2019-10-30 NOTE — Telephone Encounter (Signed)
Patient needs Adderall called in

## 2019-11-19 ENCOUNTER — Other Ambulatory Visit: Payer: Self-pay

## 2019-11-19 ENCOUNTER — Encounter: Payer: Self-pay | Admitting: Family Medicine

## 2019-11-19 ENCOUNTER — Ambulatory Visit (INDEPENDENT_AMBULATORY_CARE_PROVIDER_SITE_OTHER): Payer: Medicare Other | Admitting: Family Medicine

## 2019-11-19 VITALS — BP 148/78 | Ht 67.0 in | Wt 211.0 lb

## 2019-11-19 DIAGNOSIS — Z794 Long term (current) use of insulin: Secondary | ICD-10-CM

## 2019-11-19 DIAGNOSIS — M5410 Radiculopathy, site unspecified: Secondary | ICD-10-CM

## 2019-11-19 DIAGNOSIS — E1165 Type 2 diabetes mellitus with hyperglycemia: Secondary | ICD-10-CM

## 2019-11-19 DIAGNOSIS — E785 Hyperlipidemia, unspecified: Secondary | ICD-10-CM

## 2019-11-19 DIAGNOSIS — Z125 Encounter for screening for malignant neoplasm of prostate: Secondary | ICD-10-CM

## 2019-11-19 DIAGNOSIS — I10 Essential (primary) hypertension: Secondary | ICD-10-CM

## 2019-11-19 DIAGNOSIS — E669 Obesity, unspecified: Secondary | ICD-10-CM

## 2019-11-19 DIAGNOSIS — I2510 Atherosclerotic heart disease of native coronary artery without angina pectoris: Secondary | ICD-10-CM

## 2019-11-19 MED ORDER — INSULIN DETEMIR 100 UNIT/ML FLEXPEN: SUBCUTANEOUS | 3 refills | Status: DC

## 2019-11-19 MED ORDER — SITAGLIPTIN PHOS-METFORMIN HCL 50-1000 MG PO TABS: 1.0000 | ORAL_TABLET | Freq: Two times a day (BID) | ORAL | 5 refills | Status: DC

## 2019-11-19 NOTE — Progress Notes (Signed)
Virtual Visit via Telephone Note  I connected with Mark Walls on 11/19/19 at  2:20 PM EST by telephone and verified that I am speaking with the correct person using two identifiers.  Location: Patient: on the road Provider: office   I discussed the limitations, risks, security and privacy concerns of performing an evaluation and management service by telephone and the availability of in person appointments. I also discussed with the patient that there may be a patient responsible charge related to this service. The patient expressed understanding and agreed to proceed.   History of Present Illness: States fBG average 180 , denies excess thirst, polyuria or blurred vision  C/o chronic back pian worsening Denies recent fever or chills. Denies sinus pressure, nasal congestion, ear pain or sore throat. Denies chest congestion, productive cough or wheezing. Denies chest pains, palpitations and leg swelling Denies abdominal pain, nausea, vomiting,diarrhea or constipation.   Denies dysuria, frequency, hesitancy or incontinence.  Denies headaches, seizures, numbness, or tingling. C/o  depression, anxiety and  Insomnia.Not suicidal or homicidal Denies skin break down or rash.       Observations/Objective: BP (!) 148/78   Ht 5\' 7"  (1.702 m)   Wt 211 lb (95.7 kg)   BMI 33.05 kg/m  Good communication with no confusion and intact memory. Alert and oriented x 3 No signs of respiratory distress during speech    Assessment and Plan: Type 2 diabetes mellitus with hyperglycemia (HCC) Uncontrolled , increase levemir to 0 units, rtest at least twice daily andfollow diet, call between visits if needed [name] is reminded of the importance of commitment to daily physical activity for 30 minutes or more, as able and the need to limit carbohydrate intake to 30 to 60 grams per meal to help with blood sugar control.   The need to take medication as prescribed, test blood sugar as  directed, and to call between visits if there is a concern that blood sugar is uncontrolled is also discussed.   [name] is reminded of the importance of daily foot exam, annual eye examination, and good blood sugar, blood pressure and cholesterol control.  Diabetic Labs Latest Ref Rng & Units 10/08/2019 06/16/2019 02/21/2019 10/30/2018 07/04/2018  HbA1c <5.7 % of total Hgb 8.9(H) 7.9(H) 8.5(H) 7.8(H) 9.2(H)  Microalbumin mg/dL 2.8 - - - 0.5  Micro/Creat Ratio <30 mcg/mg creat 13 - - - -  Chol <200 mg/dL 250(H) 200(H) 245(H) 251(H) 219(H)  HDL > OR = 40 mg/dL 45 42 40 43 42  Calc LDL mg/dL (calc) 176(H) 129(H) 175(H) 166(H) 143(H)  Triglycerides <150 mg/dL 149 175(H) 152(H) 258(H) 172(H)  Creatinine 0.70 - 1.25 mg/dL 0.91 0.98 0.93 0.88 0.87   BP/Weight 11/19/2019 09/30/2019 06/16/2019 05/29/2019 05/08/2019 03/24/2019 99991111  Systolic BP 123456 123456 Q000111Q Q000111Q Q000111Q 0000000 Q000111Q  Diastolic BP 78 78 60 58 58 65 58  Wt. (Lbs) 211 209 196.04 211 211 208 211  BMI 33.05 32.73 30.7 33.05 33.05 32.58 33.05   Foot/eye exam completion dates Latest Ref Rng & Units 02/25/2018 12/27/2016  Eye Exam No Retinopathy - -  Foot Form Completion - Done Done        Essential hypertension DASH diet and commitment to daily physical activity for a minimum of 30 minutes discussed and encouraged, as a part of hypertension management. The importance of attaining a healthy weight is also discussed.  BP/Weight 11/19/2019 09/30/2019 06/16/2019 05/29/2019 05/08/2019 03/24/2019 [id number]  Systolic BP 123456 123456 Q000111Q Q000111Q Q000111Q 0000000 Q000111Q  Diastolic  BP 78 78 60 58 58 65 58  Wt. (Lbs) 211 209 196.04 211 211 208 211  BMI 33.05 32.73 30.7 33.05 33.05 32.58 33.05   Not at goal, in house re eval needed with meds   Obesity (BMI 30.0-34.9)  Patient re-educated about  the importance of commitment to a  minimum of 150 minutes of exercise per week as able.  The importance of healthy food choices with portion control discussed, as well as eating  regularly and within a 12 hour window most days. The need to choose "clean , green" food 50 to 75% of the time is discussed, as well as to make water the primary drink and set a goal of 64 ounces water daily.    Weight /BMI 11/19/2019 09/30/2019 06/16/2019  WEIGHT 211 lb 209 lb 196 lb 0.6 oz  HEIGHT 5\' 7"  5\' 7"  5\' 7"   BMI 33.05 kg/m2 32.73 kg/m2 30.7 kg/m2      Hyperlipidemia LDL goal <100 mrked hyprlipidemia, has appt at lipid clinic which he needs to keep, he is statin intolerant Hyperlipidemia:Low fat diet discussed and encouraged.   Lipid Panel  Lab Results  Component Value Date   CHOL 250 (H) 10/08/2019   HDL 45 10/08/2019   LDLCALC 176 (H) 10/08/2019   TRIG 149 10/08/2019   CHOLHDL 5.6 (H) 10/08/2019       Back pain with left-sided radiculopathy Reports uncontrolled pain ,[facility], he is encouraged to  Keep follow up there     Follow Up Instructions:    I discussed the assessment and treatment plan with the patient. The patient was provided an opportunity to ask questions and all were answered. The patient agreed with the plan and demonstrated an understanding of the instructions.   The patient was advised to call back or seek an in-person evaluation if the symptoms worsen or if the condition fails to improve as anticipated.  I provided 25 minutes of non-face-to-face time during this encounter.   Tula Nakayama, MD

## 2019-11-19 NOTE — Patient Instructions (Addendum)
F/U in office re eval bP last week in January, call if you need me before  Increase levimir to 70 units daily, take 50 units in the morning and 20 units in the evening  Please keep appt with lipid clinic as discussed  HBA1C, cmp and EGFR non fasting  Also PSA on January 21 or 22, NEED lab before visit  Back pain to be managed by pain clinic  Thanks for choosing [facility], we consider it a privelige to serve you.

## 2019-11-28 ENCOUNTER — Encounter: Payer: Self-pay | Admitting: Family Medicine

## 2019-11-28 ENCOUNTER — Telehealth: Payer: Self-pay | Admitting: *Deleted

## 2019-11-28 NOTE — Assessment & Plan Note (Signed)
Uncontrolled , increase levemir to 0 units, rtest at least twice daily andfollow diet, call between visits if needed Mark Walls is reminded of the importance of commitment to daily physical activity for 30 minutes or more, as able and the need to limit carbohydrate intake to 30 to 60 grams per meal to help with blood sugar control.   The need to take medication as prescribed, test blood sugar as directed, and to call between visits if there is a concern that blood sugar is uncontrolled is also discussed.   Mark Walls is reminded of the importance of daily foot exam, annual eye examination, and good blood sugar, blood pressure and cholesterol control.  Diabetic Labs Latest Ref Rng & Units 10/08/2019 06/16/2019 02/21/2019 10/30/2018 07/04/2018  HbA1c <5.7 % of total Hgb 8.9(H) 7.9(H) 8.5(H) 7.8(H) 9.2(H)  Microalbumin mg/dL 2.8 - - - 0.5  Micro/Creat Ratio <30 mcg/mg creat 13 - - - -  Chol <200 mg/dL 250(H) 200(H) 245(H) 251(H) 219(H)  HDL > OR = 40 mg/dL 45 42 40 43 42  Calc LDL mg/dL (calc) 176(H) 129(H) 175(H) 166(H) 143(H)  Triglycerides <150 mg/dL 149 175(H) 152(H) 258(H) 172(H)  Creatinine 0.70 - 1.25 mg/dL 0.91 0.98 0.93 0.88 0.87   BP/Weight 11/19/2019 09/30/2019 06/16/2019 05/29/2019 05/08/2019 03/24/2019 [id number]  Systolic BP 123456 123456 Q000111Q Q000111Q Q000111Q 0000000 Q000111Q  Diastolic BP 78 78 60 58 58 65 58  Wt. (Lbs) 211 209 196.04 211 211 208 211  BMI 33.05 32.73 30.7 33.05 33.05 32.58 33.05   Foot/eye exam completion dates Latest Ref Rng & Units 02/25/2018 12/27/2016  Eye Exam No Retinopathy - -  Foot Form Completion - Done Done

## 2019-11-28 NOTE — Assessment & Plan Note (Signed)
mrked hyprlipidemia, has appt at lipid clinic which he needs to keep, he is statin intolerant Hyperlipidemia:Low fat diet discussed and encouraged.   Lipid Panel  Lab Results  Component Value Date   CHOL 250 (H) 10/08/2019   HDL 45 10/08/2019   LDLCALC 176 (H) 10/08/2019   TRIG 149 10/08/2019   CHOLHDL 5.6 (H) 10/08/2019

## 2019-11-28 NOTE — Telephone Encounter (Signed)
Pt called wanting to make sure all his meds especially the adderall got called in before christmas as he didn't want to run out of the medication

## 2019-11-28 NOTE — Assessment & Plan Note (Signed)
Reports uncontrolled pain ,[facility], he is encouraged to  Keep follow up there

## 2019-11-28 NOTE — Assessment & Plan Note (Signed)
  Patient re-educated about  the importance of commitment to a  minimum of 150 minutes of exercise per week as able.  The importance of healthy food choices with portion control discussed, as well as eating regularly and within a 12 hour window most days. The need to choose "clean , green" food 50 to 75% of the time is discussed, as well as to make water the primary drink and set a goal of 64 ounces water daily.    Weight /BMI 11/19/2019 09/30/2019 06/16/2019  WEIGHT 211 lb 209 lb 196 lb 0.6 oz  HEIGHT 5\' 7"  5\' 7"  5\' 7"   BMI 33.05 kg/m2 32.73 kg/m2 30.7 kg/m2

## 2019-11-28 NOTE — Assessment & Plan Note (Signed)
DASH diet and commitment to daily physical activity for a minimum of 30 minutes discussed and encouraged, as a part of hypertension management. The importance of attaining a healthy weight is also discussed.  BP/Weight 11/19/2019 09/30/2019 06/16/2019 05/29/2019 05/08/2019 03/24/2019 [id number]  Systolic BP 123456 123456 Q000111Q Q000111Q Q000111Q 0000000 Q000111Q  Diastolic BP 78 78 60 58 58 65 58  Wt. (Lbs) 211 209 196.04 211 211 208 211  BMI 33.05 32.73 30.7 33.05 33.05 32.58 33.05   Not at goal, in house re eval needed with meds

## 2019-12-01 ENCOUNTER — Other Ambulatory Visit: Payer: Self-pay

## 2019-12-01 MED ORDER — FLUOXETINE HCL 20 MG PO CAPS: 20.0000 mg | ORAL_CAPSULE | Freq: Every day | ORAL | 1 refills | Status: DC

## 2019-12-01 MED ORDER — FLUOXETINE HCL 20 MG PO TABS: 20.0000 mg | ORAL_TABLET | Freq: Every day | ORAL | 3 refills | Status: DC

## 2019-12-01 MED ORDER — EZETIMIBE 10 MG PO TABS: 10.0000 mg | ORAL_TABLET | Freq: Every day | ORAL | 1 refills | Status: DC

## 2019-12-01 MED ORDER — INSULIN DETEMIR 100 UNIT/ML ~~LOC~~ SOLN: 50.0000 [IU] | Freq: Every day | SUBCUTANEOUS | 2 refills | Status: DC

## 2019-12-01 MED ORDER — METOPROLOL TARTRATE 25 MG PO TABS: 12.5000 mg | ORAL_TABLET | Freq: Two times a day (BID) | ORAL | 1 refills | Status: DC

## 2019-12-01 MED ORDER — GLIPIZIDE ER 10 MG PO TB24: ORAL_TABLET | ORAL | 1 refills | Status: DC

## 2019-12-04 ENCOUNTER — Telehealth: Payer: Medicare Other | Admitting: Internal Medicine

## 2020-01-14 ENCOUNTER — Ambulatory Visit: Payer: Medicare Other | Admitting: Family Medicine

## 2020-01-21 ENCOUNTER — Other Ambulatory Visit (HOSPITAL_COMMUNITY)
Admission: RE | Admit: 2020-01-21 | Discharge: 2020-01-21 | Disposition: A | Discharge status: Home / Self Care | Payer: Medicare Other | Source: Other Acute Inpatient Hospital | Attending: Family Medicine | Admitting: Family Medicine

## 2020-01-21 ENCOUNTER — Other Ambulatory Visit: Payer: Self-pay

## 2020-01-21 ENCOUNTER — Telehealth: Payer: Self-pay

## 2020-01-21 ENCOUNTER — Encounter: Payer: Self-pay | Admitting: Family Medicine

## 2020-01-21 ENCOUNTER — Ambulatory Visit (INDEPENDENT_AMBULATORY_CARE_PROVIDER_SITE_OTHER): Payer: Medicare Other | Admitting: Family Medicine

## 2020-01-21 VITALS — BP 150/78 | HR 61 | Temp 99.0°F | Ht 67.0 in | Wt 209.4 lb

## 2020-01-21 DIAGNOSIS — E785 Hyperlipidemia, unspecified: Secondary | ICD-10-CM

## 2020-01-21 DIAGNOSIS — Z794 Long term (current) use of insulin: Secondary | ICD-10-CM

## 2020-01-21 DIAGNOSIS — E1165 Type 2 diabetes mellitus with hyperglycemia: Secondary | ICD-10-CM

## 2020-01-21 DIAGNOSIS — R519 Headache, unspecified: Secondary | ICD-10-CM

## 2020-01-21 DIAGNOSIS — Z125 Encounter for screening for malignant neoplasm of prostate: Secondary | ICD-10-CM

## 2020-01-21 DIAGNOSIS — R2681 Unsteadiness on feet: Secondary | ICD-10-CM | POA: Diagnosis not present

## 2020-01-21 DIAGNOSIS — Z0289 Encounter for other administrative examinations: Secondary | ICD-10-CM | POA: Diagnosis present

## 2020-01-21 DIAGNOSIS — R42 Dizziness and giddiness: Secondary | ICD-10-CM | POA: Insufficient documentation

## 2020-01-21 DIAGNOSIS — G8929 Other chronic pain: Secondary | ICD-10-CM

## 2020-01-21 DIAGNOSIS — F322 Major depressive disorder, single episode, severe without psychotic features: Secondary | ICD-10-CM

## 2020-01-21 DIAGNOSIS — I10 Essential (primary) hypertension: Secondary | ICD-10-CM

## 2020-01-21 MED ORDER — CHLORTHALIDONE 25 MG PO TABS: ORAL_TABLET | ORAL | 3 refills | Status: DC

## 2020-01-21 MED ORDER — INSULIN DETEMIR 100 UNIT/ML FLEXPEN: SUBCUTANEOUS | 5 refills | Status: DC

## 2020-01-21 NOTE — Assessment & Plan Note (Addendum)
New headaches with episodes of light headedness and abnormal sensation in head as though head is shifting from side to side , followed by nausea and unsteady gait, recurrent and increasing in frequency needs carotid doppler, brain scan and Neurology referral

## 2020-01-21 NOTE — Assessment & Plan Note (Signed)
Mark Walls is reminded of the importance of commitment to daily physical activity for 30 minutes or more, as able and the need to limit carbohydrate intake to 30 to 60 grams per meal to help with blood sugar control.   The need to take medication as prescribed, test blood sugar as directed, and to call between visits if there is a concern that blood sugar is uncontrolled is also discussed.   Mark Walls is reminded of the importance of daily foot exam, annual eye examination, and good blood sugar, blood pressure and cholesterol control. Uncontrolled Updated lab needed at/ before next visit.  Diabetic Labs Latest Ref Rng & Units 10/08/2019 06/16/2019 02/21/2019 10/30/2018 07/04/2018  HbA1c <5.7 % of total Hgb 8.9(H) 7.9(H) 8.5(H) 7.8(H) 9.2(H)  Microalbumin mg/dL 2.8 - - - 0.5  Micro/Creat Ratio <30 mcg/mg creat 13 - - - -  Chol <200 mg/dL 250(H) 200(H) 245(H) 251(H) 219(H)  HDL > OR = 40 mg/dL 45 42 40 43 42  Calc LDL mg/dL (calc) 176(H) 129(H) 175(H) 166(H) 143(H)  Triglycerides <150 mg/dL 149 175(H) 152(H) 258(H) 172(H)  Creatinine 0.70 - 1.25 mg/dL 0.91 0.98 0.93 0.88 0.87   BP/Weight 01/21/2020 11/19/2019 09/30/2019 06/16/2019 05/29/2019 [id number]  Systolic BP Q000111Q 123456 123456 Q000111Q Q000111Q Q000111Q 0000000  Diastolic BP 78 78 78 60 58 58 65  Wt. (Lbs) 209.4 211 209 196.04 211 211 208  BMI 32.8 33.05 32.73 30.7 33.05 33.05 32.58   Foot/eye exam completion dates Latest Ref Rng & Units 02/25/2018 12/27/2016  Eye Exam No Retinopathy - -  Foot Form Completion - Done Done

## 2020-01-21 NOTE — Assessment & Plan Note (Signed)
Hyperlipidemia:Low fat diet discussed and encouraged.   Lipid Panel  Lab Results  Component Value Date   CHOL 250 (H) 10/08/2019   HDL 45 10/08/2019   LDLCALC 176 (H) 10/08/2019   TRIG 149 10/08/2019   CHOLHDL 5.6 (H) 10/08/2019   Updated lab needed at/ before next visit.

## 2020-01-21 NOTE — Assessment & Plan Note (Signed)
Improved  Updated pHQ 9 next visit

## 2020-01-21 NOTE — Progress Notes (Signed)
Mark Walls     MRN: [id number]      DOB: 05-11-1956   HPI Mark Walls is here for follow up and re-evaluation of chronic medical conditions, medication management and review of any available recent lab and radiology data.  Preventive health is updated, specifically  Cancer screening and Immunization.   Questions or concerns regarding consultations or procedures which the PT has had in the interim are  addressed. The PT denies any adverse reactions to current medications since the last visit.  2 week h/o"ead shifting side to side," nausea, loss of right eye vision, no incontinence, following this staggers,  Duration approx 2 mins, 3 episodes las week on the same day, in Dec happened twice in the month 2 week h/o daily headache , frontal rated at a 5  Blood sugar has been uncontrolled, sometimes up to 400  ROS Denies recent fever or chills. Denies sinus pressure, nasal congestion, ear pain or sore throat. Denies chest congestion, productive cough or wheezing. Denies chest pains, palpitations and leg swelling Denies abdominal pain, nausea, vomiting,diarrhea or constipation.   Denies dysuria, frequency, hesitancy or incontinence. C/o chronic  joint pain, swelling and limitation in mobility. Denies depression, anxiety or insomnia. Denies skin break down or rash.   PE  BP (!) 150/78 (BP Location: Left Arm)   Pulse 61   Temp 99 F (37.2 C) (Temporal)   Ht 5\' 7"  (1.702 m)   Wt 209 lb 6.4 oz (95 kg)   SpO2 94%   BMI 32.80 kg/m   Patient alert and oriented and in no cardiopulmonary distress.  HEENT: No facial asymmetry, EOMI,     Neck supple .carotid bruits  Present   Chest: Clear to auscultation bilaterally.  CVS: S1, S2 no murmurs, no S3.Regular rate.  ABD: Soft non tender.   Ext: No edema  MS: Decreased  ROM spine, shoulders, hips and knees.  Skin: Intact, no ulcerations or rash noted.  Psych: Good eye contact, normal affect. Memory intact not anxious or depressed  appearing.  CNS: CN 2-12 intact, power,  normal throughout.no focal deficits noted.   Assessment & Plan  Essential hypertension Uncontroled, increase chlorthalidone dose  DASH diet and commitment to daily physical activity for a minimum of 30 minutes discussed and encouraged, as a part of hypertension management. The importance of attaining a healthy weight is also discussed.  BP/Weight 01/21/2020 11/19/2019 09/30/2019 06/16/2019 05/29/2019 05/08/2019 03/24/2019  Systolic BP 150 148 148 132 150 150 160  Diastolic BP 78 78 78 60 58 58 65  Wt. (Lbs) 209.4 211 209 196.04 211 211 208  BMI 32.8 33.05 32.73 30.7 33.05 33.05 32.58       Type 2 diabetes mellitus with hyperglycemia [facility]) Mark Walls is reminded of the importance of commitment to daily physical activity for 30 minutes or more, as able and the need to limit carbohydrate intake to 30 to 60 grams per meal to help with blood sugar control.   The need to take medication as prescribed, test blood sugar as directed, and to call between visits if there is a concern that blood sugar is uncontrolled is also discussed.   Mark Walls is reminded of the importance of daily foot exam, annual eye examination, and good blood sugar, blood pressure and cholesterol control. Uncontrolled Updated lab needed at/ before next visit.  Diabetic Labs Latest Ref Rng & Units 10/08/2019 06/16/2019 02/21/2019 10/30/2018 07/04/2018  HbA1c <5.7 % of total Hgb 8.9(H) 7.9(H) 8.5(H) 7.8(H) 9.2(H)  Microalbumin mg/dL 2.8 - - - 0.5  Micro/Creat Ratio <30 mcg/mg creat 13 - - - -  Chol <200 mg/dL 161(W) 960(A) 540(J) 811(B) 219(H)  HDL > OR = 40 mg/dL 45 42 40 43 42  Calc LDL mg/dL (calc) 147(W) 295(A) 213(Y) 166(H) 143(H)  Triglycerides <150 mg/dL 865 784(O) 962(X) 528(U) 172(H)  Creatinine 0.70 - 1.25 mg/dL 1.32 4.40 1.02 7.25 3.66   BP/Weight 01/21/2020 11/19/2019 09/30/2019 06/16/2019 05/29/2019 05/08/2019 03/24/2019  Systolic BP 150 148 148 132 150 150 160  Diastolic  BP 78 78 78 60 58 58 65  Wt. (Lbs) 209.4 211 209 196.04 211 211 208  BMI 32.8 33.05 32.73 30.7 33.05 33.05 32.58   Foot/eye exam completion dates Latest Ref Rng & Units 02/25/2018 12/27/2016  Eye Exam No Retinopathy - -  Foot Form Completion - Done Done        Hyperlipidemia LDL goal <100 Hyperlipidemia:Low fat diet discussed and encouraged.   Lipid Panel  Lab Results  Component Value Date   CHOL 250 (H) 10/08/2019   HDL 45 10/08/2019   LDLCALC 176 (H) 10/08/2019   TRIG 149 10/08/2019   CHOLHDL 5.6 (H) 10/08/2019   Updated lab needed at/ before next visit.     Depression, major, single episode, severe (HCC) Improved  Updated pHQ 9 next visit  Light headed New headaches with episodes of light headedness and abnormal sensation in head as though head is shifting from side to side , followed by nausea and unsteady gait, recurrent and increasing in frequency needs carotid doppler, brain scan and Neurology referral

## 2020-01-21 NOTE — Patient Instructions (Signed)
F/u in office with MD in 6 weeks, call if you need me sooner  Increase chlorthalidone 25 mg tablet to one daily, blood pressure still high  You are referred for neck US, brain scan and to see Neurologist   Insulin is being refilled  Amphetamine is being addressed  We will discuss knee in the future when you want to see an Orthopedic Dr  Think about what you will eat, plan ahead. Choose " clean, green, fresh or frozen" over canned, processed or packaged foods which are more sugary, salty and fatty. 70 to 75% of food eaten should be vegetables and fruit. Three meals at set times with snacks allowed between meals, but they must be fruit or vegetables. Aim to eat over a 12 hour period , example 7 am to 7 pm, and STOP after  your last meal of the day. Drink water,generally about 64 ounces per day, no other drink is as healthy. Fruit juice is best enjoyed in a healthy way, by EATING the fruit. It is important that you exercise regularly at least 30 minutes 5 times a week. If you develop chest pain, have severe difficulty breathing, or feel very tired, stop exercising immediately and seek medical attention  Thanks for choosing [facility], we consider it a privelige to serve you.

## 2020-01-21 NOTE — Assessment & Plan Note (Signed)
Uncontroled, increase chlorthalidone dose  DASH diet and commitment to daily physical activity for a minimum of 30 minutes discussed and encouraged, as a part of hypertension management. The importance of attaining a healthy weight is also discussed.  BP/Weight 01/21/2020 11/19/2019 09/30/2019 06/16/2019 05/29/2019 [id number]  Systolic BP Q000111Q 123456 123456 Q000111Q Q000111Q Q000111Q 0000000  Diastolic BP 78 78 78 60 58 58 65  Wt. (Lbs) 209.4 211 209 196.04 211 211 208  BMI 32.8 33.05 32.73 30.7 33.05 33.05 32.58

## 2020-01-21 NOTE — Telephone Encounter (Signed)
Labs ordered.

## 2020-01-22 ENCOUNTER — Ambulatory Visit: Payer: Medicare Other | Admitting: Family Medicine

## 2020-01-22 LAB — LIPID PANEL
Cholesterol: 244 mg/dL — ABNORMAL HIGH (ref <200)
HDL: 44 mg/dL (ref >=40)
LDL Cholesterol (Calc): 161 mg/dL (calc) — ABNORMAL HIGH
Non-HDL Cholesterol (Calc): 200 mg/dL (calc) — ABNORMAL HIGH (ref <130)
Total CHOL/HDL Ratio: 5.5 (calc) — ABNORMAL HIGH (ref <5.0)
Triglycerides: 218 mg/dL — ABNORMAL HIGH (ref <150)

## 2020-01-22 LAB — COMPLETE METABOLIC PANEL WITH GFR
AG Ratio: 1.4 (calc) (ref 1.0–2.5)
ALT: 73 U/L — ABNORMAL HIGH (ref 9–46)
AST: 29 U/L (ref 10–35)
Albumin: 4.1 g/dL (ref 3.6–5.1)
Alkaline phosphatase (APISO): 62 U/L (ref 35–144)
BUN: 16 mg/dL (ref 7–25)
CO2: 30 mmol/L (ref 20–32)
Calcium: 10.6 mg/dL — ABNORMAL HIGH (ref 8.6–10.3)
Chloride: 103 mmol/L (ref 98–110)
Creat: 0.89 mg/dL (ref 0.70–1.25)
GFR, Est African American: 104 mL/min/{1.73_m2} (ref >=60)
GFR, Est Non African American: 90 mL/min/{1.73_m2} (ref >=60)
Globulin: 3 g/dL (calc) (ref 1.9–3.7)
Glucose, Bld: 212 mg/dL — ABNORMAL HIGH (ref 65–99)
Potassium: 4.9 mmol/L (ref 3.5–5.3)
Sodium: 137 mmol/L (ref 135–146)
Total Bilirubin: 0.4 mg/dL (ref 0.2–1.2)
Total Protein: 7.1 g/dL (ref 6.1–8.1)

## 2020-01-22 LAB — PSA: PSA: 1 ng/mL (ref <=4.0)

## 2020-01-22 LAB — HEMOGLOBIN A1C
Hgb A1c MFr Bld: 8.4 % of total Hgb — ABNORMAL HIGH (ref <5.7)
Mean Plasma Glucose: 194 (calc)
eAG (mmol/L): 10.8 (calc)

## 2020-01-23 LAB — MISC LABCORP TEST (SEND OUT): Labcorp test code: 738526

## 2020-01-29 ENCOUNTER — Other Ambulatory Visit: Payer: Self-pay

## 2020-02-03 ENCOUNTER — Other Ambulatory Visit: Payer: Self-pay

## 2020-02-03 ENCOUNTER — Ambulatory Visit (HOSPITAL_COMMUNITY)
Admission: RE | Admit: 2020-02-03 | Discharge: 2020-02-03 | Disposition: A | Discharge status: Home / Self Care | Payer: Medicare Other | Source: Ambulatory Visit | Attending: Family Medicine | Admitting: Family Medicine

## 2020-02-03 DIAGNOSIS — R42 Dizziness and giddiness: Secondary | ICD-10-CM | POA: Diagnosis present

## 2020-02-03 DIAGNOSIS — R519 Headache, unspecified: Secondary | ICD-10-CM

## 2020-02-03 DIAGNOSIS — R2681 Unsteadiness on feet: Secondary | ICD-10-CM | POA: Diagnosis present

## 2020-02-03 DIAGNOSIS — G8929 Other chronic pain: Secondary | ICD-10-CM | POA: Insufficient documentation

## 2020-02-16 ENCOUNTER — Other Ambulatory Visit: Payer: Self-pay | Admitting: Family Medicine

## 2020-02-23 ENCOUNTER — Telehealth: Payer: Self-pay

## 2020-02-23 NOTE — Telephone Encounter (Signed)
Pt came in to get his Prescription of Adderall XR 60 pills , no generic. I advised that I would send it over to the nurse to be completed, however no one is in the office today .  [facility] on [address].

## 2020-02-24 ENCOUNTER — Telehealth: Payer: Self-pay

## 2020-02-24 NOTE — Telephone Encounter (Signed)
At his last visit his adderall needed a preauthorization and I don't think Dr Chauncey Cruel sent but once script because of this. Can you please check and let me know what to tell patient?

## 2020-02-24 NOTE — Telephone Encounter (Signed)
Patient coming to leave urine for drug screen and appt to be made with Jarrett Soho

## 2020-02-24 NOTE — Telephone Encounter (Signed)
Called pt to advise that Mark Walls will need a specimen first, once the specimen is back we can make the appointment and have the pain contract signed.  At that point we could dispense the RX.  Pt understood.

## 2020-02-25 ENCOUNTER — Other Ambulatory Visit: Payer: Self-pay

## 2020-02-25 ENCOUNTER — Ambulatory Visit: Payer: Medicare Other

## 2020-02-25 DIAGNOSIS — Z0289 Encounter for other administrative examinations: Secondary | ICD-10-CM

## 2020-02-26 ENCOUNTER — Other Ambulatory Visit (HOSPITAL_COMMUNITY)
Admission: RE | Admit: 2020-02-26 | Discharge: 2020-02-26 | Disposition: A | Discharge status: Home / Self Care | Payer: Medicare Other | Source: Other Acute Inpatient Hospital | Attending: Family Medicine | Admitting: Family Medicine

## 2020-02-26 DIAGNOSIS — Z0289 Encounter for other administrative examinations: Secondary | ICD-10-CM | POA: Diagnosis present

## 2020-03-02 ENCOUNTER — Telehealth: Payer: Self-pay | Admitting: *Deleted

## 2020-03-02 ENCOUNTER — Ambulatory Visit: Payer: Medicare Other | Admitting: Diagnostic Neuroimaging

## 2020-03-02 LAB — MISC LABCORP TEST (SEND OUT): Labcorp test code: 738526

## 2020-03-02 NOTE — Telephone Encounter (Signed)
Patient was no show for new patient appointment today. 

## 2020-03-03 ENCOUNTER — Ambulatory Visit: Payer: Medicare Other | Admitting: Family Medicine

## 2020-03-03 ENCOUNTER — Encounter: Payer: Self-pay | Admitting: Diagnostic Neuroimaging

## 2020-03-10 ENCOUNTER — Encounter: Payer: Self-pay | Admitting: Family Medicine

## 2020-03-10 ENCOUNTER — Other Ambulatory Visit: Payer: Self-pay | Admitting: Family Medicine

## 2020-03-16 ENCOUNTER — Encounter: Payer: Self-pay | Admitting: Family Medicine

## 2020-03-16 ENCOUNTER — Other Ambulatory Visit: Payer: Self-pay

## 2020-03-16 ENCOUNTER — Ambulatory Visit (INDEPENDENT_AMBULATORY_CARE_PROVIDER_SITE_OTHER): Payer: Medicare Other | Admitting: Family Medicine

## 2020-03-16 VITALS — BP 160/70 | HR 81 | Temp 97.0°F | Resp 15 | Ht 67.0 in | Wt 212.0 lb

## 2020-03-16 DIAGNOSIS — Z79899 Other long term (current) drug therapy: Secondary | ICD-10-CM | POA: Diagnosis not present

## 2020-03-16 DIAGNOSIS — R5382 Chronic fatigue, unspecified: Secondary | ICD-10-CM | POA: Diagnosis not present

## 2020-03-16 DIAGNOSIS — I10 Essential (primary) hypertension: Secondary | ICD-10-CM | POA: Diagnosis not present

## 2020-03-16 DIAGNOSIS — F900 Attention-deficit hyperactivity disorder, predominantly inattentive type: Secondary | ICD-10-CM

## 2020-03-16 MED ORDER — AMPHETAMINE-DEXTROAMPHETAMINE 15 MG PO TABS: 15.0000 mg | ORAL_TABLET | Freq: Two times a day (BID) | ORAL | 0 refills | Status: DC

## 2020-03-16 MED ORDER — AMPHETAMINE-DEXTROAMPHETAMINE 30 MG PO TABS: 30.0000 mg | ORAL_TABLET | Freq: Two times a day (BID) | ORAL | 0 refills | Status: DC

## 2020-03-16 NOTE — Patient Instructions (Addendum)
I appreciate the opportunity to provide you with care for your health and wellness. Today we discussed: Adderall use/Refill  Follow up: 1 month    No labs or referrals today  Please continue to practice social distancing to keep you, your family, and our community safe.  If you must go out, please wear a mask and practice good handwashing.  It was a pleasure to see you and I look forward to continuing to work together on your health and well-being. Please do not hesitate to call the office if you need care or have questions about your care.  Have a wonderful day and week. With Gratitude, Cherly Beach, DNP, AGNP-BC

## 2020-03-16 NOTE — Progress Notes (Signed)
Subjective:  Patient ID: Mark Walls, male    DOB: Aug 18, 1956  Age: 64 y.o. MRN: [id number]  CC:  Chief Complaint  Patient presents with  . ADHD    12 week follow up with refill. Doesn't get generic.       HPI  HPI  Mr. Mark Walls is a 64 year old male patient of Dr. Lodema Hong.  Who presents today for follow-up on ADHD and refill of medications.  Urine drug screen scanned into the chart.  And copy sent over to pain management clinic.  Reports that he is doing well has no issues or concerns to discuss today. Blood pressure is still elevated.  Reports taking his medications as directed.  Denies have any chest pain, shortness of breath, vision changes, dizziness, palpitations, leg swelling or any other signs or symptoms of uncontrolled blood pressure.  He reports that his Adderall was increased to 60 mg daily at the last visit.  Secondary to elevated blood pressure he realizes that this will need to change.  And he is okay with that.  Just would like to have 2 doses throughout the day as this helps him with his focus and ability to move.  Today patient denies signs and symptoms of COVID 19 infection including fever, chills, cough, shortness of breath, and headache. Past Medical, Surgical, Social History, Allergies, and Medications have been Reviewed.   Past Medical History:  Diagnosis Date  . Anxiety   . Chronic fatigue   . Depression   . Diabetes mellitus, type 2 (HCC)   . ED (erectile dysfunction)   . Hyperlipidemia   . Hypertension   . Nicotine addiction   . Obesity   . PVD (peripheral vascular disease) (HCC)     No outpatient medications have been marked as taking for the 03/16/20 encounter (Office Visit) with Freddy Finner, NP.    ROS:  Review of Systems  Constitutional: Negative.   HENT: Negative.   Eyes: Negative.   Respiratory: Negative.   Cardiovascular: Negative.   Gastrointestinal: Negative.   Genitourinary: Negative.   Skin: Negative.   Neurological:  Negative.   Endo/Heme/Allergies: Negative.   Psychiatric/Behavioral: Negative.   All other systems reviewed and are negative.    Objective:   Today's Vitals: BP (!) 160/70   Pulse 81   Temp (!) 97 F (36.1 C) (Temporal)   Resp 15   Ht 5\' 7"  (1.702 m)   Wt 212 lb (96.2 kg)   SpO2 98%   BMI 33.20 kg/m  Vitals with BMI 03/16/2020 01/21/2020 11/19/2019  Height 5\' 7"  5\' 7"  5\' 7"   Weight 212 lbs 209 lbs 6 oz 211 lbs  BMI 33.2 32.79 33.04  Systolic 160 150 295  Diastolic 70 78 78  Pulse 81 61 -     Physical Exam Vitals and nursing note reviewed.  Constitutional:      Appearance: Normal appearance. He is well-developed and well-groomed. He is obese.  HENT:     Head: Normocephalic and atraumatic.     Right Ear: External ear normal.     Left Ear: External ear normal.     Mouth/Throat:     Comments: Mask in place Eyes:     General:        Right eye: No discharge.        Left eye: No discharge.     Conjunctiva/sclera: Conjunctivae normal.  Cardiovascular:     Rate and Rhythm: Normal rate and regular rhythm.     Pulses:  Normal pulses.     Heart sounds: Normal heart sounds.  Pulmonary:     Effort: Pulmonary effort is normal.     Breath sounds: Normal breath sounds.  Musculoskeletal:        General: Normal range of motion.     Cervical back: Normal range of motion and neck supple.  Skin:    General: Skin is warm.  Neurological:     General: No focal deficit present.     Mental Status: He is alert and oriented to person, place, and time.  Psychiatric:        Attention and Perception: Attention normal.        Mood and Affect: Mood normal.        Speech: Speech normal.        Behavior: Behavior normal. Behavior is cooperative.        Thought Content: Thought content normal.        Cognition and Memory: Cognition normal.        Judgment: Judgment normal.     Assessment   1. Attention deficit hyperactivity disorder (ADHD), predominantly inattentive type   2. Controlled  substance agreement signed   3. Chronic fatigue   4. Essential hypertension     Tests ordered No orders of the defined types were placed in this encounter.    Plan: Please see assessment and plan per problem list above.   Meds ordered this encounter  Medications  . DISCONTD: amphetamine-dextroamphetamine (ADDERALL) 30 MG tablet    Sig: Take 1 tablet by mouth 2 (two) times daily.    Dispense:  60 tablet    Refill:  0    Please provide with Brand name only.Thank you    Order Specific Question:   Supervising Provider    Answer:   Kerri Perches [[id number]]  . amphetamine-dextroamphetamine (ADDERALL) 15 MG tablet    Sig: Take 1 tablet by mouth 2 (two) times daily.    Dispense:  60 tablet    Refill:  0    Note dose change 15 mg twice daily. Brand Name Only    Order Specific Question:   Supervising Provider    Answer:   Genia Harold    Patient to follow-up in 04/13/2020.  Freddy Finner, NP

## 2020-03-17 NOTE — Assessment & Plan Note (Signed)
Form up to date. Random UDS understood by pt

## 2020-03-17 NOTE — Assessment & Plan Note (Signed)
Previously managed by Adderall, reduction in med, but given twice daily. BP is elevated and not well controlled stimulant is not advisable-so reduced dose started.

## 2020-03-17 NOTE — Assessment & Plan Note (Signed)
Not well controlled, reports taking medication. Will reduce Adderall and follow up next month if BP still not improved will adjust medication regimen.  Encouraged DASH diet and 30 mins of exercise daily

## 2020-03-17 NOTE — Assessment & Plan Note (Signed)
Continued Adderall at 15 mg doses twice daily, reduction-pt understands.

## 2020-03-24 ENCOUNTER — Other Ambulatory Visit: Payer: Self-pay | Admitting: Family Medicine

## 2020-03-31 ENCOUNTER — Ambulatory Visit: Payer: Medicare Other | Admitting: Family Medicine

## 2020-04-13 ENCOUNTER — Other Ambulatory Visit: Payer: Self-pay

## 2020-04-13 ENCOUNTER — Ambulatory Visit (INDEPENDENT_AMBULATORY_CARE_PROVIDER_SITE_OTHER): Payer: Medicare Other | Admitting: Family Medicine

## 2020-04-13 ENCOUNTER — Encounter: Payer: Self-pay | Admitting: Family Medicine

## 2020-04-13 VITALS — BP 162/80 | HR 77 | Temp 96.7°F | Resp 16 | Ht 67.0 in | Wt 209.0 lb

## 2020-04-13 DIAGNOSIS — F900 Attention-deficit hyperactivity disorder, predominantly inattentive type: Secondary | ICD-10-CM

## 2020-04-13 DIAGNOSIS — E669 Obesity, unspecified: Secondary | ICD-10-CM

## 2020-04-13 DIAGNOSIS — J3089 Other allergic rhinitis: Secondary | ICD-10-CM

## 2020-04-13 DIAGNOSIS — Z794 Long term (current) use of insulin: Secondary | ICD-10-CM

## 2020-04-13 DIAGNOSIS — R945 Abnormal results of liver function studies: Secondary | ICD-10-CM

## 2020-04-13 DIAGNOSIS — E1165 Type 2 diabetes mellitus with hyperglycemia: Secondary | ICD-10-CM | POA: Diagnosis not present

## 2020-04-13 DIAGNOSIS — R7989 Other specified abnormal findings of blood chemistry: Secondary | ICD-10-CM

## 2020-04-13 DIAGNOSIS — I10 Essential (primary) hypertension: Secondary | ICD-10-CM | POA: Diagnosis not present

## 2020-04-13 DIAGNOSIS — E785 Hyperlipidemia, unspecified: Secondary | ICD-10-CM | POA: Diagnosis not present

## 2020-04-13 DIAGNOSIS — K760 Fatty (change of) liver, not elsewhere classified: Secondary | ICD-10-CM

## 2020-04-13 MED ORDER — FLUTICASONE PROPIONATE 50 MCG/ACT NA SUSP: NASAL | 5 refills | Status: DC

## 2020-04-13 MED ORDER — LORATADINE 10 MG PO TABS: 10.0000 mg | ORAL_TABLET | Freq: Every day | ORAL | 0 refills | Status: DC

## 2020-04-13 MED ORDER — AMPHETAMINE-DEXTROAMPHETAMINE 15 MG PO TABS: 15.0000 mg | ORAL_TABLET | Freq: Two times a day (BID) | ORAL | 0 refills | Status: DC

## 2020-04-13 NOTE — Assessment & Plan Note (Signed)
Not at goal, hyperlipidemia encourage low-fat diet discussed extensively.  Is on Repatha.  Is being followed by Dr. Harl Bowie for this as well.  We will get follow-up labs in a month.

## 2020-04-13 NOTE — Assessment & Plan Note (Addendum)
Reviewed compliance with PDMP aware. Continued reduced dose at 50 mg twice daily.  Reduction is needed at this time secondary to blood pressure being uncontrolled.  Patient is aware of this and understands. Have asked him to revisit pain management needs with his pain management clinic.

## 2020-04-13 NOTE — Assessment & Plan Note (Signed)
Obesity is linked to hypertension, type 2 diabetes, hyperlipidemia  Mark Walls is re-educated about the importance of exercise daily to help with weight management. A minumum of 30 minutes daily is recommended. Additionally, importance of healthy food choices  with portion control discussed.   Wt Readings from Last 3 Encounters:  04/13/20 209 lb (94.8 kg)  03/16/20 212 lb (96.2 kg)  01/21/20 209 lb 6.4 oz (95 kg)

## 2020-04-13 NOTE — Assessment & Plan Note (Signed)
Has had worsening LFTs in the past.  Denied alcohol but has had chronic pain with Percocet use. Will be getting labs to see how he is doing at this time.  Additionally has been diagnosed with fatty liver and might need to be on TriCor to prevent any further worsening of his fatty liver if this is still continuingly prominent.

## 2020-04-13 NOTE — Patient Instructions (Signed)
I appreciate the opportunity to provide you with care for your health and wellness. Today we discussed: blood pressure and diabetes, alleriges  Follow up: 4 weeks -BP and DM   Labs-2-3 days before next appointment No referrals today  I have sent in your Adderall medication for 15 mg twice daily.  I will review both your diabetic and blood pressure medications and see what can be adjusted and/or change.  As we discussed if we cannot get your blood pressure under control with the medication changes we might send you back to Dr. Harl Bowie for assessment.  Additionally if I can get your diabetic numbers to come down or respond better to medication we will do a referral to endocrinology, Dr. Dorris Fetch who is right down the street.  Overall continue to eat a well-balanced diet low in carbohydrates, fats, and salts.  Please continue to practice social distancing to keep you, your family, and our community safe.  If you must go out, please wear a mask and practice good handwashing.  It was a pleasure to see you and I look forward to continuing to work together on your health and well-being. Please do not hesitate to call the office if you need care or have questions about your care.  Have a wonderful day and week. With Gratitude, Cherly Beach, DNP, AGNP-BC

## 2020-04-13 NOTE — Assessment & Plan Note (Addendum)
Not well controlled, will be getting updated labs in the next month.  We will be doing an adjustment to medications if medications do not benefit him with fasting blood sugars being in a better range at home.  Will consider referral to Dr. Dorris Fetch for assistance.  Changed to lantus, hoping for better control, will adjust lantus dosage as needed.

## 2020-04-13 NOTE — Assessment & Plan Note (Signed)
Will refill Flonase and start him on Claritin daily.  Reviewed side effects, risks and benefits of medication.   Patient acknowledged agreement and understanding of the plan.

## 2020-04-13 NOTE — Assessment & Plan Note (Addendum)
Blood pressure is not well controlled. Needs to have an adjustment to medications.  Appears to be malignant in nature.  Will review medications that have been prescribed in the past and see if there is anything that we can adjust or change.  If we are unable to get blood pressure well controlled we will have him follow-up with Dr. Harl Bowie.  Increased Lopressor-might need to drop another BP med and add a new class. Will see if this slight adjustment can get him better controlled. I do feel that stopping adderall might help it all together.

## 2020-04-13 NOTE — Assessment & Plan Note (Signed)
Will be following up on LFTs.  Encourage low-fat diet.  And to take medications.  Might need to be put on TriCor to help with prevention of worsening fatty liver.

## 2020-04-13 NOTE — Progress Notes (Signed)
Subjective:  Patient ID: Mark Walls, male    DOB: 10-28-1956  Age: 64 y.o. MRN: [id number]  CC:  Chief Complaint  Patient presents with  . ADHD    4 week follow up visit for refill  . Sinus Problem    sinus pressure and headache, gets worse at night, very little being blown out.   . Diabetes    states fasting sugar still runs about 180, sometimes higher      HPI  HPI  Mark Walls is a 64 year old male patient who presents back today for 4-week follow-up for refill of his Adderall in addition to following up on his blood pressure medicine and diabetes.  Blood pressure was elevated at last visit 160 over the 70s appears to be elevated again at this visit.  Reports that he has been taking his medications as directed and without any issue.  However his blood pressures do not change.  Reduce the amount of Adderall that he was on at previous visit secondary to thinking that that could be part of the cause but it does not appear to have changed anything.  He reports that if anything he is just felt more tired taking the lower dose of the Adderall.  Additionally he reports that he is having some sinus pressure and headaches to get worse at night.  He has some Flonase but is out of date.  Is not currently taking anything for allergies over-the-counter.  Denies have any excessive drainage ear pain or swallowing trouble.  Denies having any cough or shortness of breath related to these.  It is only been going on for about 3 to 4 days.  Blood sugars are still running elevated fasting in the mornings 180s sometimes higher.  He reports he ate a candy bar and his blood sugar was in the 400s for several days.  He is willing to have medication adjustment and if no improvement occurs he is willing to be referred to endocrinology.  Follow-up in May to see if the adjustment in medications today benefit him.  Today patient denies signs and symptoms of COVID 19 infection including fever, chills,  cough, shortness of breath, and headache. Past Medical, Surgical, Social History, Allergies, and Medications have been Reviewed.   Past Medical History:  Diagnosis Date  . Anxiety   . Chronic fatigue   . Depression   . Diabetes mellitus, type 2 (HCC)   . ED (erectile dysfunction)   . Hyperlipidemia   . Hypertension   . Nicotine addiction   . Obesity   . PVD (peripheral vascular disease) (HCC)     Current Meds  Medication Sig  . ACCU-CHEK AVIVA PLUS test strip USE TO CHECK BLOOD SUGAR FOUR TIMES A DAY.  Marland Kitchen ACCU-CHEK SOFTCLIX LANCETS lancets Test twice daily  . amLODipine (NORVASC) 10 MG tablet TAKE 1 TABLET(10 MG) BY MOUTH DAILY  . amphetamine-dextroamphetamine (ADDERALL) 15 MG tablet Take 1 tablet by mouth 2 (two) times daily.  Marland Kitchen aspirin 81 MG tablet Take 81 mg by mouth daily.  . Azelastine HCl 137 MCG/SPRAY SOLN SPRAY 2 SPRAYS INTO BOTH NOSTRILS TWICE DAILY AS DIRECTED.  Marland Kitchen Blood Glucose Monitoring Suppl (ACCU-CHEK AVIVA PLUS) w/Device KIT 1 kit for daily testing dx e11.9  . chlorthalidone (HYGROTON) 25 MG tablet Take one tablet by mouth once daily  . clotrimazole-betamethasone (LOTRISONE) cream Apply topically 2 (two) times daily.  . Evolocumab (REPATHA SURECLICK) 140 MG/ML SOAJ Inject 140 mg into the skin every 14 (fourteen)  days.  . ezetimibe (ZETIA) 10 MG tablet Take 1 tablet (10 mg total) by mouth daily.  Marland Kitchen FLUoxetine (PROZAC) 20 MG capsule Take 1 capsule (20 mg total) by mouth daily.  . fluticasone (FLONASE) 50 MCG/ACT nasal spray 2 SPRAYS INTO BOTH NOSTRILS DAILY.  Marland Kitchen glipiZIDE (GLUCOTROL XL) 10 MG 24 hr tablet Take two tablets every mornin by mouth  with breakfast  . Insulin Pen Needle (GLOBAL EASE INJECT PEN NEEDLES) 31G X 5 MM MISC USE WITH LANTUS DAILY.  Marland Kitchen losartan (COZAAR) 100 MG tablet TAKE 1 TABLET ONCE DAILY.  . metoprolol tartrate (LOPRESSOR) 25 MG tablet Take 1 tablet (25 mg total) by mouth 2 (two) times daily.  . naloxone (NARCAN) nasal spray 4 mg/0.1 mL Place 1  spray into the nose once. As needed for up to 1 dose  . oxyCODONE-acetaminophen (PERCOCET) 7.5-325 MG tablet Take 1 tablet by mouth every 6 (six) hours as needed for severe pain.  . prazosin (MINIPRESS) 1 MG capsule TAKE 1 CAPSULE BY MOUTH AT BEDTIME.  . sitaGLIPtin-metformin (JANUMET) 50-1000 MG tablet Take 1 tablet by mouth 2 (two) times daily with a meal.  . spironolactone (ALDACTONE) 25 MG tablet TAKE 1 TABLET(25 MG) BY MOUTH DAILY  . [DISCONTINUED] amphetamine-dextroamphetamine (ADDERALL) 15 MG tablet Take 1 tablet by mouth 2 (two) times daily.  . [DISCONTINUED] fluticasone (FLONASE) 50 MCG/ACT nasal spray 2 SPRAYS INTO BOTH NOSTRILS DAILY.  . [DISCONTINUED] LEVEMIR FLEXTOUCH 100 UNIT/ML Pen INJECT 50 UNITS INTO THE SKIN EVERY MORNING AND 20 UNITS UNDER THE SKIN EVERY EVENING  . [DISCONTINUED] metoprolol tartrate (LOPRESSOR) 25 MG tablet Take 0.5 tablets (12.5 mg total) by mouth 2 (two) times daily.    ROS:  Review of Systems  Constitutional: Negative.   HENT:       See HPI  Eyes: Negative.   Respiratory: Negative.   Cardiovascular: Negative.   Gastrointestinal: Negative.   Genitourinary: Negative.   Musculoskeletal: Negative.        Chronic pain  Skin: Negative.   Neurological: Negative.   Endo/Heme/Allergies: Negative.   Psychiatric/Behavioral: Negative.   All other systems reviewed and are negative.    Objective:   Today's Vitals: BP (!) 162/80   Pulse 77   Temp (!) 96.7 F (35.9 C) (Temporal)   Resp 16   Ht 5\' 7"  (1.702 m)   Wt 209 lb (94.8 kg)   SpO2 96%   BMI 32.73 kg/m  Vitals with BMI 04/13/2020 03/16/2020 01/21/2020  Height 5\' 7"  5\' 7"  5\' 7"   Weight 209 lbs 212 lbs 209 lbs 6 oz  BMI 32.73 33.2 32.79  Systolic 162 160 161  Diastolic 80 70 78  Pulse 77 81 61     Physical Exam Vitals and nursing note reviewed.  Constitutional:      Appearance: Normal appearance. He is well-developed and well-groomed. He is obese.  HENT:     Head: Normocephalic and  atraumatic.     Right Ear: External ear normal.     Left Ear: External ear normal.     Nose: Nose normal.     Mouth/Throat:     Mouth: Mucous membranes are moist.     Pharynx: Oropharynx is clear.     Comments: Mask in place Eyes:     General:        Right eye: No discharge.        Left eye: No discharge.     Conjunctiva/sclera: Conjunctivae normal.  Cardiovascular:     Rate and Rhythm:  Normal rate and regular rhythm.     Pulses: Normal pulses.     Heart sounds: Normal heart sounds.  Pulmonary:     Effort: Pulmonary effort is normal.     Breath sounds: Normal breath sounds.  Musculoskeletal:        General: Normal range of motion.     Cervical back: Normal range of motion and neck supple.  Skin:    General: Skin is warm.  Neurological:     General: No focal deficit present.     Mental Status: He is alert and oriented to person, place, and time.  Psychiatric:        Attention and Perception: Attention normal.        Mood and Affect: Mood normal.        Speech: Speech normal.        Behavior: Behavior normal. Behavior is cooperative.        Thought Content: Thought content normal.        Cognition and Memory: Cognition normal.        Judgment: Judgment normal.     Assessment   1. Type 2 diabetes mellitus with hyperglycemia, with long-term current use of insulin (HCC)   2. Attention deficit hyperactivity disorder (ADHD), predominantly inattentive type   3. Essential hypertension   4. Hyperlipidemia LDL goal <100   5. Fatty liver   6. Abnormal LFTs   7. Obesity (BMI 30.0-34.9)   8. Non-seasonal allergic rhinitis, unspecified trigger     Tests ordered Orders Placed This Encounter  Procedures  . CBC  . COMPLETE METABOLIC PANEL WITH GFR  . Hemoglobin A1c  . Lipid panel     Plan: Please see assessment and plan per problem list above.   Meds ordered this encounter  Medications  . amphetamine-dextroamphetamine (ADDERALL) 15 MG tablet    Sig: Take 1 tablet by  mouth 2 (two) times daily.    Dispense:  60 tablet    Refill:  0    Note dose change 15 mg twice daily. Brand Name Only    Order Specific Question:   Supervising Provider    Answer:   Syliva Overman E [[id number]]  . fluticasone (FLONASE) 50 MCG/ACT nasal spray    Sig: 2 SPRAYS INTO BOTH NOSTRILS DAILY.    Dispense:  16 g    Refill:  5    Order Specific Question:   Supervising Provider    Answer:   SIMPSON, MARGARET E [[id number]]  . loratadine (CLARITIN) 10 MG tablet    Sig: Take 1 tablet (10 mg total) by mouth daily.    Dispense:  90 tablet    Refill:  0    Order Specific Question:   Supervising Provider    Answer:   SIMPSON, MARGARET E [[id number]]  . insulin glargine (LANTUS) 100 UNIT/ML injection    Sig: Inject 0.3 mLs (30 Units total) into the skin daily after breakfast AND 0.3 mLs (30 Units total) at bedtime.    Dispense:  10 mL    Refill:  2    Order Specific Question:   Supervising Provider    Answer:   SIMPSON, MARGARET E [[id number]]  . metoprolol tartrate (LOPRESSOR) 25 MG tablet    Sig: Take 1 tablet (25 mg total) by mouth 2 (two) times daily.    Dispense:  30 tablet    Refill:  1    New 11/04/14    Order Specific Question:   Supervising Provider    Answer:  SIMPSON, MARGARET E [[id number]]    Patient to follow-up in 4 weeks   Freddy Finner, NP

## 2020-04-14 MED ORDER — INSULIN GLARGINE 100 UNIT/ML ~~LOC~~ SOLN: SUBCUTANEOUS | 2 refills | Status: DC

## 2020-04-14 MED ORDER — METOPROLOL TARTRATE 25 MG PO TABS: 25.0000 mg | ORAL_TABLET | Freq: Two times a day (BID) | ORAL | 1 refills | Status: DC

## 2020-04-22 ENCOUNTER — Other Ambulatory Visit: Payer: Self-pay

## 2020-04-22 DIAGNOSIS — E1165 Type 2 diabetes mellitus with hyperglycemia: Secondary | ICD-10-CM

## 2020-04-22 MED ORDER — INSULIN GLARGINE 100 UNIT/ML ~~LOC~~ SOLN: SUBCUTANEOUS | 3 refills | Status: DC

## 2020-05-11 ENCOUNTER — Ambulatory Visit: Payer: Medicare Other | Admitting: Family Medicine

## 2020-05-18 ENCOUNTER — Telehealth: Payer: Self-pay

## 2020-05-18 NOTE — Telephone Encounter (Signed)
Pt is calling wanting a refill of adderall. I advised I would send to the nurse

## 2020-05-19 NOTE — Telephone Encounter (Signed)
Has appt next week, and has oustanding lab order which includes fasting labs , hBA1C and uDS. This needs to be done on  6/3 or 6/4, once done I will rx the medication, if due, remind  him of the imporance of keeping appt next week also please

## 2020-05-19 NOTE — Telephone Encounter (Signed)
Left generic vm requesting return call back to discuss providers response.

## 2020-05-20 NOTE — Telephone Encounter (Signed)
Left message letting patient know he needs to get labs drawn by tomorrow and needs to stop by the office as well.

## 2020-05-21 NOTE — Telephone Encounter (Signed)
Noted, will address at visit  

## 2020-05-21 NOTE — Telephone Encounter (Signed)
Spoke with patient to let him know he needed to have labs drawn today. He stated he is out of town and will not be back until Tuesday. His appt is Tuesday at 3pm.

## 2020-05-25 ENCOUNTER — Other Ambulatory Visit: Payer: Self-pay

## 2020-05-25 ENCOUNTER — Ambulatory Visit (INDEPENDENT_AMBULATORY_CARE_PROVIDER_SITE_OTHER): Payer: Medicare Other | Admitting: Family Medicine

## 2020-05-25 ENCOUNTER — Encounter: Payer: Self-pay | Admitting: Family Medicine

## 2020-05-25 VITALS — BP 140/72 | HR 57 | Resp 15 | Ht 67.0 in | Wt 205.0 lb

## 2020-05-25 DIAGNOSIS — E785 Hyperlipidemia, unspecified: Secondary | ICD-10-CM | POA: Diagnosis not present

## 2020-05-25 DIAGNOSIS — E1165 Type 2 diabetes mellitus with hyperglycemia: Secondary | ICD-10-CM

## 2020-05-25 DIAGNOSIS — I10 Essential (primary) hypertension: Secondary | ICD-10-CM

## 2020-05-25 DIAGNOSIS — Z794 Long term (current) use of insulin: Secondary | ICD-10-CM

## 2020-05-25 DIAGNOSIS — F900 Attention-deficit hyperactivity disorder, predominantly inattentive type: Secondary | ICD-10-CM

## 2020-05-25 DIAGNOSIS — E669 Obesity, unspecified: Secondary | ICD-10-CM | POA: Diagnosis not present

## 2020-05-25 MED ORDER — AMPHETAMINE-DEXTROAMPHETAMINE 15 MG PO TABS
15.0000 mg | ORAL_TABLET | Freq: Two times a day (BID) | ORAL | 0 refills | Status: DC
Start: 2020-05-25 — End: 2022-03-03

## 2020-05-25 NOTE — Assessment & Plan Note (Addendum)
Mr. Mark Walls is reminded of the importance of commitment to daily physical activity for 30 minutes or more, as able and the need to limit carbohydrate intake to 30 to 60 grams per meal to help with blood sugar control.   The need to take medication as prescribed, test blood sugar as directed, and to call between Updated lab needed at/ before next visit.  visits if there is a concern that blood sugar is uncontrolled is also discussed.   Mr. Mark Walls is reminded of the importance of daily foot exam, annual eye examination, and good blood sugar, blood pressure and cholesterol control.  Diabetic Labs Latest Ref Rng & Units 01/21/2020 10/08/2019 06/16/2019 02/21/2019 10/30/2018  HbA1c <5.7 % of total Hgb 8.4(H) 8.9(H) 7.9(H) 8.5(H) 7.8(H)  Microalbumin mg/dL - 2.8 - - -  Micro/Creat Ratio <30 mcg/mg creat - 13 - - -  Chol <200 mg/dL 244(H) 250(H) 200(H) 245(H) 251(H)  HDL > OR = 40 mg/dL 44 45 42 40 43  Calc LDL mg/dL (calc) 161(H) 176(H) 129(H) 175(H) 166(H)  Triglycerides <150 mg/dL 218(H) 149 175(H) 152(H) 258(H)  Creatinine 0.70 - 1.25 mg/dL 0.89 0.91 0.98 0.93 0.88   BP/Weight 05/25/2020 04/13/2020 03/16/2020 01/21/2020 11/19/2019 09/30/2019 0/35/4656  Systolic BP 812 751 700 174 944 967 591  Diastolic BP 74 80 70 78 78 78 60  Wt. (Lbs) 205 209 212 209.4 211 209 196.04  BMI 32.11 32.73 33.2 32.8 33.05 32.73 30.7   Foot/eye exam completion dates Latest Ref Rng & Units 02/25/2018 12/27/2016  Eye Exam No Retinopathy - -  Foot Form Completion - Done Done

## 2020-05-25 NOTE — Patient Instructions (Addendum)
F/U in 12 weeks, call if you need me before  You NEED to submit urine next week as we discussed 1 month only of adderall is prescribed today  Please check feet daily  INCREASE metoprolol, take as directed , TWO times daily 10 am and 10 pm, blood pressure is still too high  You are referred for eye exam, you need this   I recommend you get the covid vaccines  It is important that you exercise regularly at least 30 minutes 5 times a week. If you develop chest pain, have severe difficulty breathing, or feel very tired, stop exercising immediately and seek medical attention   Think about what you will eat, plan ahead. Choose " clean, green, fresh or frozen" over canned, processed or packaged foods which are more sugary, salty and fatty. 70 to 75% of food eaten should be vegetables and fruit. Three meals at set times with snacks allowed between meals, but they must be fruit or vegetables. Aim to eat over a 12 hour period , example 7 am to 7 pm, and STOP after  your last meal of the day. Drink water,generally about 64 ounces per day, no other drink is as healthy. Fruit juice is best enjoyed in a healthy way, by EATING the fruit. Thanks for choosing [facility], we consider it a privelige to serve you.

## 2020-05-26 LAB — CBC
HCT: 46.2 % (ref 38.5–50.0)
Hemoglobin: 15.6 g/dL (ref 13.2–17.1)
MCH: 30.9 pg (ref 27.0–33.0)
MCHC: 33.8 g/dL (ref 32.0–36.0)
MCV: 91.5 fL (ref 80.0–100.0)
MPV: 11 fL (ref 7.5–12.5)
Platelets: 232 10*3/uL (ref 140–400)
RBC: 5.05 10*6/uL (ref 4.20–5.80)
RDW: 12.4 % (ref 11.0–15.0)
WBC: 8.6 10*3/uL (ref 3.8–10.8)

## 2020-05-26 LAB — LIPID PANEL
Cholesterol: 240 mg/dL — ABNORMAL HIGH (ref <200)
HDL: 42 mg/dL (ref >=40)
LDL Cholesterol (Calc): 160 mg/dL (calc) — ABNORMAL HIGH
Non-HDL Cholesterol (Calc): 198 mg/dL (calc) — ABNORMAL HIGH (ref <130)
Total CHOL/HDL Ratio: 5.7 (calc) — ABNORMAL HIGH (ref <5.0)
Triglycerides: 215 mg/dL — ABNORMAL HIGH (ref <150)

## 2020-05-26 LAB — COMPLETE METABOLIC PANEL WITH GFR
AG Ratio: 1.5 (calc) (ref 1.0–2.5)
ALT: 98 U/L — ABNORMAL HIGH (ref 9–46)
AST: 40 U/L — ABNORMAL HIGH (ref 10–35)
Albumin: 4.1 g/dL (ref 3.6–5.1)
Alkaline phosphatase (APISO): 67 U/L (ref 35–144)
BUN: 22 mg/dL (ref 7–25)
CO2: 29 mmol/L (ref 20–32)
Calcium: 10.3 mg/dL (ref 8.6–10.3)
Chloride: 102 mmol/L (ref 98–110)
Creat: 0.86 mg/dL (ref 0.70–1.25)
GFR, Est African American: 105 mL/min/{1.73_m2} (ref >=60)
GFR, Est Non African American: 91 mL/min/{1.73_m2} (ref >=60)
Globulin: 2.8 g/dL (calc) (ref 1.9–3.7)
Glucose, Bld: 186 mg/dL — ABNORMAL HIGH (ref 65–99)
Potassium: 4.9 mmol/L (ref 3.5–5.3)
Sodium: 137 mmol/L (ref 135–146)
Total Bilirubin: 0.8 mg/dL (ref 0.2–1.2)
Total Protein: 6.9 g/dL (ref 6.1–8.1)

## 2020-05-26 LAB — HEMOGLOBIN A1C
Hgb A1c MFr Bld: 9 % of total Hgb — ABNORMAL HIGH (ref <5.7)
Mean Plasma Glucose: 212 (calc)
eAG (mmol/L): 11.7 (calc)

## 2020-05-31 NOTE — Progress Notes (Signed)
Referral placed.

## 2020-06-01 ENCOUNTER — Other Ambulatory Visit: Payer: Self-pay

## 2020-06-01 ENCOUNTER — Ambulatory Visit: Payer: Medicare Other

## 2020-06-01 ENCOUNTER — Telehealth: Payer: Self-pay

## 2020-06-01 DIAGNOSIS — Z0289 Encounter for other administrative examinations: Secondary | ICD-10-CM

## 2020-06-01 DIAGNOSIS — Z79899 Other long term (current) drug therapy: Secondary | ICD-10-CM

## 2020-06-01 NOTE — Progress Notes (Signed)
Left UDS for testing

## 2020-06-02 ENCOUNTER — Other Ambulatory Visit (HOSPITAL_COMMUNITY): Payer: Self-pay | Admitting: Family Medicine

## 2020-06-02 ENCOUNTER — Other Ambulatory Visit (HOSPITAL_COMMUNITY)
Admission: RE | Admit: 2020-06-02 | Discharge: 2020-06-02 | Disposition: A | Discharge status: Home / Self Care | Payer: Medicare Other | Source: Other Acute Inpatient Hospital | Attending: Family Medicine | Admitting: Family Medicine

## 2020-06-02 DIAGNOSIS — Z79899 Other long term (current) drug therapy: Secondary | ICD-10-CM | POA: Diagnosis not present

## 2020-06-02 DIAGNOSIS — Z0289 Encounter for other administrative examinations: Secondary | ICD-10-CM | POA: Insufficient documentation

## 2020-06-06 LAB — MISC LABCORP TEST (SEND OUT): Labcorp test code: 738526

## 2020-06-06 NOTE — Assessment & Plan Note (Signed)
Hyperlipidemia:Low fat diet discussed and encouraged.   Lipid Panel  Lab Results  Component Value Date   CHOL 240 (H) 05/25/2020   HDL 42 05/25/2020   LDLCALC 160 (H) 05/25/2020   TRIG 215 (H) 05/25/2020   CHOLHDL 5.7 (H) 05/25/2020   nes to follow through on treatment plan through lipid clinic

## 2020-06-06 NOTE — Assessment & Plan Note (Signed)
Needs UDS to verify compliance, 1 month only of medication is prescribed, he is aware of need to submit specimen in the next 1 week, he has been out of medication , confirmed by record review

## 2020-06-06 NOTE — Assessment & Plan Note (Signed)
  Patient re-educated about  the importance of commitment to a  minimum of 150 minutes of exercise per week as able.  The importance of healthy food choices with portion control discussed, as well as eating regularly and within a 12 hour window most days. The need to choose "clean , green" food 50 to 75% of the time is discussed, as well as to make water the primary drink and set a goal of 64 ounces water daily.    Weight /BMI 05/25/2020 04/13/2020 03/16/2020  WEIGHT 205 lb 209 lb 212 lb  HEIGHT 5\' 7"  5\' 7"  5\' 7"   BMI 32.11 kg/m2 32.73 kg/m2 33.2 kg/m2

## 2020-06-06 NOTE — Assessment & Plan Note (Signed)
Uncontrolled, not taking medication as prescribed DASH diet and commitment to daily physical activity for a minimum of 30 minutes discussed and encouraged, as a part of hypertension management. The importance of attaining a healthy weight is also discussed.  BP/Weight 05/25/2020 04/13/2020 03/16/2020 01/21/2020 11/19/2019 09/30/2019 2/70/3500  Systolic BP 938 182 [phone] 810  Diastolic BP 72 80 70 78 78 78 60  Wt. (Lbs) 205 209 212 209.4 211 209 196.04  BMI 32.11 32.73 33.2 32.8 33.05 32.73 30.7

## 2020-06-06 NOTE — Progress Notes (Signed)
Mark Walls     MRN: [id number]      DOB: 10-04-1956   HPI Mark Walls is here for follow up and re-evaluation of chronic medical conditions, medication management and review of any available recent lab and radiology data.  Preventive health is updated, specifically  Cancer screening and Immunization.   Questions or concerns regarding consultations or procedures which the PT has had in the interim are  addressed. The PT denies any adverse reactions to current medications since the last visit.  There are no new concerns.  There are no specific complaints   ROS Denies recent fever or chills. Denies sinus pressure, nasal congestion, ear pain or sore throat. Denies chest congestion, productive cough or wheezing. Denies chest pains, palpitations and leg swelling Denies abdominal pain, nausea, vomiting,diarrhea or constipation.   Denies dysuria, frequency, hesitancy or incontinence. Denies joint pain, swelling and limitation in mobility. Denies headaches, seizures, numbness, or tingling. Denies depression, anxiety or insomnia. Denies skin break down or rash.   PE  BP 140/72   Pulse (!) 57   Resp 15   Ht 5\' 7"  (1.702 m)   Wt 205 lb (93 kg)   SpO2 96%   BMI 32.11 kg/m   Patient alert and oriented and in no cardiopulmonary distress.  HEENT: No facial asymmetry, EOMI,     Neck supple .  Chest: Clear to auscultation bilaterally.  CVS: S1, S2 no murmurs, no S3.Regular rate.  ABD: Soft non tender.   Ext: No edema  MS: Adequate ROM spine, shoulders, hips and knees.  Skin: Intact, no ulcerations or rash noted.  Psych: Good eye contact, normal affect. Memory intact not anxious or depressed appearing.  CNS: CN 2-12 intact, power,  normal throughout.no focal deficits noted.   Assessment & Plan  Type 2 diabetes mellitus with hyperglycemia [facility]) Mark Walls is reminded of the importance of commitment to daily physical activity for 30 minutes or more, as able and the need  to limit carbohydrate intake to 30 to 60 grams per meal to help with blood sugar control.   The need to take medication as prescribed, test blood sugar as directed, and to call between Updated lab needed at/ before next visit.  visits if there is a concern that blood sugar is uncontrolled is also discussed.   Mark Walls is reminded of the importance of daily foot exam, annual eye examination, and good blood sugar, blood pressure and cholesterol control.  Diabetic Labs Latest Ref Rng & Units 01/21/2020 10/08/2019 06/16/2019 02/21/2019 10/30/2018  HbA1c <5.7 % of total Hgb 8.4(H) 8.9(H) 7.9(H) 8.5(H) 7.8(H)  Microalbumin mg/dL - 2.8 - - -  Micro/Creat Ratio <30 mcg/mg creat - 13 - - -  Chol <200 mg/dL 409(W) 119(J) 478(G) 956(O) 251(H)  HDL > OR = 40 mg/dL 44 45 42 40 43  Calc LDL mg/dL (calc) 130(Q) 657(Q) 469(G) 175(H) 166(H)  Triglycerides <150 mg/dL 295(M) 841 324(M) 010(U) 258(H)  Creatinine 0.70 - 1.25 mg/dL 7.25 3.66 4.40 3.47 4.25   BP/Weight 05/25/2020 04/13/2020 03/16/2020 01/21/2020 11/19/2019 09/30/2019 06/16/2019  Systolic BP 154 162 160 150 148 148 132  Diastolic BP 74 80 70 78 78 78 60  Wt. (Lbs) 205 209 212 209.4 211 209 196.04  BMI 32.11 32.73 33.2 32.8 33.05 32.73 30.7   Foot/eye exam completion dates Latest Ref Rng & Units 02/25/2018 12/27/2016  Eye Exam No Retinopathy - -  Foot Form Completion - Done Done  Essential hypertension Uncontrolled, not taking medication as prescribed DASH diet and commitment to daily physical activity for a minimum of 30 minutes discussed and encouraged, as a part of hypertension management. The importance of attaining a healthy weight is also discussed.  BP/Weight 05/25/2020 04/13/2020 03/16/2020 01/21/2020 11/19/2019 09/30/2019 06/16/2019  Systolic BP 140 162 160 150 148 148 132  Diastolic BP 72 80 70 78 78 78 60  Wt. (Lbs) 205 209 212 209.4 211 209 196.04  BMI 32.11 32.73 33.2 32.8 33.05 32.73 30.7       Obesity (BMI  30.0-34.9)  Patient re-educated about  the importance of commitment to a  minimum of 150 minutes of exercise per week as able.  The importance of healthy food choices with portion control discussed, as well as eating regularly and within a 12 hour window most days. The need to choose "clean , green" food 50 to 75% of the time is discussed, as well as to make water the primary drink and set a goal of 64 ounces water daily.    Weight /BMI 05/25/2020 04/13/2020 03/16/2020  WEIGHT 205 lb 209 lb 212 lb  HEIGHT 5\' 7"  5\' 7"  5\' 7"   BMI 32.11 kg/m2 32.73 kg/m2 33.2 kg/m2      Hyperlipidemia LDL goal <100 Hyperlipidemia:Low fat diet discussed and encouraged.   Lipid Panel  Lab Results  Component Value Date   CHOL 240 (H) 05/25/2020   HDL 42 05/25/2020   LDLCALC 160 (H) 05/25/2020   TRIG 215 (H) 05/25/2020   CHOLHDL 5.7 (H) 05/25/2020   nes to follow through on treatment plan through lipid clinic    ADHD Needs UDS to verify compliance, 1 month only of medication is prescribed, he is aware of need to submit specimen in the next 1 week, he has been out of medication , confirmed by record review

## 2020-06-07 ENCOUNTER — Other Ambulatory Visit: Payer: Self-pay | Admitting: Family Medicine

## 2020-06-21 ENCOUNTER — Other Ambulatory Visit: Payer: Self-pay | Admitting: Family Medicine

## 2020-06-22 ENCOUNTER — Other Ambulatory Visit: Payer: Self-pay | Admitting: Family Medicine

## 2020-06-22 DIAGNOSIS — I10 Essential (primary) hypertension: Secondary | ICD-10-CM

## 2020-06-24 ENCOUNTER — Telehealth: Payer: Self-pay | Admitting: *Deleted

## 2020-06-24 ENCOUNTER — Telehealth: Payer: Self-pay | Admitting: Family Medicine

## 2020-06-24 NOTE — Telephone Encounter (Signed)
Message already sent to provider

## 2020-06-24 NOTE — Telephone Encounter (Signed)
Pt is requesting refill on adderall had an appointment June 8 has another appointment in August would you like to refill?

## 2020-06-27 ENCOUNTER — Other Ambulatory Visit: Payer: Self-pay | Admitting: Family Medicine

## 2020-06-27 NOTE — Telephone Encounter (Signed)
Please let him know that based on recent UDS I will no longer prescribe adderall. I can explain during a phone visit if he needs one, otherwise, keep f/u in August

## 2020-06-28 ENCOUNTER — Other Ambulatory Visit: Payer: Self-pay

## 2020-06-28 DIAGNOSIS — F900 Attention-deficit hyperactivity disorder, predominantly inattentive type: Secondary | ICD-10-CM

## 2020-06-28 NOTE — Telephone Encounter (Signed)
Mark Walls let pt know that we no longer will be prescribing this medication with verbal understanding

## 2020-07-22 ENCOUNTER — Telehealth: Payer: Self-pay

## 2020-07-22 ENCOUNTER — Other Ambulatory Visit: Payer: Self-pay

## 2020-07-22 MED ORDER — ACCU-CHEK AVIVA PLUS VI STRP: ORAL_STRIP | 3 refills | Status: DC

## 2020-07-22 NOTE — Telephone Encounter (Signed)
Strips refilled and sent to pharmacy

## 2020-07-22 NOTE — Telephone Encounter (Signed)
Pt needs refill on test strips

## 2020-08-11 ENCOUNTER — Ambulatory Visit: Payer: Medicare Other | Admitting: Family Medicine

## 2020-08-17 ENCOUNTER — Telehealth: Payer: Self-pay | Admitting: Family Medicine

## 2020-08-17 ENCOUNTER — Other Ambulatory Visit: Payer: Self-pay

## 2020-08-17 DIAGNOSIS — Z794 Long term (current) use of insulin: Secondary | ICD-10-CM

## 2020-08-17 MED ORDER — INSULIN GLARGINE 100 UNIT/ML ~~LOC~~ SOLN: SUBCUTANEOUS | 3 refills | Status: DC

## 2020-08-17 NOTE — Telephone Encounter (Signed)
Patient called is out of insulin and test strips call back # is [phone]

## 2020-08-17 NOTE — Telephone Encounter (Signed)
Patients insulin refilled. Strips were refilled 8/5 with 3 additional refills. Patient will recheck at the pharmacy for them.

## 2020-09-20 ENCOUNTER — Other Ambulatory Visit: Payer: Self-pay | Admitting: Family Medicine

## 2020-10-30 ENCOUNTER — Other Ambulatory Visit: Payer: Self-pay | Admitting: Family Medicine

## 2020-10-30 DIAGNOSIS — I10 Essential (primary) hypertension: Secondary | ICD-10-CM

## 2020-11-12 IMAGING — MR MR HEAD W/O CM
7 of 11 series · 28 of 48 positions shown · non-contrast
Comparison: None.

CLINICAL DATA: Headache

EXAM:
MRI HEAD WITHOUT CONTRAST
TECHNIQUE: Multiplanar, multiecho pulse sequences of the brain and surrounding
structures were obtained without intravenous contrast.

[Series 4: DWI · axial · 3.0mm · 0.78mm/px · z∈[-78,+81]mm · 7 of 55 slices shown (1 of 2)]
[im 1/55]
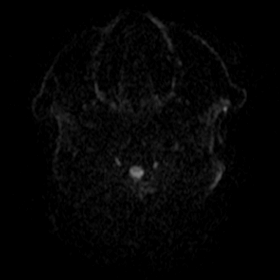
[im 10/55]
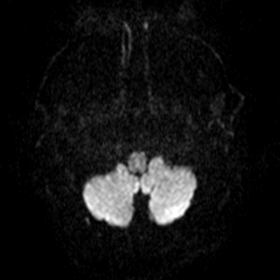
[im 19/55]
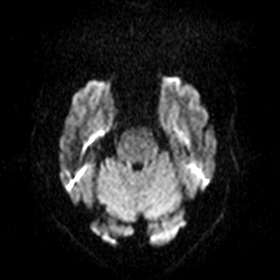
[im 28/55]
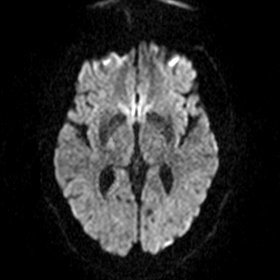
[im 37/55]
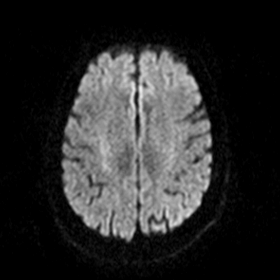
[im 46/55]
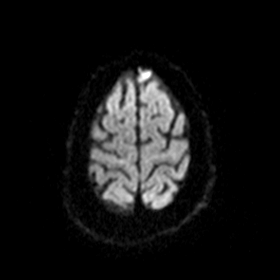
[im 55/55]
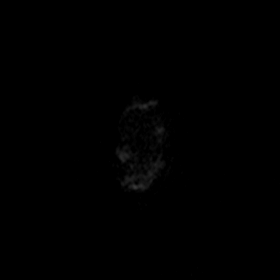

[Series 6: DWI · coronal · 5.0mm · 0.48mm/px · 4 of 38 slices shown (2 of 2)]
[im 1/38]
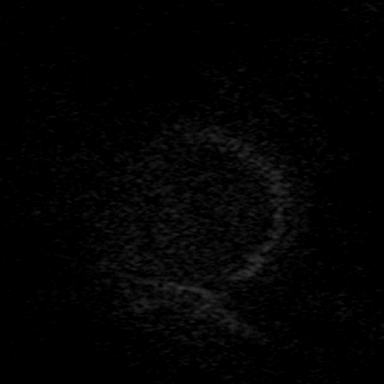
[im 13/38]
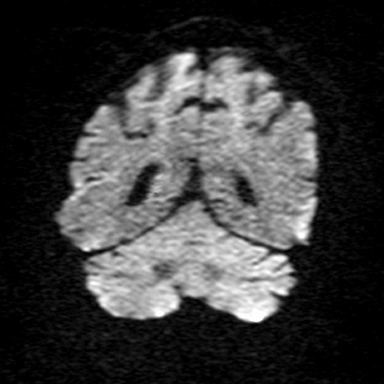
[im 25/38]
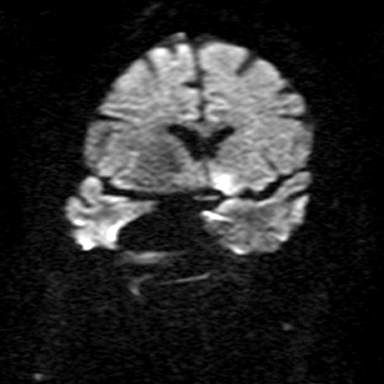
[im 38/38]
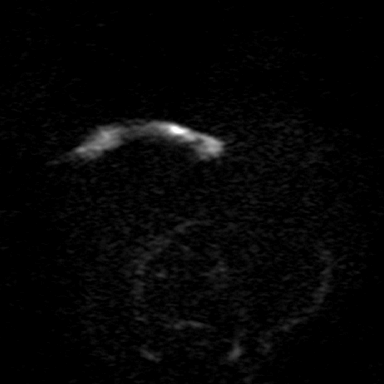

[Series 9: T2 · axial · 5.0mm · 0.49mm/px · z∈[-68,+73]mm · 3 of 23 slices shown (1 of 3)]
[im 1/23]
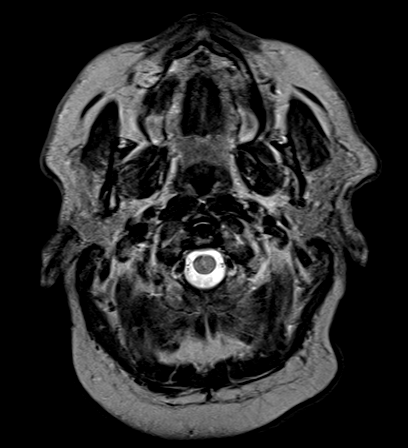
[im 12/23]
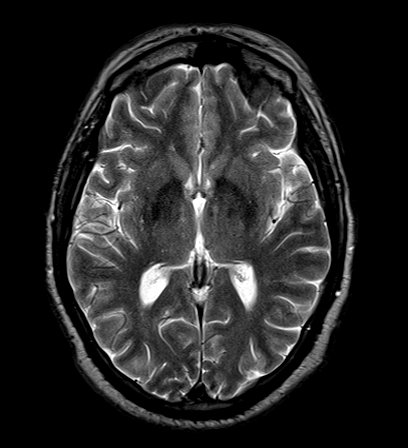
[im 23/23]
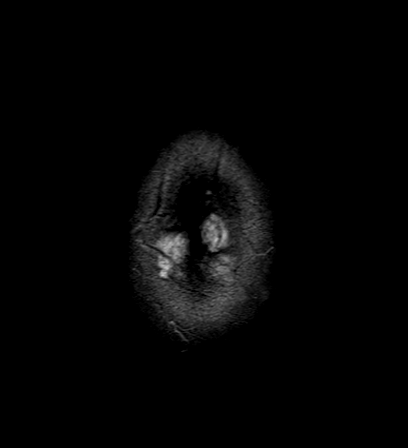

[Series 10: T2 · axial · 4.0mm · 0.43mm/px · z∈[-69,+74]mm · 3 of 30 slices shown (2 of 3)]
[im 1/30]
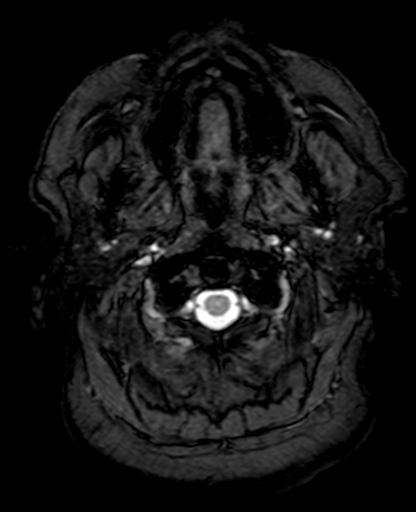
[im 15/30]
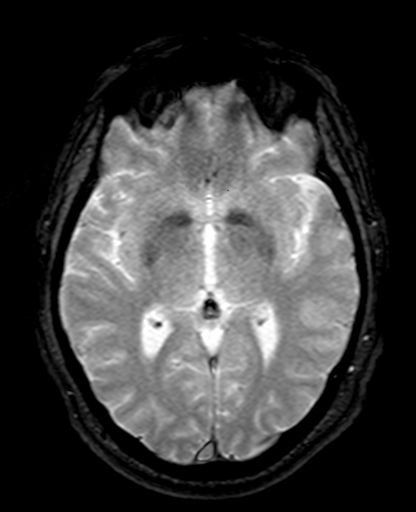
[im 30/30]
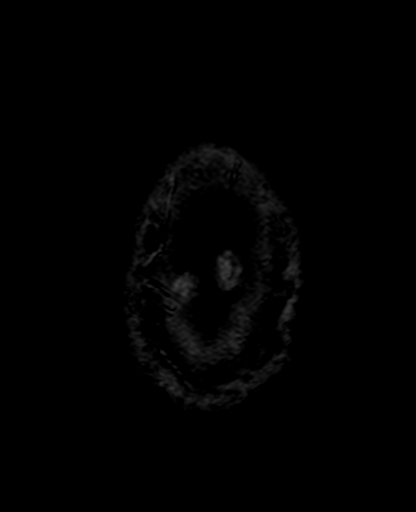

[Series 11: FLAIR · axial · 3.0mm · 0.33mm/px · z∈[-66,+70]mm · 5 of 47 slices shown (1 of 2)]
[im 1/47]
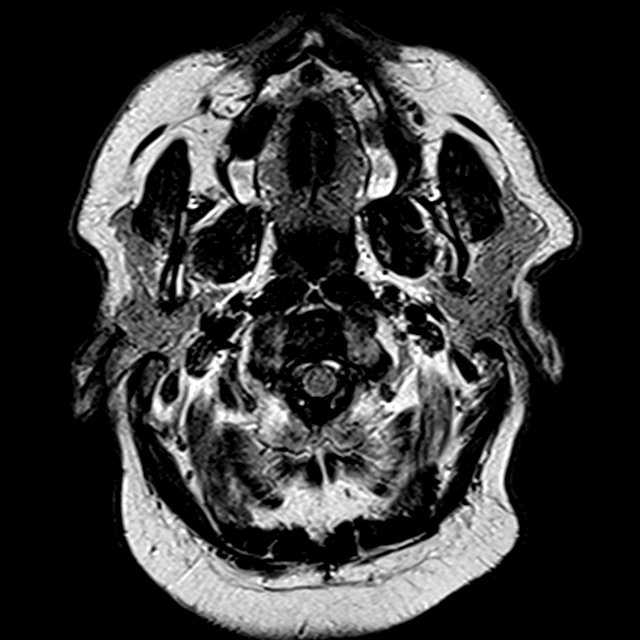
[im 12/47]
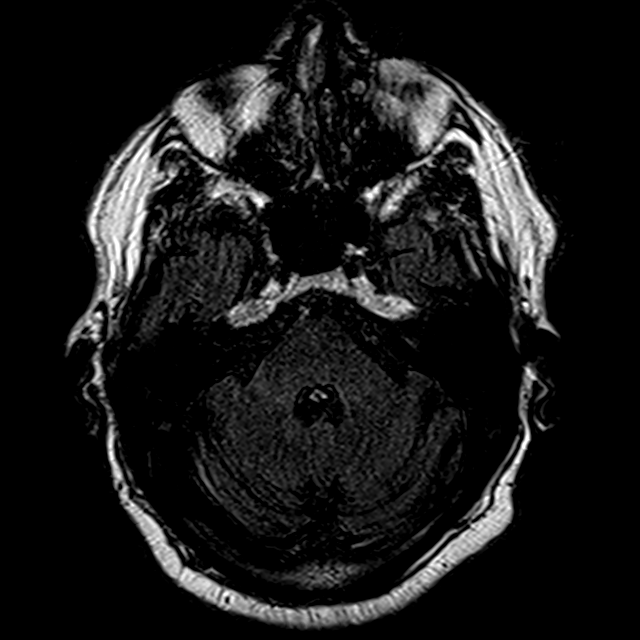
[im 24/47]
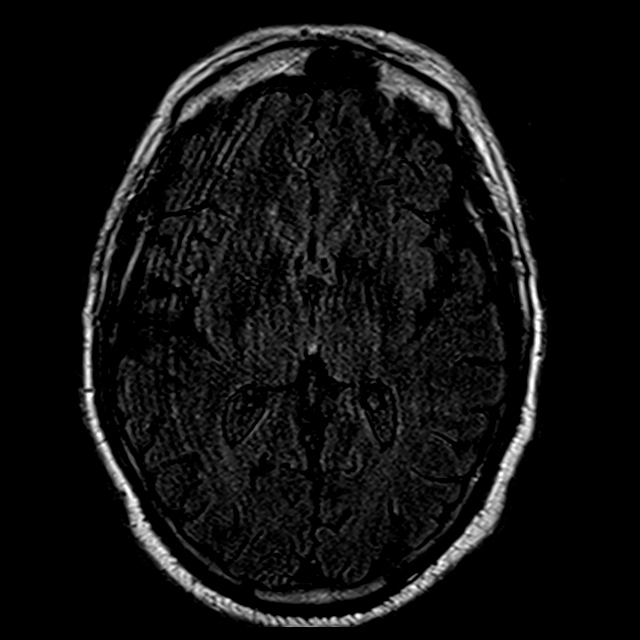
[im 35/47]
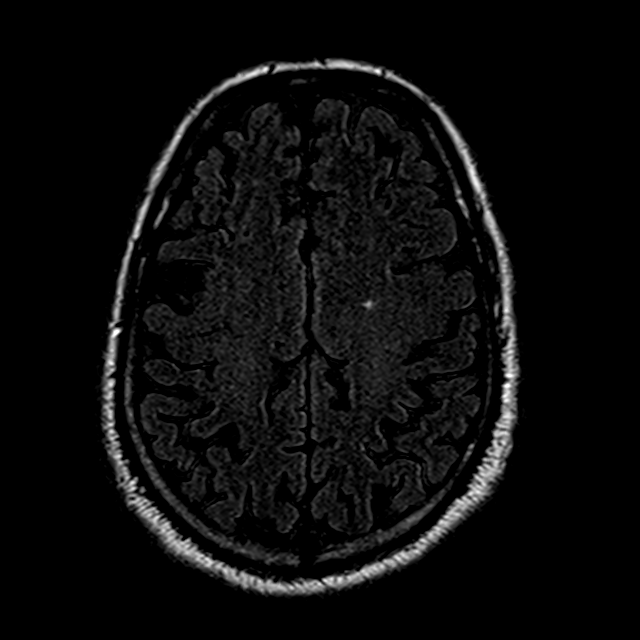
[im 47/47]
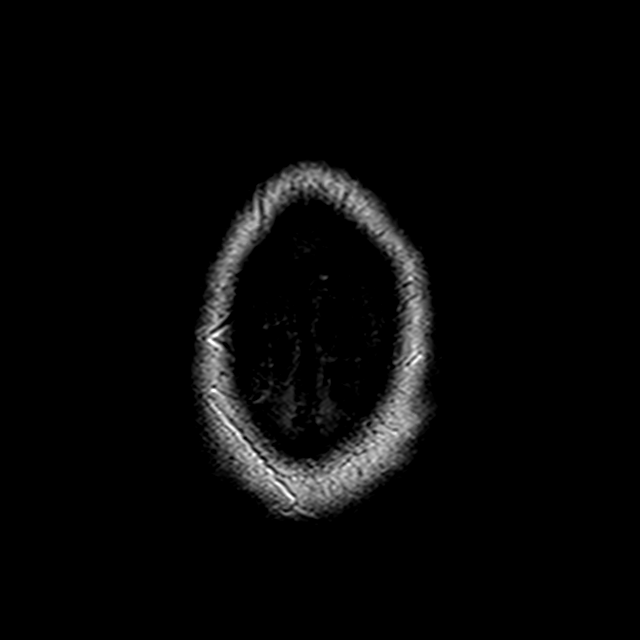

[Series 13: T2 · coronal · 5.0mm · 0.46mm/px · 3 of 28 slices shown (3 of 3)]
[im 1/28]
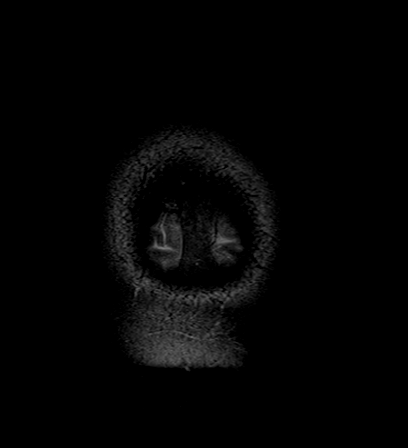
[im 14/28]
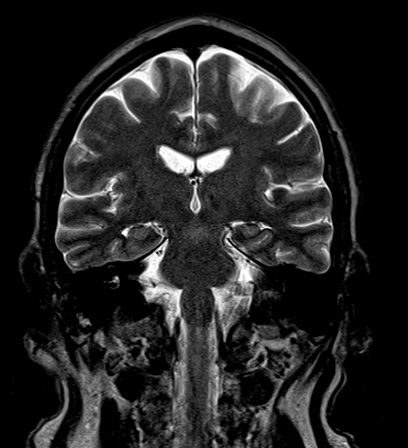
[im 28/28]
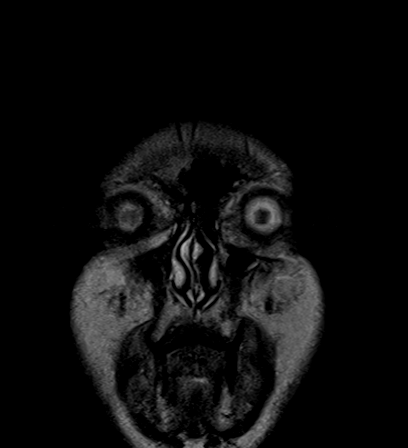

[Series 14: FLAIR · axial · 5.0mm · 0.85mm/px · z∈[-81,+86]mm · 3 of 27 slices shown (2 of 2)]
[im 1/27]
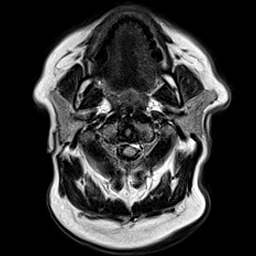
[im 14/27]
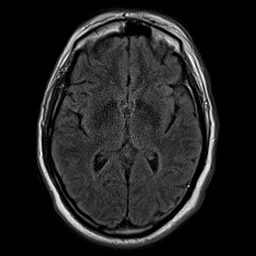
[im 27/27]
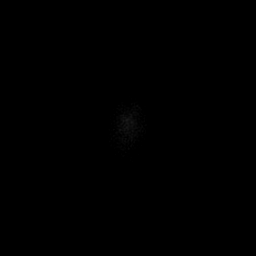

[28 of 48 positions shown; findings below may reference images not displayed]

FINDINGS: BRAIN: No acute infarct, acute hemorrhage or extra-axial collection.
Normal white matter signal for age. Normal volume of brain
parenchyma and CSF spaces. Midline structures are normal.

VASCULAR: Major flow voids are preserved. Susceptibility-sensitive
sequences show no chronic microhemorrhage or superficial siderosis.

SKULL AND UPPER CERVICAL SPINE: Normal calvarium and skull base.
Visualized upper cervical spine and soft tissues are normal.

SINUSES/ORBITS: No fluid levels or advanced mucosal thickening. No
mastoid or middle ear effusion. Normal orbits.
IMPRESSION: Normal brain MRI.

## 2020-11-15 ENCOUNTER — Other Ambulatory Visit: Payer: Self-pay | Admitting: Family Medicine

## 2020-11-23 ENCOUNTER — Other Ambulatory Visit: Payer: Self-pay | Admitting: Family Medicine

## 2020-12-21 ENCOUNTER — Other Ambulatory Visit: Payer: Self-pay | Admitting: Family Medicine

## 2020-12-25 ENCOUNTER — Other Ambulatory Visit: Payer: Self-pay | Admitting: Family Medicine

## 2021-01-13 ENCOUNTER — Other Ambulatory Visit: Payer: Self-pay | Admitting: Family Medicine

## 2021-01-21 ENCOUNTER — Other Ambulatory Visit: Payer: Self-pay | Admitting: Family Medicine

## 2021-02-07 ENCOUNTER — Other Ambulatory Visit: Payer: Self-pay | Admitting: Family Medicine

## 2021-02-07 DIAGNOSIS — I10 Essential (primary) hypertension: Secondary | ICD-10-CM

## 2021-02-17 ENCOUNTER — Telehealth: Payer: Self-pay

## 2021-02-17 NOTE — Telephone Encounter (Signed)
LVM to schedule virtual appt with Dr.Simpson for a follow up pt last seen June 2021

## 2021-02-23 ENCOUNTER — Other Ambulatory Visit: Payer: Self-pay | Admitting: Family Medicine

## 2021-03-22 ENCOUNTER — Other Ambulatory Visit: Payer: Self-pay | Admitting: Family Medicine

## 2021-03-29 ENCOUNTER — Other Ambulatory Visit: Payer: Self-pay | Admitting: Family Medicine

## 2021-03-30 ENCOUNTER — Telehealth: Payer: Self-pay | Admitting: Family Medicine

## 2021-03-30 NOTE — Telephone Encounter (Signed)
Left message for patient to call back and schedule Medicare Annual Wellness Visit (AWV) either virtually or in office.   AWV-I PER PALMETTO 08/18/2020  please schedule at anytime with [facility]  health coach  This should be a 40 minute visit.

## 2021-05-10 ENCOUNTER — Other Ambulatory Visit: Payer: Self-pay | Admitting: Family Medicine

## 2021-05-10 DIAGNOSIS — I10 Essential (primary) hypertension: Secondary | ICD-10-CM

## 2021-05-18 ENCOUNTER — Telehealth: Payer: Self-pay | Admitting: Family Medicine

## 2021-05-18 NOTE — Telephone Encounter (Signed)
Pt is an  Uncontrolled diabetic, most recent attempt to contact him 03/2021. Has not been in office since 05/2020 Needs to make and keep appt. Please help as able  Thanks ( if not answering tele , may need to mail a letter, compliance an issue)

## 2021-05-25 ENCOUNTER — Other Ambulatory Visit: Payer: Self-pay | Admitting: Family Medicine

## 2021-06-06 ENCOUNTER — Other Ambulatory Visit: Payer: Self-pay

## 2021-06-06 ENCOUNTER — Ambulatory Visit (INDEPENDENT_AMBULATORY_CARE_PROVIDER_SITE_OTHER): Payer: Medicare Other | Admitting: Internal Medicine

## 2021-06-06 ENCOUNTER — Encounter: Payer: Self-pay | Admitting: Internal Medicine

## 2021-06-06 ENCOUNTER — Other Ambulatory Visit: Payer: Self-pay | Admitting: *Deleted

## 2021-06-06 VITALS — BP 140/60 | HR 68 | Temp 97.8°F | Resp 18 | Ht 67.0 in | Wt 197.1 lb

## 2021-06-06 DIAGNOSIS — G4733 Obstructive sleep apnea (adult) (pediatric): Secondary | ICD-10-CM | POA: Diagnosis not present

## 2021-06-06 DIAGNOSIS — I10 Essential (primary) hypertension: Secondary | ICD-10-CM | POA: Diagnosis not present

## 2021-06-06 DIAGNOSIS — E782 Mixed hyperlipidemia: Secondary | ICD-10-CM

## 2021-06-06 DIAGNOSIS — E785 Hyperlipidemia, unspecified: Secondary | ICD-10-CM | POA: Diagnosis not present

## 2021-06-06 DIAGNOSIS — Z794 Long term (current) use of insulin: Secondary | ICD-10-CM

## 2021-06-06 DIAGNOSIS — E1165 Type 2 diabetes mellitus with hyperglycemia: Secondary | ICD-10-CM | POA: Diagnosis not present

## 2021-06-06 DIAGNOSIS — G894 Chronic pain syndrome: Secondary | ICD-10-CM

## 2021-06-06 DIAGNOSIS — I739 Peripheral vascular disease, unspecified: Secondary | ICD-10-CM

## 2021-06-06 DIAGNOSIS — R5382 Chronic fatigue, unspecified: Secondary | ICD-10-CM

## 2021-06-06 LAB — POCT GLYCOSYLATED HEMOGLOBIN (HGB A1C): HbA1c, POC (controlled diabetic range): 8 % — AB (ref 0.0–7.0)

## 2021-06-06 MED ORDER — GLIPIZIDE ER 10 MG PO TB24: ORAL_TABLET | ORAL | 1 refills | Status: DC

## 2021-06-06 MED ORDER — BD PEN NEEDLE MINI U/F 31G X 5 MM MISC: 0 refills | Status: DC

## 2021-06-06 MED ORDER — BD PEN NEEDLE MINI U/F 31G X 5 MM MISC: 2 refills | Status: DC

## 2021-06-06 MED ORDER — JANUMET 50-1000 MG PO TABS: 1.0000 | ORAL_TABLET | Freq: Two times a day (BID) | ORAL | 5 refills | Status: DC

## 2021-06-06 NOTE — Assessment & Plan Note (Signed)
Managed by pain clinic, on opioid and Lyrica

## 2021-06-06 NOTE — Assessment & Plan Note (Signed)
On Repatha, compliance questionable Check lipid profile

## 2021-06-06 NOTE — Assessment & Plan Note (Signed)
HbA1C: 8.0 today in office, better than prior Although reports very high glucose levels at home On Lantus 70 U qPM and 20 U qAM, Janumet and Glipizide Concern for compliance, need to review meds in office Advised to follow diabetic diet On statin and ARB F/u CMP and lipid panel Diabetic foot exam: Today Diabetic eye exam: Advised to follow up with Ophthalmology for diabetic eye exam

## 2021-06-06 NOTE — Assessment & Plan Note (Signed)
On Adderall

## 2021-06-06 NOTE — Assessment & Plan Note (Signed)
Needs to be compliant with CPAP use

## 2021-06-06 NOTE — Patient Instructions (Signed)
Please get fasting blood tests before the next visit.  Please make sure to take at least 64 ounces of fluid in a day.  Please continue to follow low carb and low salt diet and bring the blood glucose log in the next visit.

## 2021-06-06 NOTE — Assessment & Plan Note (Signed)
BP Readings from Last 1 Encounters:  06/06/21 140/60   Well-controlled compared to prior visits Counseled for compliance with the medications Advised for proper hydration Advised DASH diet and moderate exercise/walking as tolerated

## 2021-06-06 NOTE — Progress Notes (Signed)
Established Patient Office Visit  Subjective:  Patient ID: Mark Walls, male    DOB: 26-Apr-1956  Age: 65 y.o. MRN: [id number]  CC:  Chief Complaint  Patient presents with   Follow-up    Chronic medical management per dr Lodema Hong has chronic pain issues other than that he is good     HPI Mark Walls presents for follow up of his chronic medical conditions.  HTN: BP is better than prior visits. Compliance is questionable. Denies any headache, chest pain, dyspnea or palpitations.  DM: HbA1C was 8.0 today, better than prior - 9.0. Reports that blood glucose runs more than 200 most of the time. States that he takes Lantus 70 U qPM and 20 U qAM and Janumet. Does not know about Glipizide.  HLD: On Repatha. But compliance is a concern.  Chronic pain: Managed by pain clinic. Has residual pain despite taking opioids.  Chronic fatigue: Well-controlled with Adderall.  Past Medical History:  Diagnosis Date   Anxiety    Chronic fatigue    Depression    Diabetes mellitus, type 2 (HCC)    ED (erectile dysfunction)    Hyperlipidemia    Hypertension    Nicotine addiction    Obesity    PVD (peripheral vascular disease) (HCC)     Past Surgical History:  Procedure Laterality Date   CHOLECYSTECTOMY     COLONOSCOPY  10/01/2002   [name]:small internal hemorrhoids/single small sessile polyp in the rectum/ otherwise normal. Hyperplastic polyp.   COLONOSCOPY WITH ESOPHAGOGASTRODUODENOSCOPY (EGD) N/A 08/19/2013   WUJ:[id number] EG junction. Hiatal hernia. Abnormal gastric mucosa s/p bx: Internal hemorrhoids;  Otherwise, normal ileocolonoscopy - Status post segmental biopsy   ESOPHAGOGASTRODUODENOSCOPY  09/24/2002   [name]:gastritis of the antrum and the bodt of the stomach otherwise normal   left hand     left knee athroscopy  03/2008   polpectomy colon     right common iliac artery stenting     right elbow     ROTATOR CUFF REPAIR  09/01/08    Family History  Problem Relation Age of Onset    Cirrhosis Mother    Melanoma Mother    Mental illness Father        suicide    Mental illness Brother    Diabetes Brother    Hypertension Brother    Diabetes Brother    Hypertension Brother    Mental illness Brother    Melanoma Brother    Colon cancer Paternal Grandfather        age 83    Social History   Socioeconomic History   Marital status: Married    Spouse name: Not on file   Number of children: 2   Years of education: Not on file   Highest education level: Not on file  Occupational History   Occupation: disabled   Tobacco Use   Smoking status: Former    Packs/day: 1.00    Years: 40.00    Pack years: 40.00    Types: Cigarettes    Start date: 09/16/1970    Quit date: 11/17/2013    Years since quitting: 7.5   Smokeless tobacco: Never  Substance and Sexual Activity   Alcohol use: No    Alcohol/week: 0.0 standard drinks   Drug use: No   Sexual activity: Yes    Partners: Female  Other Topics Concern   Not on file  Social History Narrative   Not on file   Social Determinants of Corporate investment banker  Strain: Not on file  Food Insecurity: Not on file  Transportation Needs: Not on file  Physical Activity: Not on file  Stress: Not on file  Social Connections: Not on file  Intimate Partner Violence: Not on file    Outpatient Medications Prior to Visit  Medication Sig Dispense Refill   ACCU-CHEK SOFTCLIX LANCETS lancets Test twice daily 100 each 6   amLODipine (NORVASC) 10 MG tablet TAKE 1 TABLET(10 MG) BY MOUTH DAILY 90 tablet 1   amphetamine-dextroamphetamine (ADDERALL) 15 MG tablet Take 1 tablet by mouth 2 (two) times daily. 60 tablet 0   aspirin 81 MG tablet Take 81 mg by mouth daily.     Azelastine HCl 137 MCG/SPRAY SOLN SPRAY 2 SPRAYS INTO BOTH NOSTRILS TWICE DAILY AS DIRECTED. 30 mL 0   Blood Glucose Monitoring Suppl (ACCU-CHEK AVIVA PLUS) w/Device KIT 1 kit for daily testing dx e11.9 1 kit 0   chlorthalidone (HYGROTON) 25 MG tablet Take one  tablet by mouth once daily 90 tablet 3   clotrimazole-betamethasone (LOTRISONE) cream Apply topically 2 (two) times daily. 45 g 5   Evolocumab (REPATHA SURECLICK) 140 MG/ML SOAJ Inject 140 mg into the skin every 14 (fourteen) days. 2 pen 3   ezetimibe (ZETIA) 10 MG tablet TAKE 1 TABLET(10 MG) BY MOUTH DAILY 90 tablet 1   FLUoxetine (PROZAC) 20 MG capsule TAKE 1 CAPSULE(20 MG) BY MOUTH DAILY 90 capsule 1   fluticasone (FLONASE) 50 MCG/ACT nasal spray 2 SPRAYS INTO BOTH NOSTRILS DAILY. 16 g 5   glucose blood (ACCU-CHEK AVIVA PLUS) test strip USE TO CHECK BLOOD SUGAR FOUR TIMES A DAY. 150 each 3   insulin glargine (LANTUS) 100 UNIT/ML injection Inject 0.3 mLs (30 Units total) into the skin daily after breakfast AND 0.3 mLs (30 Units total) at bedtime. 10 mL 3   LEVEMIR FLEXTOUCH 100 UNIT/ML FlexPen INJECT 50 UNITS UNDER THE SKIN EVERY MORNING AND 20 UNITS EVERY NIGHT AT BEDTIME 21 mL 0   loratadine (CLARITIN) 10 MG tablet Take 1 tablet (10 mg total) by mouth daily. 90 tablet 0   losartan (COZAAR) 100 MG tablet TAKE 1 TABLET ONCE DAILY. 30 tablet 0   metoprolol tartrate (LOPRESSOR) 25 MG tablet TAKE 1/2 TABLET(12.5 MG) BY MOUTH TWICE DAILY 90 tablet 0   naloxone (NARCAN) nasal spray 4 mg/0.1 mL Place 1 spray into the nose once. As needed for up to 1 dose     oxyCODONE-acetaminophen (PERCOCET) 7.5-325 MG tablet Take 1 tablet by mouth every 6 (six) hours as needed for severe pain.     prazosin (MINIPRESS) 1 MG capsule TAKE 1 CAPSULE BY MOUTH AT BEDTIME. 30 capsule 0   spironolactone (ALDACTONE) 25 MG tablet TAKE 1 TABLET(25 MG) BY MOUTH DAILY 90 tablet 1   glipiZIDE (GLUCOTROL XL) 10 MG 24 hr tablet TAKE 2 TABLETS BY MOUTH EVERY MORNING WITH BREAKFAST 180 tablet 1   Insulin Pen Needle (B-D UF III MINI PEN NEEDLES) 31G X 5 MM MISC USE WITH LANTUS DAILY 100 each 0   JANUMET 50-1000 MG tablet TAKE 1 TABLET BY MOUTH TWICE DAILY WITH A MEAL 60 tablet 5   amphetamine-dextroamphetamine (ADDERALL) 15 MG tablet  Take 1 tablet by mouth 2 (two) times daily. 60 tablet 0   No facility-administered medications prior to visit.    Allergies  Allergen Reactions   Allopurinol Nausea And Vomiting   Levofloxacin Nausea And Vomiting   Statins Other (See Comments)    Fatty liver, markedly elevated LFT   Sulfamethoxazole-Trimethoprim  Nausea And Vomiting   Codeine Rash    ROS Review of Systems  Constitutional:  Negative for chills and fever.  HENT:  Negative for congestion and sore throat.   Eyes:  Negative for pain and discharge.  Respiratory:  Negative for cough and shortness of breath.   Cardiovascular:  Negative for chest pain and palpitations.  Gastrointestinal:  Negative for constipation, diarrhea, nausea and vomiting.  Endocrine: Negative for polydipsia and polyuria.  Genitourinary:  Negative for dysuria and hematuria.  Musculoskeletal:  Positive for arthralgias, back pain and myalgias. Negative for neck pain and neck stiffness.  Skin:  Negative for rash.  Neurological:  Negative for dizziness, weakness, numbness and headaches.  Psychiatric/Behavioral:  Negative for agitation and behavioral problems.      Objective:    Physical Exam Vitals reviewed.  Constitutional:      General: He is not in acute distress.    Appearance: He is not diaphoretic.  HENT:     Head: Normocephalic and atraumatic.     Nose: Nose normal.     Mouth/Throat:     Mouth: Mucous membranes are moist.  Eyes:     General: No scleral icterus.    Extraocular Movements: Extraocular movements intact.  Cardiovascular:     Rate and Rhythm: Normal rate and regular rhythm.     Heart sounds: Normal heart sounds. No murmur heard. Pulmonary:     Breath sounds: Normal breath sounds. No wheezing or rales.  Musculoskeletal:     Cervical back: Neck supple. No tenderness.     Right lower leg: No edema.     Left lower leg: No edema.  Skin:    General: Skin is warm.     Findings: No rash.  Neurological:     General: No  focal deficit present.     Mental Status: He is alert and oriented to person, place, and time.     Sensory: No sensory deficit.     Motor: No weakness.  Psychiatric:        Mood and Affect: Mood normal.        Behavior: Behavior normal.    BP 140/60 (BP Location: Left Arm, Cuff Size: Normal)   Pulse 68   Temp 97.8 F (36.6 C) (Oral)   Resp 18   Ht 5\' 7"  (1.702 m)   Wt 197 lb 1.9 oz (89.4 kg)   SpO2 96%   BMI 30.87 kg/m  Wt Readings from Last 3 Encounters:  06/06/21 197 lb 1.9 oz (89.4 kg)  05/25/20 205 lb (93 kg)  04/13/20 209 lb (94.8 kg)     Health Maintenance Due  Topic Date Due   COVID-19 Vaccine (1) Never done   Zoster Vaccines- Shingrix (1 of 2) Never done   OPHTHALMOLOGY EXAM  05/25/2021    There are no preventive care reminders to display for this patient.  Lab Results  Component Value Date   TSH 1.86 06/16/2019   Lab Results  Component Value Date   WBC 8.6 05/25/2020   HGB 15.6 05/25/2020   HCT 46.2 05/25/2020   MCV 91.5 05/25/2020   PLT 232 05/25/2020   Lab Results  Component Value Date   NA 137 05/25/2020   K 4.9 05/25/2020   CO2 29 05/25/2020   GLUCOSE 186 (H) 05/25/2020   BUN 22 05/25/2020   CREATININE 0.86 05/25/2020   BILITOT 0.8 05/25/2020   ALKPHOS 71 07/04/2018   AST 40 (H) 05/25/2020   ALT 98 (H) 05/25/2020  PROT 6.9 05/25/2020   ALBUMIN 3.8 07/04/2018   CALCIUM 10.3 05/25/2020   ANIONGAP 8 07/04/2018   Lab Results  Component Value Date   CHOL 240 (H) 05/25/2020   Lab Results  Component Value Date   HDL 42 05/25/2020   Lab Results  Component Value Date   LDLCALC 160 (H) 05/25/2020   Lab Results  Component Value Date   TRIG 215 (H) 05/25/2020   Lab Results  Component Value Date   CHOLHDL 5.7 (H) 05/25/2020   Lab Results  Component Value Date   HGBA1C 8.0 (A) 06/06/2021      Assessment & Plan:   Problem List Items Addressed This Visit       Cardiovascular and Mediastinum   PAD (peripheral artery disease)  (HCC) (Chronic)    On Aspirin and Repatha       Benign hypertension    BP Readings from Last 1 Encounters:  06/06/21 140/60  Well-controlled compared to prior visits Counseled for compliance with the medications Advised for proper hydration Advised DASH diet and moderate exercise/walking as tolerated       Relevant Orders   CBC     Respiratory   OSA (obstructive sleep apnea)    Needs to be compliant with CPAP use         Endocrine   Type 2 diabetes mellitus with hyperglycemia (HCC) - Primary    HbA1C: 8.0 today in office, better than prior Although reports very high glucose levels at home On Lantus 70 U qPM and 20 U qAM, Janumet and Glipizide Concern for compliance, need to review meds in office Advised to follow diabetic diet On statin and ARB F/u CMP and lipid panel Diabetic foot exam: Today Diabetic eye exam: Advised to follow up with Ophthalmology for diabetic eye exam        Relevant Medications   sitaGLIPtin-metformin (JANUMET) 50-1000 MG tablet   glipiZIDE (GLUCOTROL XL) 10 MG 24 hr tablet   Insulin Pen Needle (B-D UF III MINI PEN NEEDLES) 31G X 5 MM MISC   Other Relevant Orders   POCT glycosylated hemoglobin (Hb A1C) (Completed)   CBC   CMP14+EGFR   HgB A1c   Lipid panel     Other   Hyperlipidemia LDL goal <100 (Chronic)    On Repatha, compliance questionable Check lipid profile       Chronic fatigue    On Adderall       Pain syndrome, chronic    Managed by pain clinic, on opioid and Lyrica       Other Visit Diagnoses     Mixed hyperlipidemia       Relevant Orders   Lipid panel       Meds ordered this encounter  Medications   sitaGLIPtin-metformin (JANUMET) 50-1000 MG tablet    Sig: Take 1 tablet by mouth 2 (two) times daily with a meal.    Dispense:  60 tablet    Refill:  5   glipiZIDE (GLUCOTROL XL) 10 MG 24 hr tablet    Sig: Take 2 tablets by mouth in the morning before breakfast    Dispense:  180 tablet    Refill:  1    Insulin Pen Needle (B-D UF III MINI PEN NEEDLES) 31G X 5 MM MISC    Sig: USE WITH LANTUS DAILY    Dispense:  100 each    Refill:  2    Follow-up: Return in about 3 months (around 09/06/2021) for HTN and DM.  Anabel Halon, MD

## 2021-06-06 NOTE — Assessment & Plan Note (Addendum)
On Aspirin and Repatha

## 2021-06-21 ENCOUNTER — Telehealth: Payer: Self-pay | Admitting: Family Medicine

## 2021-06-21 ENCOUNTER — Other Ambulatory Visit: Payer: Self-pay | Admitting: Family Medicine

## 2021-06-21 ENCOUNTER — Other Ambulatory Visit: Payer: Self-pay

## 2021-06-21 DIAGNOSIS — Z794 Long term (current) use of insulin: Secondary | ICD-10-CM

## 2021-06-21 DIAGNOSIS — R5382 Chronic fatigue, unspecified: Secondary | ICD-10-CM

## 2021-06-21 DIAGNOSIS — E669 Obesity, unspecified: Secondary | ICD-10-CM

## 2021-06-21 DIAGNOSIS — R7989 Other specified abnormal findings of blood chemistry: Secondary | ICD-10-CM

## 2021-06-21 DIAGNOSIS — R972 Elevated prostate specific antigen [PSA]: Secondary | ICD-10-CM

## 2021-06-21 DIAGNOSIS — I739 Peripheral vascular disease, unspecified: Secondary | ICD-10-CM

## 2021-06-21 DIAGNOSIS — E785 Hyperlipidemia, unspecified: Secondary | ICD-10-CM

## 2021-06-21 DIAGNOSIS — I10 Essential (primary) hypertension: Secondary | ICD-10-CM

## 2021-06-21 DIAGNOSIS — E1165 Type 2 diabetes mellitus with hyperglycemia: Secondary | ICD-10-CM

## 2021-06-21 DIAGNOSIS — E559 Vitamin D deficiency, unspecified: Secondary | ICD-10-CM

## 2021-06-21 NOTE — Telephone Encounter (Signed)
Pls let pt know I have sen in script for three daily testing He needs these labs int hte next 1 week, CBC, fasting lipid , cmp and EGFR, pSA, TSH vit D and microalb, please order, / change existing order  ?? Pls ask  Thanks

## 2021-06-21 NOTE — Telephone Encounter (Signed)
Pt informed, lab orders entered.

## 2021-06-28 ENCOUNTER — Other Ambulatory Visit: Payer: Self-pay | Admitting: Family Medicine

## 2021-07-06 ENCOUNTER — Other Ambulatory Visit: Payer: Self-pay

## 2021-07-06 ENCOUNTER — Ambulatory Visit (INDEPENDENT_AMBULATORY_CARE_PROVIDER_SITE_OTHER): Payer: Medicare Other | Admitting: *Deleted

## 2021-07-06 DIAGNOSIS — Z0000 Encounter for general adult medical examination without abnormal findings: Secondary | ICD-10-CM | POA: Diagnosis not present

## 2021-07-06 NOTE — Progress Notes (Signed)
Subjective:   Mark Walls is a 65 y.o. male who presents for Medicare Annual/Subsequent preventive examination.  Review of Systems           Objective:    There were no vitals filed for this visit. There is no height or weight on file to calculate BMI.  Advanced Directives 08/30/2014 08/19/2013  Does Patient Have a Medical Advance Directive? No Patient does not have advance directive;Patient would not like information  Would patient like information on creating a medical advance directive? No - patient declined information -    Current Medications (verified) Outpatient Encounter Medications as of 07/06/2021  Medication Sig   ACCU-CHEK SOFTCLIX LANCETS lancets Test twice daily   amLODipine (NORVASC) 10 MG tablet TAKE 1 TABLET(10 MG) BY MOUTH DAILY   amphetamine-dextroamphetamine (ADDERALL) 15 MG tablet Take 1 tablet by mouth 2 (two) times daily.   amphetamine-dextroamphetamine (ADDERALL) 15 MG tablet Take 1 tablet by mouth 2 (two) times daily.   aspirin 81 MG tablet Take 81 mg by mouth daily.   Azelastine HCl 137 MCG/SPRAY SOLN SPRAY 2 SPRAYS INTO BOTH NOSTRILS TWICE DAILY AS DIRECTED.   Blood Glucose Monitoring Suppl (ACCU-CHEK AVIVA PLUS) w/Device KIT 1 kit for daily testing dx e11.9   chlorthalidone (HYGROTON) 25 MG tablet Take one tablet by mouth once daily   clotrimazole-betamethasone (LOTRISONE) cream Apply topically 2 (two) times daily.   Evolocumab (REPATHA SURECLICK) 468 MG/ML SOAJ Inject 140 mg into the skin every 14 (fourteen) days.   ezetimibe (ZETIA) 10 MG tablet TAKE 1 TABLET(10 MG) BY MOUTH DAILY   FLUoxetine (PROZAC) 20 MG capsule TAKE 1 CAPSULE(20 MG) BY MOUTH DAILY   fluticasone (FLONASE) 50 MCG/ACT nasal spray 2 SPRAYS INTO BOTH NOSTRILS DAILY.   glipiZIDE (GLUCOTROL XL) 10 MG 24 hr tablet Take 2 tablets by mouth in the morning before breakfast   glucose blood (ACCU-CHEK AVIVA PLUS) test strip USE TO CHECK BLOOD SUGAR FOUR TIMES A DAY.   Insulin Pen Needle  (B-D UF III MINI PEN NEEDLES) 31G X 5 MM MISC USE WITH LANTUS DAILY   LEVEMIR FLEXTOUCH 100 UNIT/ML FlexPen INJECT 50 UNITS UNDER THE SKIN EVERY MORNING AND 20 UNITS EVERY NIGHT AT BEDTIME   loratadine (CLARITIN) 10 MG tablet Take 1 tablet (10 mg total) by mouth daily.   losartan (COZAAR) 100 MG tablet TAKE 1 TABLET ONCE DAILY.   metoprolol tartrate (LOPRESSOR) 25 MG tablet TAKE 1/2 TABLET(12.5 MG) BY MOUTH TWICE DAILY   naloxone (NARCAN) nasal spray 4 mg/0.1 mL Place 1 spray into the nose once. As needed for up to 1 dose   oxyCODONE-acetaminophen (PERCOCET) 7.5-325 MG tablet Take 1 tablet by mouth every 6 (six) hours as needed for severe pain.   prazosin (MINIPRESS) 1 MG capsule TAKE 1 CAPSULE BY MOUTH AT BEDTIME.   sitaGLIPtin-metformin (JANUMET) 50-1000 MG tablet Take 1 tablet by mouth 2 (two) times daily with a meal.   spironolactone (ALDACTONE) 25 MG tablet TAKE 1 TABLET(25 MG) BY MOUTH DAILY   No facility-administered encounter medications on file as of 07/06/2021.    Allergies (verified) Allopurinol, Levofloxacin, Statins, Sulfamethoxazole-trimethoprim, and Codeine   History: Past Medical History:  Diagnosis Date   Anxiety    Chronic fatigue    Depression    Diabetes mellitus, type 2 ([place])    ED (erectile dysfunction)    Hyperlipidemia    Hypertension    Nicotine addiction    Obesity    PVD (peripheral vascular disease) ([place])  Past Surgical History:  Procedure Laterality Date   CHOLECYSTECTOMY     COLONOSCOPY  10/01/2002   [name]:small internal hemorrhoids/single small sessile polyp in the rectum/ otherwise normal. Hyperplastic polyp.   COLONOSCOPY WITH ESOPHAGOGASTRODUODENOSCOPY (EGD) N/A 08/19/2013   ULA:GTXMIWOE EG junction. Hiatal hernia. Abnormal gastric mucosa s/p bx: Internal hemorrhoids;  Otherwise, normal ileocolonoscopy - Status post segmental biopsy   ESOPHAGOGASTRODUODENOSCOPY  09/24/2002   [name]:gastritis of the antrum and the bodt of the stomach otherwise  normal   left hand     left knee athroscopy  03/2008   polpectomy colon     right common iliac artery stenting     right elbow     ROTATOR CUFF REPAIR  09/01/08   Family History  Problem Relation Age of Onset   Cirrhosis Mother    Melanoma Mother    Mental illness Father        suicide    Mental illness Brother    Diabetes Brother    Hypertension Brother    Diabetes Brother    Hypertension Brother    Mental illness Brother    Melanoma Brother    Colon cancer Paternal Grandfather        age 55   Social History   Socioeconomic History   Marital status: Married    Spouse name: Not on file   Number of children: 2   Years of education: Not on file   Highest education level: Not on file  Occupational History   Occupation: disabled   Tobacco Use   Smoking status: Former    Packs/day: 1.00    Years: 40.00    Pack years: 40.00    Types: Cigarettes    Start date: 09/16/1970    Quit date: 11/17/2013    Years since quitting: 7.6   Smokeless tobacco: Never  Substance and Sexual Activity   Alcohol use: No    Alcohol/week: 0.0 standard drinks   Drug use: No   Sexual activity: Yes    Partners: Female  Other Topics Concern   Not on file  Social History Narrative   Not on file   Social Determinants of Health   Financial Resource Strain: Not on file  Food Insecurity: Not on file  Transportation Needs: Not on file  Physical Activity: Not on file  Stress: Not on file  Social Connections: Not on file    Tobacco Counseling Counseling given: Not Answered   Clinical Intake:                 Diabetic?yes         Activities of Daily Living In your present state of health, do you have any difficulty performing the following activities: 06/06/2021  Hearing? N  Vision? N  Difficulty concentrating or making decisions? N  Walking or climbing stairs? N  Dressing or bathing? N  Doing errands, shopping? N  Some recent data might be hidden    Patient Care  Team: Fayrene Helper, MD as PCP - [place], Alphonse Guild, MD as PCP - Cardiology (Cardiology)  Indicate any recent Medical Services you may have received from other than [facility] providers in the past year (date may be approximate).     Assessment:   This is a routine wellness examination for [place].  Hearing/Vision screen No results found.  Dietary issues and exercise activities discussed:     Goals Addressed   None   Depression Screen [facility] 2/9 Scores 06/06/2021 03/16/2020 09/30/2019 06/16/2019 02/12/2019 10/30/2018 07/04/2018  PHQ - 2 Score 0 0 _0 PHQ- 9 Score 0 6 16 - 7 - 13    Fall Risk Fall Risk  06/06/2021 11/19/2019 09/30/2019 06/16/2019 02/12/2019  Falls in the past year? 0 1 1 0 0  Number falls in past yr: 0 0 1 - 0  Injury with Fall? 0 0 0 0 0  Risk for fall due to : No Fall Risks - - - History of fall(s);Medication side effect;Other (Comment)  Follow up Falls evaluation completed - - - Falls evaluation completed    FALL RISK PREVENTION PERTAINING TO THE HOME:  Any stairs in or around the home? Yes  If so, are there any without handrails? Yes  Home free of loose throw rugs in walkways, pet beds, electrical cords, etc? Yes  Adequate lighting in your home to reduce risk of falls? Yes   ASSISTIVE DEVICES UTILIZED TO PREVENT FALLS:  Life alert? No  Use of a cane, walker or w/c? Yes  Grab bars in the bathroom? Yes  Shower chair or bench in shower? Yes  Elevated toilet seat or a handicapped toilet? Yes   TIMED UP AND GO:  Was the test performed? No .  Length of time to ambulate 10 feet: NA sec.    Cognitive Function:        Immunizations Immunization History  Administered Date(s) Administered   Influenza Split 09/03/2012   Influenza Whole 09/16/2008, 10/29/2009   Influenza,inj,Quad PF,6+ Mos 09/18/2013, 10/28/2014, 12/14/2015, 09/05/2016, 10/17/2017, 10/30/2018, 09/30/2019   Pneumococcal Conjugate-13 01/27/2015   Pneumococcal Polysaccharide-23  10/29/2009, 09/30/2019   Tdap 09/05/2016    TDAP status: Up to date  Flu Vaccine status: Up to date  Pneumococcal vaccine status: Up to date  Covid-19 vaccine status: Completed vaccines  Qualifies for Shingles Vaccine? Yes   Zostavax completed No   Shingrix Completed?: No.    Education has been provided regarding the importance of this vaccine. Patient has been advised to call insurance company to determine out of pocket expense if they have not yet received this vaccine. Advised may also receive vaccine at local pharmacy or Health Dept. Verbalized acceptance and understanding.  Screening Tests Health Maintenance  Topic Date Due   COVID-19 Vaccine (1) Never done   Zoster Vaccines- Shingrix (1 of 2) Never done   OPHTHALMOLOGY EXAM  05/25/2021   INFLUENZA VACCINE  07/18/2021   HEMOGLOBIN A1C  12/06/2021   FOOT EXAM  06/06/2022   COLONOSCOPY (Pts 45-18yr Insurance coverage will need to be confirmed)  08/20/2023   TETANUS/TDAP  09/05/2026   Hepatitis C Screening  Completed   PNA vac Low Risk Adult  Completed   HPV VACCINES  Aged Out    Health Maintenance  Health Maintenance Due  Topic Date Due   COVID-19 Vaccine (1) Never done   Zoster Vaccines- Shingrix (1 of 2) Never done   OPHTHALMOLOGY EXAM  05/25/2021    Colorectal cancer screening: Type of screening: Colonoscopy. Completed 08-19-13. Repeat every 10 years  Lung Cancer Screening: (Low Dose CT Chest recommended if Age 67-80years, 30 pack-year currently smoking OR have quit w/in 15years.) does qualify.   Lung Cancer Screening Referral: yes  Additional Screening:  Hepatitis C Screening: does qualify; Completed 01-20-14  Vision Screening: Recommended annual ophthalmology exams for early detection of glaucoma and other disorders of the eye. Is the patient up to date with their annual eye exam?  No  Who is the provider or what is the name of  the office in which the patient attends annual eye exams? Will schedule in our  office  If pt is not established with a provider, would they like to be referred to a provider to establish care? No .   Dental Screening: Recommended annual dental exams for proper oral hygiene  Community Resource Referral / Chronic Care Management: CRR required this visit?  No   CCM required this visit?  No      Plan:     I have personally reviewed and noted the following in the patient's chart:   Medical and social history Use of alcohol, tobacco or illicit drugs  Current medications and supplements including opioid prescriptions. Patient is currently taking opioid prescriptions. Information provided to patient regarding non-opioid alternatives. Patient advised to discuss non-opioid treatment plan with their provider. Functional ability and status Nutritional status Physical activity Advanced directives List of other physicians Hospitalizations, surgeries, and ER visits in previous 12 months Vitals Screenings to include cognitive, depression, and falls Referrals and appointments  In addition, I have reviewed and discussed with patient certain preventive protocols, quality metrics, and best practice recommendations. A written personalized care plan for preventive services as well as general preventive health recommendations were provided to patient.     Shelda Altes, CMA   07/06/2021   Nurse Notes:

## 2021-07-06 NOTE — Patient Instructions (Signed)
Mark Walls , Thank you for taking time to come for your Medicare Wellness Visit. I appreciate your ongoing commitment to your health goals. Please review the following plan we discussed and let me know if I can assist you in the future.   Screening recommendations/referrals: Colonoscopy: Due 08-20-23 Recommended yearly ophthalmology/optometry visit for glaucoma screening and checkup Recommended yearly dental visit for hygiene and checkup  Vaccinations: Influenza vaccine: Due 07-18-21 Pneumococcal vaccine: Completed Tdap vaccine: Due 09-05-26 Shingles vaccine: Due now     Advanced directives: Patient declined information  Conditions/risks identified: Hypertension, diabetes  Next appointment: 1 year   Preventive Care 67 Years and Older, Male Preventive care refers to lifestyle choices and visits with your health care provider that can promote health and wellness. What does preventive care include? A yearly physical exam. This is also called an annual well check. Dental exams once or twice a year. Routine eye exams. Ask your health care provider how often you should have your eyes checked. Personal lifestyle choices, including: Daily care of your teeth and gums. Regular physical activity. Eating a healthy diet. Avoiding tobacco and drug use. Limiting alcohol use. Practicing safe sex. Taking low doses of aspirin every day. Taking vitamin and mineral supplements as recommended by your health care provider. What happens during an annual well check? The services and screenings done by your health care provider during your annual well check will depend on your age, overall health, lifestyle risk factors, and family history of disease. Counseling  Your health care provider may ask you questions about your: Alcohol use. Tobacco use. Drug use. Emotional well-being. Home and relationship well-being. Sexual activity. Eating habits. History of falls. Memory and ability to understand  (cognition). Work and work Statistician. Screening  You may have the following tests or measurements: Height, weight, and BMI. Blood pressure. Lipid and cholesterol levels. These may be checked every 5 years, or more frequently if you are over 40 years old. Skin check. Lung cancer screening. You may have this screening every year starting at age 81 if you have a 30-pack-year history of smoking and currently smoke or have quit within the past 15 years. Fecal occult blood test (FOBT) of the stool. You may have this test every year starting at age 32. Flexible sigmoidoscopy or colonoscopy. You may have a sigmoidoscopy every 5 years or a colonoscopy every 10 years starting at age 22. Prostate cancer screening. Recommendations will vary depending on your family history and other risks. Hepatitis C blood test. Hepatitis B blood test. Sexually transmitted disease (STD) testing. Diabetes screening. This is done by checking your blood sugar (glucose) after you have not eaten for a while (fasting). You may have this done every 1-3 years. Abdominal aortic aneurysm (AAA) screening. You may need this if you are a current or former smoker. Osteoporosis. You may be screened starting at age 47 if you are at high risk. Talk with your health care provider about your test results, treatment options, and if necessary, the need for more tests. Vaccines  Your health care provider may recommend certain vaccines, such as: Influenza vaccine. This is recommended every year. Tetanus, diphtheria, and acellular pertussis (Tdap, Td) vaccine. You may need a Td booster every 10 years. Zoster vaccine. You may need this after age 86. Pneumococcal 13-valent conjugate (PCV13) vaccine. One dose is recommended after age 22. Pneumococcal polysaccharide (PPSV23) vaccine. One dose is recommended after age 30. Talk to your health care provider about which screenings and vaccines you need and  how often you need them. This  information is not intended to replace advice given to you by your health care provider. Make sure you discuss any questions you have with your health care provider. Document Released: 12/31/2015 Document Revised: 08/23/2016 Document Reviewed: 10/05/2015 Elsevier Interactive Patient Education  2017 [employer] Prevention in the Home Falls can cause injuries. They can happen to people of all ages. There are many things you can do to make your home safe and to help prevent falls. What can I do on the outside of my home? Regularly fix the edges of walkways and driveways and fix any cracks. Remove anything that might make you trip as you walk through a door, such as a raised step or threshold. Trim any bushes or trees on the path to your home. Use bright outdoor lighting. Clear any walking paths of anything that might make someone trip, such as rocks or tools. Regularly check to see if handrails are loose or broken. Make sure that both sides of any steps have handrails. Any raised decks and porches should have guardrails on the edges. Have any leaves, snow, or ice cleared regularly. Use sand or salt on walking paths during winter. Clean up any spills in your garage right away. This includes oil or grease spills. What can I do in the bathroom? Use night lights. Install grab bars by the toilet and in the tub and shower. Do not use towel bars as grab bars. Use non-skid mats or decals in the tub or shower. If you need to sit down in the shower, use a plastic, non-slip stool. Keep the floor dry. Clean up any water that spills on the floor as soon as it happens. Remove soap buildup in the tub or shower regularly. Attach bath mats securely with double-sided non-slip rug tape. Do not have throw rugs and other things on the floor that can make you trip. What can I do in the bedroom? Use night lights. Make sure that you have a light by your bed that is easy to reach. Do not use any sheets or  blankets that are too big for your bed. They should not hang down onto the floor. Have a firm chair that has side arms. You can use this for support while you get dressed. Do not have throw rugs and other things on the floor that can make you trip. What can I do in the kitchen? Clean up any spills right away. Avoid walking on wet floors. Keep items that you use a lot in easy-to-reach places. If you need to reach something above you, use a strong step stool that has a grab bar. Keep electrical cords out of the way. Do not use floor polish or wax that makes floors slippery. If you must use wax, use non-skid floor wax. Do not have throw rugs and other things on the floor that can make you trip. What can I do with my stairs? Do not leave any items on the stairs. Make sure that there are handrails on both sides of the stairs and use them. Fix handrails that are broken or loose. Make sure that handrails are as long as the stairways. Check any carpeting to make sure that it is firmly attached to the stairs. Fix any carpet that is loose or worn. Avoid having throw rugs at the top or bottom of the stairs. If you do have throw rugs, attach them to the floor with carpet tape. Make sure that you have  a light switch at the top of the stairs and the bottom of the stairs. If you do not have them, ask someone to add them for you. What else can I do to help prevent falls? Wear shoes that: Do not have high heels. Have rubber bottoms. Are comfortable and fit you well. Are closed at the toe. Do not wear sandals. If you use a stepladder: Make sure that it is fully opened. Do not climb a closed stepladder. Make sure that both sides of the stepladder are locked into place. Ask someone to hold it for you, if possible. Clearly mark and make sure that you can see: Any grab bars or handrails. First and last steps. Where the edge of each step is. Use tools that help you move around (mobility aids) if they are  needed. These include: Canes. Walkers. Scooters. Crutches. Turn on the lights when you go into a dark area. Replace any light bulbs as soon as they burn out. Set up your furniture so you have a clear path. Avoid moving your furniture around. If any of your floors are uneven, fix them. If there are any pets around you, be aware of where they are. Review your medicines with your doctor. Some medicines can make you feel dizzy. This can increase your chance of falling. Ask your doctor what other things that you can do to help prevent falls. This information is not intended to replace advice given to you by your health care provider. Make sure you discuss any questions you have with your health care provider. Document Released: 09/30/2009 Document Revised: 05/11/2016 Document Reviewed: 01/08/2015 Elsevier Interactive Patient Education  2017 [employer].

## 2021-07-17 ENCOUNTER — Other Ambulatory Visit: Payer: Self-pay | Admitting: Family Medicine

## 2021-07-21 NOTE — Progress Notes (Signed)
I connected with  Mark Walls on 07/21/21 by an audio enabled telemedicine application and verified that I am speaking with the correct person using two identifiers.   I discussed the limitations, risks, security and privacy concerns of performing an evaluation and management service by telephone and the availability of in person appointments. I also discussed with the patient that there may be a patient responsible charge related to this service. The patient expressed understanding and verbally consented to this telephonic visit.  Patient was at home for visit. Provider was Tula Nakayama MD who was in office. This was an audio telehealth visit.

## 2021-07-23 ENCOUNTER — Other Ambulatory Visit: Payer: Self-pay | Admitting: Family Medicine

## 2021-07-23 DIAGNOSIS — I10 Essential (primary) hypertension: Secondary | ICD-10-CM

## 2021-08-11 ENCOUNTER — Ambulatory Visit: Payer: Medicare Other

## 2021-08-16 ENCOUNTER — Other Ambulatory Visit: Payer: Self-pay | Admitting: Family Medicine

## 2021-09-06 ENCOUNTER — Ambulatory Visit: Payer: Medicare Other | Admitting: Family Medicine

## 2021-09-21 ENCOUNTER — Other Ambulatory Visit: Payer: Self-pay | Admitting: Family Medicine

## 2021-09-21 DIAGNOSIS — Z794 Long term (current) use of insulin: Secondary | ICD-10-CM

## 2021-09-26 ENCOUNTER — Other Ambulatory Visit: Payer: Self-pay | Admitting: Family Medicine

## 2022-02-07 ENCOUNTER — Ambulatory Visit: Payer: Medicare Other | Admitting: Family Medicine

## 2022-03-03 ENCOUNTER — Other Ambulatory Visit: Payer: Self-pay

## 2022-03-03 ENCOUNTER — Encounter: Payer: Self-pay | Admitting: Family Medicine

## 2022-03-03 ENCOUNTER — Ambulatory Visit (INDEPENDENT_AMBULATORY_CARE_PROVIDER_SITE_OTHER): Payer: Medicare Other | Admitting: Family Medicine

## 2022-03-03 VITALS — BP 164/62 | HR 76 | Resp 18 | Ht 67.0 in | Wt 196.0 lb

## 2022-03-03 DIAGNOSIS — Z125 Encounter for screening for malignant neoplasm of prostate: Secondary | ICD-10-CM | POA: Diagnosis not present

## 2022-03-03 DIAGNOSIS — G894 Chronic pain syndrome: Secondary | ICD-10-CM

## 2022-03-03 DIAGNOSIS — E785 Hyperlipidemia, unspecified: Secondary | ICD-10-CM

## 2022-03-03 DIAGNOSIS — Z794 Long term (current) use of insulin: Secondary | ICD-10-CM | POA: Diagnosis not present

## 2022-03-03 DIAGNOSIS — E559 Vitamin D deficiency, unspecified: Secondary | ICD-10-CM

## 2022-03-03 DIAGNOSIS — E1165 Type 2 diabetes mellitus with hyperglycemia: Secondary | ICD-10-CM | POA: Diagnosis not present

## 2022-03-03 DIAGNOSIS — E669 Obesity, unspecified: Secondary | ICD-10-CM

## 2022-03-03 LAB — POCT GLYCOSYLATED HEMOGLOBIN (HGB A1C): HbA1c, POC (controlled diabetic range): 7.7 % — AB (ref 0.0–7.0)

## 2022-03-03 MED ORDER — LEVEMIR FLEXTOUCH 100 UNIT/ML ~~LOC~~ SOPN: PEN_INJECTOR | SUBCUTANEOUS | 11 refills | Status: DC

## 2022-03-03 MED ORDER — AMLODIPINE BESYLATE 10 MG PO TABS: 10.0000 mg | ORAL_TABLET | Freq: Every day | ORAL | 3 refills | Status: DC

## 2022-03-03 MED ORDER — JANUMET 50-1000 MG PO TABS: 1.0000 | ORAL_TABLET | Freq: Two times a day (BID) | ORAL | 5 refills | Status: DC

## 2022-03-03 MED ORDER — OLMESARTAN MEDOXOMIL 20 MG PO TABS: 20.0000 mg | ORAL_TABLET | Freq: Every day | ORAL | 3 refills | Status: DC

## 2022-03-03 NOTE — Patient Instructions (Addendum)
F/u in 3 weeks WITH ALL MEDICATION bottles that you are taking ? ?New bP med , olmesartan 20 mg daily, and continue amlodipine 10 mg dAILY ? ?INCREASE LEVMIR TO 50 UNITS TWICE DAILY ? ?LABS TODAY, MICROALB, uds, Cbc, LIPID CMP AND egr, tsh, PSa AND VIT d ? ?Thanks for choosing [facility], we consider it a privelige to serve you. ? ?

## 2022-03-05 ENCOUNTER — Encounter: Payer: Self-pay | Admitting: Family Medicine

## 2022-03-05 LAB — CMP14+EGFR
ALT: 40 IU/L (ref 0–44)
AST: 25 IU/L (ref 0–40)
Albumin/Globulin Ratio: 1.6 (ref 1.2–2.2)
Albumin: 4.5 g/dL (ref 3.8–4.8)
Alkaline Phosphatase: 95 IU/L (ref 44–121)
BUN/Creatinine Ratio: 20 (ref 10–24)
BUN: 20 mg/dL (ref 8–27)
Bilirubin Total: 0.5 mg/dL (ref 0.0–1.2)
CO2: 24 mmol/L (ref 20–29)
Calcium: 10.8 mg/dL — ABNORMAL HIGH (ref 8.6–10.2)
Chloride: 104 mmol/L (ref 96–106)
Creatinine, Ser: 1 mg/dL (ref 0.76–1.27)
Globulin, Total: 2.9 g/dL (ref 1.5–4.5)
Glucose: 127 mg/dL — ABNORMAL HIGH (ref 70–99)
Potassium: 5.2 mmol/L (ref 3.5–5.2)
Sodium: 141 mmol/L (ref 134–144)
Total Protein: 7.4 g/dL (ref 6.0–8.5)
eGFR: 82 mL/min/{1.73_m2} (ref >59)

## 2022-03-05 LAB — LIPID PANEL
Chol/HDL Ratio: 7.1 ratio — ABNORMAL HIGH (ref 0.0–5.0)
Cholesterol, Total: 247 mg/dL — ABNORMAL HIGH (ref 100–199)
HDL: 35 mg/dL — ABNORMAL LOW
LDL Chol Calc (NIH): 177 mg/dL — ABNORMAL HIGH (ref 0–99)
Triglycerides: 188 mg/dL — ABNORMAL HIGH (ref 0–149)
VLDL Cholesterol Cal: 35 mg/dL (ref 5–40)

## 2022-03-05 LAB — MICROALBUMIN / CREATININE URINE RATIO
Creatinine, Urine: 330.5 mg/dL
Microalb/Creat Ratio: 26 mg/g creat (ref 0–29)
Microalbumin, Urine: 87.3 ug/mL

## 2022-03-05 LAB — CBC
Hematocrit: 45.7 % (ref 37.5–51.0)
Hemoglobin: 15.6 g/dL (ref 13.0–17.7)
MCH: 30.8 pg (ref 26.6–33.0)
MCHC: 34.1 g/dL (ref 31.5–35.7)
MCV: 90 fL (ref 79–97)
Platelets: 239 x10E3/uL (ref 150–450)
RBC: 5.07 x10E6/uL (ref 4.14–5.80)
RDW: 12.7 % (ref 11.6–15.4)
WBC: 11 x10E3/uL — ABNORMAL HIGH (ref 3.4–10.8)

## 2022-03-05 LAB — VITAMIN D 25 HYDROXY (VIT D DEFICIENCY, FRACTURES): Vit D, 25-Hydroxy: 22.5 ng/mL — ABNORMAL LOW (ref 30.0–100.0)

## 2022-03-05 LAB — TSH: TSH: 1.67 u[IU]/mL (ref 0.450–4.500)

## 2022-03-05 LAB — PSA: Prostate Specific Ag, Serum: 1.6 ng/mL (ref 0.0–4.0)

## 2022-03-05 MED ORDER — FENOFIBRATE 48 MG PO TABS: 48.0000 mg | ORAL_TABLET | Freq: Every day | ORAL | 5 refills | Status: DC

## 2022-03-05 MED ORDER — REPATHA SURECLICK 140 MG/ML ~~LOC~~ SOAJ: 140.0000 mg | SUBCUTANEOUS | 5 refills | Status: AC

## 2022-03-05 MED ORDER — ERGOCALCIFEROL 1.25 MG (50000 UT) PO CAPS: 50000.0000 [IU] | ORAL_CAPSULE | ORAL | 1 refills | Status: DC

## 2022-03-05 NOTE — Assessment & Plan Note (Signed)
needs to establish with pain clinic for management of chronic pain, reports recently d/c from clinic he has been at for approx 2 years because of missed appt.  ?

## 2022-03-05 NOTE — Progress Notes (Signed)
? ?IKEEM SCHOENIG     MRN: [id number]      DOB: May 17, 1956 ? ? ?HPI ?Mr. Shotts is here for follow up and re-evaluation of chronic medical conditions, medication management and review of any available recent lab and radiology data.  ?Preventive health is updated, specifically  Cancer screening and Immunization.   ?Questions or concerns regarding consultations or procedures which the PT has had in the interim are  addressed. ?The PT denies any adverse reactions to current medications since the last visit.  ?C/o needing pain meds as he has been d/c from pain clinic as he missed appointment, he will need to establish with new pain clinic  ?Not taking most meds listed and does not know what he is taking other than insulin ?Denies polyuria, polydipsia, blurred vision , or hypoglycemic episodes. ? ? ?ROS ?Denies recent fever or chills. ?Denies sinus pressure, nasal congestion, ear pain or sore throat. ?Denies chest congestion, productive cough or wheezing. ?Denies chest pains, palpitations and leg swelling ?Denies abdominal pain, nausea, vomiting,diarrhea or constipation.   ?Denies dysuria, frequency, hesitancy or incontinence. ?Chronic  joint pain,  and limitation in mobility. ?Denies headaches, seizures, numbness, or tingling. ?Denies depression, anxiety or insomnia. ?Denies skin break down or rash. ? ? ?PE ? ?BP (!) 164/62   Pulse 76   Resp 18   Ht 5\' 7"  (1.702 m)   Wt 196 lb 0.6 oz (88.9 kg)   SpO2 96%   BMI 30.70 kg/m?  ? ?Patient alert and oriented and in no cardiopulmonary distress. ? ?HEENT: No facial asymmetry, EOMI,     Neck supple . ? ?Chest: Clear to auscultation bilaterally. ? ?CVS: S1, S2 no murmurs, no S3.Regular rate. ? ?ABD: Soft non tender.  ? ?Ext: No edema ? ?MS: Adequate though reduced  ROM spine,  normal in shoulders, hips and knees. ? ?Skin: Intact, no ulcerations or rash noted. ? ?Psych: Good eye contact, normal affect. Memory intact not anxious or depressed appearing. ? ?CNS: CN 2-12  intact, power,  normal throughout.no focal deficits noted. ? ? ?Assessment & Plan ? ?Hyperlipidemia LDL goal <100 ?Hyperlipidemia:Low fat diet discussed and encouraged. ? ? ?Lipid Panel  ?Lab Results  ?Component Value Date  ? CHOL 247 (H) 03/03/2022  ? HDL 35 (L) 03/03/2022  ? LDLCALC 177 (H) 03/03/2022  ? TRIG 188 (H) 03/03/2022  ? CHOLHDL 7.1 (H) 03/03/2022  ? ? ? ?Start repatha and fenofibrate ? ?Type 2 diabetes mellitus with hyperglycemia (HCC) ?Mr. Torrez is reminded of the importance of commitment to daily physical activity for 30 minutes or more, as able and the need to limit carbohydrate intake to 30 to 60 grams per meal to help with blood sugar control.  ? ?The need to take medication as prescribed, test blood sugar as directed, and to call between visits if there is a concern that blood sugar is uncontrolled is also discussed.  ? ?Mr. Petra is reminded of the importance of daily foot exam, annual eye examination, and good blood sugar, blood pressure and cholesterol control. ? ?Diabetic Labs Latest Ref Rng & Units 03/03/2022 06/06/2021 05/25/2020 01/21/2020 10/08/2019  ?HbA1c 0.0 - 7.0 % 7.7(A) 8.0(A) 9.0(H) 8.4(H) 8.9(H)  ?Microalbumin mg/dL - - - - 2.8  ?Micro/Creat Ratio 0 - 29 mg/g creat 26 - - - 13  ?Chol 100 - 199 mg/dL 782(N) - 562(Z) 308(M) 250(H)  ?HDL >39 mg/dL 57(Q) - 42 44 45  ?Calc LDL 0 - 99 mg/dL 469(G) - 295(M) 841(L) 176(H)  ?  Triglycerides 0 - 149 mg/dL 161(W) - 960(A) 540(J) 149  ?Creatinine 0.76 - 1.27 mg/dL 8.11 - 9.14 7.82 9.56  ? ?BP/Weight 03/03/2022 06/06/2021 05/25/2020 04/13/2020 03/16/2020 01/21/2020 11/19/2019  ?Systolic BP 164 140 140 162 160 150 148  ?Diastolic BP 62 60 72 80 70 78 78  ?Wt. (Lbs) 196.04 197.12 205 209 212 209.4 211  ?BMI 30.7 30.87 32.11 32.73 33.2 32.8 33.05  ? ?Foot/eye exam completion dates Latest Ref Rng & Units 06/06/2021 05/25/2020  ?Eye Exam No Retinopathy - -  ?Foot Form Completion - Done Done  ? ?Uncontrolled , increase levemir to 100 units daily (  total) ? ? ? ? ?Obesity (BMI 30.0-34.9) ? ?Patient re-educated about  the importance of commitment to a  minimum of 150 minutes of exercise per week as able. ? ?The importance of healthy food choices with portion control discussed, as well as eating regularly and within a 12 hour window most days. ?The need to choose "clean , green" food 50 to 75% of the time is discussed, as well as to make water the primary drink and set a goal of 64 ounces water daily. ? ?  ?Weight /BMI 03/03/2022 06/06/2021 05/25/2020  ?WEIGHT 196 lb 0.6 oz 197 lb 1.9 oz 205 lb  ?HEIGHT 5\' 7"  5\' 7"  5\' 7"   ?BMI 30.7 kg/m2 30.87 kg/m2 32.11 kg/m2  ? ? ? ? ?Pain syndrome, chronic ?needs to establish with pain clinic for management of chronic pain, reports recently d/c from clinic he has been at for approx 2 years because of missed appt.  ? ?

## 2022-03-05 NOTE — Assessment & Plan Note (Signed)
?  Patient re-educated about  the importance of commitment to a  minimum of 150 minutes of exercise per week as able. ? ?The importance of healthy food choices with portion control discussed, as well as eating regularly and within a 12 hour window most days. ?The need to choose "clean , green" food 50 to 75% of the time is discussed, as well as to make water the primary drink and set a goal of 64 ounces water daily. ? ?  ?Weight /BMI 03/03/2022 06/06/2021 05/25/2020  ?WEIGHT 196 lb 0.6 oz 197 lb 1.9 oz 205 lb  ?HEIGHT '5\' 7"'$  '5\' 7"'$  '5\' 7"'$   ?BMI 30.7 kg/m2 30.87 kg/m2 32.11 kg/m2  ? ? ? ?

## 2022-03-05 NOTE — Assessment & Plan Note (Signed)
Hyperlipidemia:Low fat diet discussed and encouraged. ? ? ?Lipid Panel  ?Lab Results  ?Component Value Date  ? CHOL 247 (H) 03/03/2022  ? HDL 35 (L) 03/03/2022  ? LDLCALC 177 (H) 03/03/2022  ? TRIG 188 (H) 03/03/2022  ? CHOLHDL 7.1 (H) 03/03/2022  ? ? ? ?Start repatha and fenofibrate ?

## 2022-03-05 NOTE — Assessment & Plan Note (Signed)
Mark Walls is reminded of the importance of commitment to daily physical activity for 30 minutes or more, as able and the need to limit carbohydrate intake to 30 to 60 grams per meal to help with blood sugar control.  ? ?The need to take medication as prescribed, test blood sugar as directed, and to call between visits if there is a concern that blood sugar is uncontrolled is also discussed.  ? ?Mark Walls is reminded of the importance of daily foot exam, annual eye examination, and good blood sugar, blood pressure and cholesterol control. ? ?Diabetic Labs Latest Ref Rng & Units 03/03/2022 06/06/2021 05/25/2020 01/21/2020 10/08/2019  ?HbA1c 0.0 - 7.0 % 7.7(A) 8.0(A) 9.0(H) 8.4(H) 8.9(H)  ?Microalbumin mg/dL - - - - 2.8  ?Micro/Creat Ratio 0 - 29 mg/g creat 26 - - - 13  ?Chol 100 - 199 mg/dL 247(H) - 240(H) 244(H) 250(H)  ?HDL >39 mg/dL 35(L) - 42 44 45  ?Calc LDL 0 - 99 mg/dL 177(H) - 160(H) 161(H) 176(H)  ?Triglycerides 0 - 149 mg/dL 188(H) - 215(H) 218(H) 149  ?Creatinine 0.76 - 1.27 mg/dL 1.00 - 0.86 0.89 0.91  ? ?BP/Weight 03/03/2022 06/06/2021 05/25/2020 04/13/2020 03/16/2020 01/21/2020 11/19/2019  ?Systolic BP 972 820 601 561 160 150 148  ?Diastolic BP 62 60 72 80 70 78 78  ?Wt. (Lbs) 196.04 197.12 205 209 212 209.4 211  ?BMI 30.7 30.87 32.11 32.73 33.2 32.8 33.05  ? ?Foot/eye exam completion dates Latest Ref Rng & Units 06/06/2021 05/25/2020  ?Eye Exam No Retinopathy - -  ?Foot Form Completion - Done Done  ? ?Uncontrolled , increase levemir to 100 units daily ( total) ? ? ? ?

## 2022-03-07 ENCOUNTER — Telehealth: Payer: Self-pay

## 2022-03-07 NOTE — Telephone Encounter (Signed)
Patient returning lab results call 

## 2022-03-08 ENCOUNTER — Telehealth: Payer: Self-pay | Admitting: Family Medicine

## 2022-03-08 NOTE — Telephone Encounter (Signed)
LMTRC

## 2022-03-08 NOTE — Telephone Encounter (Signed)
Patient called in for refill on  ?oxyCODONE-acetaminophen (PERCOCET) 7.5-325 MG tablet  ? ?[facility] on [address]  ?

## 2022-03-08 NOTE — Telephone Encounter (Signed)
Patient aware of lab results.

## 2022-03-08 NOTE — Telephone Encounter (Signed)
Patient aware.

## 2022-03-10 ENCOUNTER — Other Ambulatory Visit: Payer: Self-pay | Admitting: Family Medicine

## 2022-03-10 DIAGNOSIS — Z794 Long term (current) use of insulin: Secondary | ICD-10-CM

## 2022-03-10 LAB — TOXASSURE SELECT 13 (MW), URINE

## 2022-03-10 NOTE — Progress Notes (Signed)
Called pt no answer °

## 2022-03-22 ENCOUNTER — Telehealth: Payer: Self-pay | Admitting: Family Medicine

## 2022-03-22 NOTE — Telephone Encounter (Signed)
Please advise 

## 2022-03-22 NOTE — Telephone Encounter (Signed)
Patient called in regard to pain management. ? ?Patient states that appt with clinic is not until 5/2 ?Clinic suggest that primary care provide pain meds until patient can be established in office with them. ? ?Patient would like a call back in regard  ? ? ? ? ? ?

## 2022-03-28 ENCOUNTER — Other Ambulatory Visit: Payer: Self-pay

## 2022-03-28 MED ORDER — GABAPENTIN 300 MG PO CAPS: 300.0000 mg | ORAL_CAPSULE | Freq: Every day | ORAL | 1 refills | Status: DC

## 2022-04-13 ENCOUNTER — Ambulatory Visit: Payer: Medicare Other | Admitting: Family Medicine

## 2022-05-26 ENCOUNTER — Other Ambulatory Visit: Payer: Self-pay

## 2022-05-26 ENCOUNTER — Telehealth: Payer: Self-pay | Admitting: Family Medicine

## 2022-05-26 MED ORDER — ACCU-CHEK AVIVA PLUS VI STRP: ORAL_STRIP | 4 refills | Status: DC

## 2022-05-26 NOTE — Telephone Encounter (Signed)
Pls let pt know I am unable to prescribe  diabetic test supplies , and his bP meds, I am concerned about both needs too keep appt in office with his meds next 1 to 2 weeks, please. I have left a request from pharmacy in your area

## 2022-06-02 NOTE — Telephone Encounter (Signed)
LVM for pt to call the office.

## 2022-06-07 NOTE — Telephone Encounter (Signed)
LMTRC

## 2022-06-08 ENCOUNTER — Other Ambulatory Visit: Payer: Self-pay | Admitting: Family Medicine

## 2022-06-08 ENCOUNTER — Telehealth: Payer: Self-pay | Admitting: Family Medicine

## 2022-06-08 NOTE — Telephone Encounter (Signed)
[facility] called in regard to  insulin detemir (LEVEMIR FLEXTOUCH) 100 UNIT/ML FlexPen  Has been changed to flexpen , patient needs new script   Call back info [phone]

## 2022-06-09 NOTE — Telephone Encounter (Signed)
Called patient LVM to call office and schedule appt.

## 2022-06-14 NOTE — Telephone Encounter (Signed)
Left voice mail to call office to schedule an in office appointment.

## 2022-06-14 NOTE — Telephone Encounter (Signed)
Needs in office appt in the next 2 weeks. We have tried to contact him but no answer

## 2022-06-28 ENCOUNTER — Ambulatory Visit: Payer: Medicare Other | Admitting: Family Medicine

## 2022-06-28 ENCOUNTER — Encounter: Payer: Self-pay | Admitting: Family Medicine

## 2022-07-01 ENCOUNTER — Other Ambulatory Visit: Payer: Self-pay | Admitting: Internal Medicine

## 2022-07-01 DIAGNOSIS — E1165 Type 2 diabetes mellitus with hyperglycemia: Secondary | ICD-10-CM

## 2022-07-07 ENCOUNTER — Ambulatory Visit: Payer: Medicare Other

## 2022-07-14 ENCOUNTER — Telehealth: Payer: Self-pay

## 2022-07-14 ENCOUNTER — Other Ambulatory Visit: Payer: Self-pay

## 2022-07-14 DIAGNOSIS — E1165 Type 2 diabetes mellitus with hyperglycemia: Secondary | ICD-10-CM

## 2022-07-14 MED ORDER — LEVEMIR FLEXTOUCH 100 UNIT/ML ~~LOC~~ SOPN: PEN_INJECTOR | SUBCUTANEOUS | 0 refills | Status: DC

## 2022-07-14 MED ORDER — BD PEN NEEDLE MINI U/F 31G X 5 MM MISC: 0 refills | Status: DC

## 2022-07-14 NOTE — Telephone Encounter (Signed)
Med sent.

## 2022-07-14 NOTE — Telephone Encounter (Signed)
Wife called needs med refill insulin.  Has not received a new PCP yet and completely out of his insulin.    insulin detemir (LEVEMIR FLEXTOUCH) 100 UNIT/ML FlexPen   Insulin Pen Needle (B-D UF III MINI PEN NEEDLES) 31G X 5 MM MISC   Pharmacy: [place]

## 2022-07-25 ENCOUNTER — Other Ambulatory Visit: Payer: Self-pay | Admitting: Family Medicine

## 2022-08-19 ENCOUNTER — Other Ambulatory Visit: Payer: Self-pay | Admitting: Family Medicine

## 2023-01-31 ENCOUNTER — Ambulatory Visit (INDEPENDENT_AMBULATORY_CARE_PROVIDER_SITE_OTHER): Payer: Medicare Other | Admitting: Family Medicine

## 2023-01-31 ENCOUNTER — Encounter: Payer: Self-pay | Admitting: Family Medicine

## 2023-01-31 VITALS — BP 137/68 | HR 59 | Temp 98.9°F | Ht 67.0 in | Wt 190.0 lb

## 2023-01-31 DIAGNOSIS — E559 Vitamin D deficiency, unspecified: Secondary | ICD-10-CM

## 2023-01-31 DIAGNOSIS — Z794 Long term (current) use of insulin: Secondary | ICD-10-CM

## 2023-01-31 DIAGNOSIS — J449 Chronic obstructive pulmonary disease, unspecified: Secondary | ICD-10-CM

## 2023-01-31 DIAGNOSIS — E1159 Type 2 diabetes mellitus with other circulatory complications: Secondary | ICD-10-CM | POA: Diagnosis not present

## 2023-01-31 DIAGNOSIS — R899 Unspecified abnormal finding in specimens from other organs, systems and tissues: Secondary | ICD-10-CM

## 2023-01-31 DIAGNOSIS — E1165 Type 2 diabetes mellitus with hyperglycemia: Secondary | ICD-10-CM | POA: Diagnosis not present

## 2023-01-31 DIAGNOSIS — I152 Hypertension secondary to endocrine disorders: Secondary | ICD-10-CM

## 2023-01-31 DIAGNOSIS — Z91199 Patient's noncompliance with other medical treatment and regimen due to unspecified reason: Secondary | ICD-10-CM

## 2023-01-31 DIAGNOSIS — E1140 Type 2 diabetes mellitus with diabetic neuropathy, unspecified: Secondary | ICD-10-CM

## 2023-01-31 MED ORDER — OZEMPIC (0.25 OR 0.5 MG/DOSE) 2 MG/1.5ML ~~LOC~~ SOPN: 0.2500 mg | PEN_INJECTOR | SUBCUTANEOUS | 2 refills | Status: DC

## 2023-01-31 NOTE — Addendum Note (Signed)
Addended by: Donzetta Kohut on: 01/31/2023 07:59 PM   Modules accepted: Orders

## 2023-01-31 NOTE — Progress Notes (Addendum)
New Patient Office Visit  Subjective    Patient ID: Mark Walls, male    DOB: May 10, 1956  Age: 67 y.o. MRN: [id number]  CC:  Chief Complaint  Patient presents with   New Patient (Initial Visit)    #1 problem - Diabetic control Medication management    HPI Mark Walls presents today with his wife to establish care. Previously managed by Syliva Overman of [facility]. Lives with wife and son. Does not work currently, is retired from [employer].   Pt expresses his main concern today is getting his Diabetes under control.   Diabetes He presents for his follow-up diabetic visit. He has type 2 diabetes mellitus. His disease course has been worsening (A1C 8.5% today in clinic, previously 7.7%). Hypoglycemia symptoms include sleepiness. (Last episode of low BG was 6-7 months ago per pt with a BG in the 60s. No drops in last month. ) Associated symptoms include blurred vision, fatigue, foot paresthesias, visual change and weakness. Pertinent negatives for diabetes include no foot ulcerations and no weight loss. Hypoglycemia complications include required assistance. Pertinent negatives for hypoglycemia complications include no blackouts and no hospitalization. Symptoms are worsening. Diabetic complications include PVD. Risk factors for coronary artery disease include diabetes mellitus, dyslipidemia, hypertension, male sex, tobacco exposure and sedentary lifestyle. Current diabetic treatment includes oral agent (dual therapy) and insulin injections. He is compliant with treatment some of the time (currently out of Levemir. Has not taken it in 3 weeks.). He is currently taking insulin pre-breakfast and pre-dinner (pt does not like giving himself injections and would like to move to something with less injections). Insulin injections are given by patient. Rotation sites for injection include the abdominal wall. He is following a generally unhealthy diet. When asked about meal planning,  he reported none. Prior visit with dietitian: will plan for dietitian visit. He rarely participates in exercise. His home blood glucose trend is increasing steadily (BG ranges 200-220s on home monitor.). His breakfast blood glucose range is generally >200 mg/dl. An ACE inhibitor/angiotensin II receptor blocker is not being taken (pt has ARB ordered. Discussed with patient benefit of taking mediction.). He does not see a podiatrist.Eye exam is not current (pt and wife verbalized that they planned to schedule eye exam this year).  Hypertension This is a chronic problem. The current episode started more than 1 year ago. The problem is controlled. Associated symptoms include blurred vision. Pertinent negatives include no peripheral edema. There are no associated agents to hypertension. Risk factors for coronary artery disease include diabetes mellitus, male gender, obesity, sedentary lifestyle and smoking/tobacco exposure. Past treatments include calcium channel blockers and angiotensin blockers. Compliance problems include medication cost, medication side effects, diet and exercise.  Hypertensive end-organ damage includes PVD.  BP controlled in clinic today. Has machine at home to check, but has not been monitoring it regularly at home. When he has checked it at home states it is 152/90. Pt has not been taking amlodipine or olmesartan   COPD  Symptoms well controlled today in clinic. Not on any medications at this time. No concerns from patient or wife.   Vitamin D Pt previously prescribed Vitamin D supplementation for deficiency. Pt is not currently taking supplementation.    Outpatient Encounter Medications as of 01/31/2023  Medication Sig   ACCU-CHEK SOFTCLIX LANCETS lancets Test twice daily   aspirin 81 MG tablet Take 81 mg by mouth daily.   Azelastine HCl 137 MCG/SPRAY SOLN SPRAY 2 SPRAYS INTO BOTH NOSTRILS  TWICE DAILY AS DIRECTED.   Blood Glucose Monitoring Suppl (ACCU-CHEK AVIVA PLUS) w/Device  KIT 1 kit for daily testing dx e11.9   clotrimazole-betamethasone (LOTRISONE) cream Apply topically 2 (two) times daily.   diclofenac Sodium (VOLTAREN) 1 % GEL SMARTSIG:Gram(s) Topical 4 Times Daily   fluticasone (FLONASE) 50 MCG/ACT nasal spray 2 SPRAYS INTO BOTH NOSTRILS DAILY.   gabapentin (NEURONTIN) 300 MG capsule Take 1 capsule (300 mg total) by mouth at bedtime. (Patient not taking: Reported on 01/31/2023)   glucose blood (ACCU-CHEK AVIVA PLUS) test strip USE TO CHECK BLOOD SUGAR FOUR TIMES DAILY   insulin detemir (LEVEMIR FLEXTOUCH) 100 UNIT/ML FlexPen Inject 50 units under the skin twice daily   Insulin Pen Needle (B-D UF III MINI PEN NEEDLES) 31G X 5 MM MISC USE WITH LANTUS DAILY   oxyCODONE-acetaminophen (PERCOCET) 10-325 MG tablet Take 1 tablet by mouth every 6 (six) hours as needed.   sitaGLIPtin-metformin (JANUMET) 50-1000 MG tablet Take 1 tablet by mouth 2 (two) times daily with a meal.   traMADol (ULTRAM) 50 MG tablet Take 50 mg by mouth 3 (three) times daily as needed.   amLODipine (NORVASC) 10 MG tablet Take 1 tablet (10 mg total) by mouth daily. (Patient not taking: Reported on 01/31/2023)   chlorthalidone (HYGROTON) 25 MG tablet Take one tablet by mouth once daily (Patient not taking: Reported on 01/31/2023)   ergocalciferol (VITAMIN D2) 1.25 MG (50000 UT) capsule Take 1 capsule (50,000 Units total) by mouth once a week. One capsule once weekly (Patient not taking: Reported on 01/31/2023)   fenofibrate (TRICOR) 48 MG tablet Take 1 tablet (48 mg total) by mouth daily. (Patient not taking: Reported on 01/31/2023)   glipiZIDE (GLUCOTROL XL) 10 MG 24 hr tablet TAKE 2 TABLETS BY MOUTH EVERY MORNING WITH BREAKFAST (Patient not taking: Reported on 01/31/2023)   olmesartan (BENICAR) 20 MG tablet Take 1 tablet (20 mg total) by mouth daily. (Patient not taking: Reported on 01/31/2023)   spironolactone (ALDACTONE) 25 MG tablet TAKE 1 TABLET(25 MG) BY MOUTH DAILY (Patient not taking: Reported on  03/03/2022)   [DISCONTINUED] naloxone (NARCAN) nasal spray 4 mg/0.1 mL Place 1 spray into the nose once. As needed for up to 1 dose (Patient not taking: Reported on 03/03/2022)   No facility-administered encounter medications on file as of 01/31/2023.    Past Medical History:  Diagnosis Date   Anxiety    Chronic fatigue    Depression    Diabetes mellitus, type 2 (HCC)    ED (erectile dysfunction)    Hyperlipidemia    Hypertension    Nicotine addiction    Obesity    PVD (peripheral vascular disease) (HCC)     Past Surgical History:  Procedure Laterality Date   CHOLECYSTECTOMY     COLONOSCOPY  10/01/2002   [name]:small internal hemorrhoids/single small sessile polyp in the rectum/ otherwise normal. Hyperplastic polyp.   COLONOSCOPY WITH ESOPHAGOGASTRODUODENOSCOPY (EGD) N/A 08/19/2013   NWG:[id number] EG junction. Hiatal hernia. Abnormal gastric mucosa s/p bx: Internal hemorrhoids;  Otherwise, normal ileocolonoscopy - Status post segmental biopsy   ESOPHAGOGASTRODUODENOSCOPY  09/24/2002   [name]:gastritis of the antrum and the bodt of the stomach otherwise normal   left hand     left knee athroscopy  03/2008   polpectomy colon     right common iliac artery stenting     right elbow     ROTATOR CUFF REPAIR  09/01/08    Family History  Problem Relation Age of Onset   Cirrhosis Mother  Melanoma Mother    Mental illness Father        suicide    Mental illness Brother    Diabetes Brother    Hypertension Brother    Diabetes Brother    Hypertension Brother    Mental illness Brother    Melanoma Brother    Colon cancer Paternal Grandfather        age 52    Social History   Socioeconomic History   Marital status: Married    Spouse name: Not on file   Number of children: 2   Years of education: Not on file   Highest education level: Not on file  Occupational History   Occupation: disabled   Tobacco Use   Smoking status: Former    Packs/day: 1.00    Years: 40.00    Total  pack years: 40.00    Types: Cigarettes    Start date: 09/16/1970    Quit date: 11/17/2013    Years since quitting: 9.2   Smokeless tobacco: Never  Substance and Sexual Activity   Alcohol use: No    Alcohol/week: 0.0 standard drinks of alcohol   Drug use: No   Sexual activity: Yes    Partners: Female  Other Topics Concern   Not on file  Social History Narrative   Not on file   Social Determinants of Health   Financial Resource Strain: Low Risk  (07/06/2021)   Overall Financial Resource Strain (CARDIA)    Difficulty of Paying Living Expenses: Not hard at all  Food Insecurity: No Food Insecurity (07/06/2021)   Hunger Vital Sign    Worried About Running Out of Food in the Last Year: Never true    Ran Out of Food in the Last Year: Never true  Transportation Needs: No Transportation Needs (07/06/2021)   PRAPARE - Administrator, Civil Service (Medical): No    Lack of Transportation (Non-Medical): No  Physical Activity: Inactive (07/06/2021)   Exercise Vital Sign    Days of Exercise per Week: 0 days    Minutes of Exercise per Session: 0 min  Stress: No Stress Concern Present (07/06/2021)   [employer] of Occupational Health - Occupational Stress Questionnaire    Feeling of Stress : Not at all  Social Connections: Socially Isolated (07/06/2021)   Social Connection and Isolation Panel [NHANES]    Frequency of Communication with Friends and Family: More than three times a week    Frequency of Social Gatherings with Friends and Family: More than three times a week    Attends Religious Services: Never    Database administrator or Organizations: No    Attends Banker Meetings: Never    Marital Status: Separated  Intimate Partner Violence: Not At Risk (07/06/2021)   Humiliation, Afraid, Rape, and Kick questionnaire    Fear of Current or Ex-Partner: No    Emotionally Abused: No    Physically Abused: No    Sexually Abused: No    Review of Systems   Constitutional:  Positive for fatigue. Negative for chills, fever and weight loss.  Eyes:  Positive for blurred vision.  Respiratory: Negative.    Cardiovascular: Negative.   Musculoskeletal:  Positive for back pain, joint pain and myalgias. Negative for falls.  Skin:  Negative for itching and rash.  Neurological:  Positive for weakness.  Psychiatric/Behavioral: Negative.    All other systems reviewed and are negative.  As per HPI     Objective    BP  137/68   Pulse (!) 59   Temp 98.9 F (37.2 C)   Ht 5\' 7"  (1.702 m)   Wt 190 lb (86.2 kg)   SpO2 95%   BMI 29.76 kg/m   Physical Exam Constitutional:      General: He is not in acute distress.    Appearance: He is obese. He is not ill-appearing, toxic-appearing or diaphoretic.  Cardiovascular:     Rate and Rhythm: Normal rate.     Pulses: Normal pulses.     Heart sounds: Normal heart sounds. No murmur heard.    No friction rub. No gallop.  Pulmonary:     Effort: Pulmonary effort is normal. No respiratory distress.     Breath sounds: Normal breath sounds. No stridor. No wheezing, rhonchi or rales.  Abdominal:     General: Abdomen is flat. Bowel sounds are normal. There is no distension.     Palpations: Abdomen is soft. There is no mass.     Tenderness: There is no abdominal tenderness. There is no guarding or rebound.     Hernia: No hernia is present.  Musculoskeletal:     Cervical back: Normal range of motion.     Comments: ROM limited by pain   Skin:    General: Skin is warm and dry.     Capillary Refill: Capillary refill takes less than 2 seconds.  Neurological:     General: No focal deficit present.     Mental Status: He is alert and oriented to person, place, and time. Mental status is at baseline.  Psychiatric:        Mood and Affect: Mood normal.        Behavior: Behavior normal.        Thought Content: Thought content normal.        Judgment: Judgment normal.    Assessment & Plan:  Mark Walls was seen today  for new patient (initial visit).  Diagnoses and all orders for this visit:  Type 2 diabetes mellitus with hyperglycemia, with long-term current use of insulin (HCC) Type 2 diabetes mellitus with diabetic neuropathy, with long-term current use of insulin (HCC) -     CMP14+EGFR -     CBC with Differential/Platelet -     Bayer DCA Hb A1c Waived -  Lipid Panel (Future) -     Semaglutide,0.25 or 0.5MG /DOS, (OZEMPIC, 0.25 OR 0.5 MG/DOSE,) 2 MG/1.5ML SOPN; Inject 0.25 mg into the skin once a week. Instructed patient and verified understanding with verbal teachback for patient to stop taking Janumet once he obtained Ozempic. Educated patient and wife on side effect profile of medication. Labs as above to monitor. Will communicate results to patient once available. Pt provided BG log to monitor BG for 2 weeks at home and report numbers back to clinic. Pt not fasting today. Instructed patient to return for lipid panel when he is fasting.   Hypertension associated with diabetes (HCC) Pt not currently taking medications as prescribed. Educated patient on risks of HTN if left untreated. See plan below for coordinated with CCM for medication compliance. Pt provided BP log to monitor BP for 2 weeks at home and report numbers back to clinic. Will determine medication adjustments at this time in chronic condition follow up.   Abnormal laboratory test Vitamin D deficiency -     Vitamin D, 25-hydroxy Pt previously prescribed Vitamin D supplementation. Pt not currently taking medication as prescribed. Will recheck lab as above and determine plan to supplement based on results. Will  communicate results with patient once available.   Chronic obstructive pulmonary disease, unspecified COPD type (HCC) Continue current regimen. Will follow up at chronic condition follow up.   Noncompliance with treatment regimen Pt currently not taking several medications as prescribed. Will plan to refer patient to Chronic Care  Management for assistance with medications and compliance at follow up.   The above assessment and management plan was discussed with the patient. The patient verbalized understanding of and has agreed to the management plan using shared-decision making. Patient is aware to call the clinic if they develop any new symptoms or if symptoms fail to improve or worsen. Patient is aware when to return to the clinic for a follow-up visit. Patient educated on when it is appropriate to go to the emergency department.    Neale Burly, FNP

## 2023-02-01 LAB — CBC WITH DIFFERENTIAL/PLATELET
Basophils Absolute: 0.1 10*3/uL (ref 0.0–0.2)
Basos: 1 %
EOS (ABSOLUTE): 0.2 10*3/uL (ref 0.0–0.4)
Eos: 2 %
Hematocrit: 43.6 % (ref 37.5–51.0)
Hemoglobin: 15 g/dL (ref 13.0–17.7)
Immature Grans (Abs): 0 10*3/uL (ref 0.0–0.1)
Immature Granulocytes: 0 %
Lymphocytes Absolute: 2.4 10*3/uL (ref 0.7–3.1)
Lymphs: 28 %
MCH: 30.4 pg (ref 26.6–33.0)
MCHC: 34.4 g/dL (ref 31.5–35.7)
MCV: 88 fL (ref 79–97)
Monocytes Absolute: 0.5 10*3/uL (ref 0.1–0.9)
Monocytes: 6 %
Neutrophils Absolute: 5.5 10*3/uL (ref 1.4–7.0)
Neutrophils: 63 %
Platelets: 267 10*3/uL (ref 150–450)
RBC: 4.94 x10E6/uL (ref 4.14–5.80)
RDW: 12.5 % (ref 11.6–15.4)
WBC: 8.7 10*3/uL (ref 3.4–10.8)

## 2023-02-01 LAB — CMP14+EGFR
ALT: 35 IU/L (ref 0–44)
AST: 21 IU/L (ref 0–40)
Albumin/Globulin Ratio: 1.6 (ref 1.2–2.2)
Albumin: 4.4 g/dL (ref 3.9–4.9)
Alkaline Phosphatase: 96 IU/L (ref 44–121)
BUN/Creatinine Ratio: 18 (ref 10–24)
BUN: 15 mg/dL (ref 8–27)
Bilirubin Total: 0.5 mg/dL (ref 0.0–1.2)
CO2: 22 mmol/L (ref 20–29)
Calcium: 10.4 mg/dL — ABNORMAL HIGH (ref 8.6–10.2)
Chloride: 101 mmol/L (ref 96–106)
Creatinine, Ser: 0.82 mg/dL (ref 0.76–1.27)
Globulin, Total: 2.7 g/dL (ref 1.5–4.5)
Glucose: 239 mg/dL — ABNORMAL HIGH (ref 70–99)
Potassium: 4.9 mmol/L (ref 3.5–5.2)
Sodium: 138 mmol/L (ref 134–144)
Total Protein: 7.1 g/dL (ref 6.0–8.5)
eGFR: 96 mL/min/{1.73_m2} (ref >59)

## 2023-02-01 LAB — VITAMIN D 25 HYDROXY (VIT D DEFICIENCY, FRACTURES): Vit D, 25-Hydroxy: 20.7 ng/mL — ABNORMAL LOW (ref 30.0–100.0)

## 2023-02-01 LAB — BAYER DCA HB A1C WAIVED: HB A1C (BAYER DCA - WAIVED): 8.5 % — ABNORMAL HIGH (ref 4.8–5.6)

## 2023-02-02 ENCOUNTER — Other Ambulatory Visit: Payer: Self-pay | Admitting: Family Medicine

## 2023-02-02 DIAGNOSIS — E559 Vitamin D deficiency, unspecified: Secondary | ICD-10-CM

## 2023-02-02 MED ORDER — VITAMIN D (ERGOCALCIFEROL) 1.25 MG (50000 UNIT) PO CAPS: 50000.0000 [IU] | ORAL_CAPSULE | ORAL | 0 refills | Status: DC

## 2023-02-14 ENCOUNTER — Ambulatory Visit: Payer: Medicare Other | Admitting: Family Medicine

## 2023-02-15 ENCOUNTER — Encounter: Payer: Self-pay | Admitting: Family Medicine

## 2023-02-27 ENCOUNTER — Telehealth: Payer: Self-pay | Admitting: Family Medicine

## 2023-02-27 NOTE — Telephone Encounter (Signed)
Contacted Jarrett Soho to schedule their annual wellness visit. Appointment made for 03/09/2023.  Thank you,  Colletta Maryland,  [facility] [facility] Direct Dial ??[phone]

## 2023-03-01 ENCOUNTER — Telehealth: Payer: Self-pay | Admitting: Family Medicine

## 2023-03-02 ENCOUNTER — Telehealth: Payer: Self-pay | Admitting: Family Medicine

## 2023-03-02 NOTE — Telephone Encounter (Signed)
Daughter calls back because she never heard anything back yesterday. Patient is wanting to sleep a lot and tired a lot. Please call.

## 2023-03-02 NOTE — Telephone Encounter (Signed)
Patient is out of Ozempic and has to take shot on Sunday and will need a refill called in. Please call daughter when called in. Patient has appt 3-19.

## 2023-03-02 NOTE — Telephone Encounter (Signed)
Spoke to wife per signed DPR. She verbalized understanding. Will bring patient next Tuesday.

## 2023-03-05 NOTE — Telephone Encounter (Signed)
Ozempic given every Sunday.  Added Glimepiride or Glipizide? Daughter states that it is an old prescription from another doctor. She picked this up because blood sugar was 250.  Advised that treatment regimen will need to be discussed in an office visit. They will continue what they have been doing until the appt. Wife is supposed to come with pt to the appt. Daughter states that he cannot remember things and needs help. She will try to come to other appts she just has a lot of her own things to take care of.   Daughter asked if Ozempic could be increased. Informed that it could be but will need to be addressed in an appt. She understood.

## 2023-03-06 ENCOUNTER — Other Ambulatory Visit: Payer: Self-pay | Admitting: Family Medicine

## 2023-03-06 ENCOUNTER — Encounter: Payer: Self-pay | Admitting: Family Medicine

## 2023-03-06 ENCOUNTER — Ambulatory Visit (INDEPENDENT_AMBULATORY_CARE_PROVIDER_SITE_OTHER): Payer: Medicare Other | Admitting: Family Medicine

## 2023-03-06 VITALS — BP 133/71 | HR 62 | Temp 98.5°F | Ht 67.0 in | Wt 191.0 lb

## 2023-03-06 DIAGNOSIS — E785 Hyperlipidemia, unspecified: Secondary | ICD-10-CM

## 2023-03-06 DIAGNOSIS — G894 Chronic pain syndrome: Secondary | ICD-10-CM

## 2023-03-06 DIAGNOSIS — E1169 Type 2 diabetes mellitus with other specified complication: Secondary | ICD-10-CM | POA: Diagnosis not present

## 2023-03-06 DIAGNOSIS — G4733 Obstructive sleep apnea (adult) (pediatric): Secondary | ICD-10-CM | POA: Diagnosis not present

## 2023-03-06 DIAGNOSIS — Z794 Long term (current) use of insulin: Secondary | ICD-10-CM

## 2023-03-06 DIAGNOSIS — E1165 Type 2 diabetes mellitus with hyperglycemia: Secondary | ICD-10-CM | POA: Diagnosis not present

## 2023-03-06 DIAGNOSIS — E1065 Type 1 diabetes mellitus with hyperglycemia: Secondary | ICD-10-CM

## 2023-03-06 DIAGNOSIS — Z91199 Patient's noncompliance with other medical treatment and regimen due to unspecified reason: Secondary | ICD-10-CM

## 2023-03-06 DIAGNOSIS — E559 Vitamin D deficiency, unspecified: Secondary | ICD-10-CM

## 2023-03-06 DIAGNOSIS — Z7985 Long-term (current) use of injectable non-insulin antidiabetic drugs: Secondary | ICD-10-CM

## 2023-03-06 MED ORDER — ACCU-CHEK AVIVA PLUS VI STRP: ORAL_STRIP | 4 refills | Status: AC

## 2023-03-06 MED ORDER — OLMESARTAN MEDOXOMIL 20 MG PO TABS: 20.0000 mg | ORAL_TABLET | Freq: Every day | ORAL | 0 refills | Status: DC

## 2023-03-06 MED ORDER — DICLOFENAC SODIUM 1 % EX GEL: CUTANEOUS | 0 refills | Status: AC

## 2023-03-06 MED ORDER — VITAMIN D (ERGOCALCIFEROL) 1.25 MG (50000 UNIT) PO CAPS: 50000.0000 [IU] | ORAL_CAPSULE | ORAL | 0 refills | Status: AC

## 2023-03-06 MED ORDER — OZEMPIC (0.25 OR 0.5 MG/DOSE) 2 MG/1.5ML ~~LOC~~ SOPN: 0.5000 mg | PEN_INJECTOR | SUBCUTANEOUS | 0 refills | Status: DC

## 2023-03-06 MED ORDER — ACCU-CHEK SOFTCLIX LANCETS MISC: 6 refills | Status: AC

## 2023-03-06 NOTE — Progress Notes (Signed)
Acute Office Visit  Subjective:  Patient ID: Mark Walls, male    DOB: Dec 29, 1955, 67 y.o.   MRN: [id number]  Chief Complaint  Patient presents with   Medical Management of Chronic Issues    HPI Patient is in today for follow up of chronic conditions  Diabetes Mellitus Type II, Follow-up  Lab Results  Component Value Date   HGBA1C 8.5 (H) 01/31/2023   HGBA1C 7.7 (A) 03/03/2022   HGBA1C 8.0 (A) 06/06/2021   Wt Readings from Last 3 Encounters:  03/06/23 191 lb (86.6 kg)  01/31/23 190 lb (86.2 kg)  03/03/22 196 lb 0.6 oz (88.9 kg)   Last seen for diabetes 1 months ago.  Management since then includes ozempic and glipizide. Pt was prescribed ozempic  He reports poor compliance with treatment. He is having side effects. Fatigue and hungry all the time  Symptoms: Yes fatigue No foot ulcerations  Yes appetite changes No nausea  No paresthesia of the feet  No polydipsia  No polyuria No visual disturbances   No vomiting     Home blood sugar records: fasting range: 190 Pt did not bring records of BG.  Episodes of hypoglycemia? No    Current insulin regiment: none Most Recent Eye Exam: not up to date.  Current exercise: none Current diet habits: in general, an "unhealthy" diet  Pertinent Labs: Lab Results  Component Value Date   CHOL 247 (H) 03/03/2022   HDL 35 (L) 03/03/2022   LDLCALC 177 (H) 03/03/2022   TRIG 188 (H) 03/03/2022   CHOLHDL 7.1 (H) 03/03/2022   Lab Results  Component Value Date   NA 138 01/31/2023   K 4.9 01/31/2023   CREATININE 0.82 01/31/2023   EGFR 96 01/31/2023   LABMICR 87.3 03/03/2022   MICRALBCREAT 26 03/03/2022     Pt has not been taking anything for hyperlipidemia.   Pt states that they do not believe their BP is elevated and sometimes takes their olmesartan. Takes it "about every other day"   Vitamin D was not picked up from the pharmacy. Pt remains fatigued and continues to have pain.   ---------------------------------------------------------------------------------------------------  ROS As per HPI   Objective:  BP 133/71   Pulse 62   Temp 98.5 F (36.9 C)   Ht 5\' 7"  (1.702 m)   Wt 191 lb (86.6 kg)   SpO2 97%   BMI 29.91 kg/m   Physical Exam Constitutional:      General: He is not in acute distress.    Appearance: Normal appearance. He is not ill-appearing, toxic-appearing or diaphoretic.  Cardiovascular:     Rate and Rhythm: Normal rate.     Pulses: Normal pulses.     Heart sounds: Normal heart sounds. No murmur heard.    No gallop.  Pulmonary:     Effort: Pulmonary effort is normal. No respiratory distress.     Breath sounds: Normal breath sounds. No stridor. No wheezing, rhonchi or rales.  Skin:    General: Skin is warm.     Capillary Refill: Capillary refill takes less than 2 seconds.  Neurological:     General: No focal deficit present.     Mental Status: He is alert and oriented to person, place, and time. Mental status is at baseline.     Motor: No weakness.  Psychiatric:        Mood and Affect: Mood normal.        Behavior: Behavior normal.        Thought  Content: Thought content normal.        Judgment: Judgment normal.    Assessment & Plan:  1. Type 2 diabetes mellitus with hyperglycemia, with long-term current use of insulin (HCC) 2. Uncontrolled type 1 diabetes mellitus with hyperglycemia (HCC) Labs as below. Will communicate results to patient once available.  Ozempic increased to 0.5mg  weekly. Per pt and wife tolerated 0.25mg  of Ozempic well.  Instructed pt and wife to stop taking previously prescribed glipizide.  Refills placed for lancets and blood glucose strips for patient to continue monitoring BG. Pt instructed to bring measurements to follow up appointment.  Referral placed for ophthalmology and podiatry for diabetic exams.  Referral placed for diabetic education for nutrition and medication education.  - Microalbumin /  creatinine urine ratio - AMB Referral to Chronic Care Management Services - Ambulatory referral to diabetic education - Ambulatory referral to Ophthalmology - Ambulatory referral to Podiatry - Accu-Chek Softclix Lancets lancets; Test twice daily  Dispense: 100 each; Refill: 6 - glucose blood (ACCU-CHEK AVIVA PLUS) test strip; USE TO CHECK BLOOD SUGAR FOUR TIMES DAILY  Dispense: 150 strip; Refill: 4 - Semaglutide,0.25 or 0.5MG /DOS, (OZEMPIC, 0.25 OR 0.5 MG/DOSE,) 2 MG/1.5ML SOPN; Inject 0.5 mg into the skin once a week.  Dispense: 3 mL; Refill: 0  3. Hyperlipidemia associated with type 2 diabetes mellitus (HCC) Labs as below. Will communicate results to patient once available.  Not fasting. Pt was not able to make it into clinic for nonfasting labs. Will collect nonfasting labs today to evaluate lipid panel. Pt is not currently taking any medications for hyperlipidemia. Previously prescribed zetia. Stopped for unknown reason. Pt unable to tolerate statins due to elevated liver enzymes.  - Lipid Panel  4. Vitamin D deficiency Discussed with patient that fatigue symptoms may be due to decreased vitamin D level. Supplement provided below. Previously not picked up at pharmacy. Will recheck on follow up labs.  - Vitamin D, Ergocalciferol, (DRISDOL) 1.25 MG (50000 UNIT) CAPS capsule; Take 1 capsule (50,000 Units total) by mouth every 7 (seven) days.  Dispense: 8 capsule; Refill: 0  5. Obstructive sleep apnea Pt previously diagnosed with Moderate Obstructive Sleep Apnea during sleep study per chart review of 10/24/2016 procedures. Pt was due to return to sleep center for re-evaluation after 4 weeks of therapy, has not followed up with sleep center since then. Discussed with patient and wife that fatigue and daytime sleepiness may also be due to OSA. Referral placed as below for re-evaluation.  - Ambulatory referral to Sleep Studies  6. Pain syndrome, chronic Patient pain managed by pain clinic. Pt  uses voltaren gel for breakthrough pain in joints. Will refill gel as below.  - diclofenac Sodium (VOLTAREN) 1 % GEL; SMARTSIG:Gram(s) Topical 4 Times Daily  Dispense: 4 g; Refill: 0  7. Noncompliance with treatment regimen Pt and wife need repeated education on safe medication practices and managing chronic condition. During exam, pt unable to describe which medications treat which conditions.  Provided pt with updated medication list. Provided pt and wife with handout for diabetes nutrition. Referral placed as above for CCM.   The above assessment and management plan was discussed with the patient. The patient verbalized understanding of and has agreed to the management plan using shared-decision making. Patient is aware to call the clinic if they develop any new symptoms or if symptoms fail to improve or worsen. Patient is aware when to return to the clinic for a follow-up visit. Patient educated on when it is appropriate  to go to the emergency department.   Instructed pt to follow up in 4 weeks for management of chronic conditions and to monitor dose change of Ozempic, Vit D, Lipids.   Neale Burly, DNP-FNP [facility] [address] [place], [place] [address] [phone]

## 2023-03-07 LAB — MICROALBUMIN / CREATININE URINE RATIO
Creatinine, Urine: 95.4 mg/dL
Microalb/Creat Ratio: 12 mg/g creat (ref 0–29)
Microalbumin, Urine: 11.1 ug/mL

## 2023-03-07 LAB — LIPID PANEL
Chol/HDL Ratio: 5.6 ratio — ABNORMAL HIGH (ref 0.0–5.0)
Cholesterol, Total: 192 mg/dL (ref 100–199)
HDL: 34 mg/dL — ABNORMAL LOW (ref >39)
LDL Chol Calc (NIH): 132 mg/dL — ABNORMAL HIGH (ref 0–99)
Triglycerides: 146 mg/dL (ref 0–149)
VLDL Cholesterol Cal: 26 mg/dL (ref 5–40)

## 2023-03-09 ENCOUNTER — Telehealth: Payer: Self-pay

## 2023-03-09 NOTE — Progress Notes (Unsigned)
Chronic Care Management   Note  03/09/2023 Name: Mark Walls MRN: [id number] DOB: 14-Mar-1956  Mark Walls is a 67 y.o. year old male who is a primary care patient of Ellamae Sia, Aleen Campi, FNP. I reached out to Mark Walls by phone today in response to a referral sent by [name]'s PCP.  The first contact attempt was unsuccessful.   Follow up plan: Additional outreach attempts will be made.  Penne Lash, RMA Care Guide [facility]  [place], [place] [address] Direct Dial: [phone] [email or web address]

## 2023-03-12 NOTE — Progress Notes (Unsigned)
Chronic Care Management   Note  03/12/2023 Name: Mark Walls MRN: [id number] DOB: 11-23-1956  Mark Walls is a 67 y.o. year old male who is a primary care patient of Ellamae Sia, Aleen Campi, FNP. I reached out to Mark Walls by phone today in response to a referral sent by Mark Walls's PCP.  The second contact attempt was unsuccessful.   Follow up plan: Additional outreach attempts will be made.  Penne Lash, RMA Care Guide [facility]  [place], [place] [address] Direct Dial: [phone] [email or web address]

## 2023-03-14 NOTE — Progress Notes (Signed)
Chronic Care Management   Note  03/14/2023 Name: Mark Walls MRN: [id number] DOB: 04-Jun-1956  Mark Walls is a 67 y.o. year old male who is a primary care patient of Ellamae Sia, Aleen Campi, FNP. I reached out to Mark Walls by phone today in response to a referral sent by Mark Walls PCP.  Mark Walls was given information about Chronic Care Management services today including:  CCM service includes personalized support from designated clinical staff supervised by the physician, including individualized plan of care and coordination with other care providers 24/7 contact phone numbers for assistance for urgent and routine care needs. Service will only be billed when office clinical staff spend 20 minutes or more in a month to coordinate care. Only one practitioner may furnish and bill the service in a calendar month. The patient may stop CCM services at amy time (effective at the end of the month) by phone call to the office staff. The patient will be responsible for cost sharing (co-pay) or up to 20% of the service fee (after annual deductible is met)  Mark Walls  agreedto scheduling an appointment with the CCM RN Case Manager   Follow up plan: Patient agreed to scheduled appointment with RN Case Manager on 03/26/2023 pharm d 04/12/2023(date/time).   Penne Lash, RMA Care Guide [facility]  [place], [place] [address] Direct Dial: [phone] [email or web address]

## 2023-03-22 ENCOUNTER — Telehealth: Payer: Self-pay | Admitting: Family Medicine

## 2023-03-22 NOTE — Telephone Encounter (Signed)
Contacted Mark Walls to schedule their annual wellness visit. Appointment made for 03/29/2023.  Thank you,  Colletta Maryland,  [facility] Direct Dial ??[phone]

## 2023-03-26 ENCOUNTER — Ambulatory Visit (INDEPENDENT_AMBULATORY_CARE_PROVIDER_SITE_OTHER): Payer: Medicare Other | Admitting: *Deleted

## 2023-03-26 DIAGNOSIS — J449 Chronic obstructive pulmonary disease, unspecified: Secondary | ICD-10-CM

## 2023-03-26 DIAGNOSIS — E1165 Type 2 diabetes mellitus with hyperglycemia: Secondary | ICD-10-CM

## 2023-03-26 NOTE — Patient Instructions (Signed)
Please call the care guide team at [phone] if you need to cancel or reschedule your appointment.   If you are experiencing a Mental Health or Behavioral Health Crisis or need someone to talk to, please call the Suicide and Crisis Lifeline: 988 call the [place] National Suicide Prevention Lifeline: (409) 083-1186 or TTY: 510-266-1976 TTY 878-440-4744) to talk to a trained counselor call 1-800-273-TALK (toll free, 24 hour hotline) go to [facility] [address], [place] [phone]) call the [facility]: [phone] call 911   Following is a copy of the CCM Program Consent:  CCM service includes personalized support from designated clinical staff supervised by the physician, including individualized plan of care and coordination with other care providers 24/7 contact phone numbers for assistance for urgent and routine care needs. Service will only be billed when office clinical staff spend 20 minutes or more in a month to coordinate care. Only one practitioner may furnish and bill the service in a calendar month. The patient may stop CCM services at amy time (effective at the end of the month) by phone call to the office staff. The patient will be responsible for cost sharing (co-pay) or up to 20% of the service fee (after annual deductible is met)  Following is a copy of your full provider care plan:   Goals Addressed             This Visit's Progress    CCM (COPD) EXPECTED OUTCOME: MONITOR, SELF-MANAGE AND REDUCE SYMPTOMS OF COPD       Current Barriers:  Knowledge Deficits related to COPD management Care Coordination needs related to medication management in a patient with COPD Chronic Disease Management support and education needs related to COPD, action plan No Advanced Directives in place- pt declines Patient reports he lives alone but has male friend that stays at his house most of time, pt reports he is overall  independent with ADL, IADL's, uses a cane as needed Patient reports several referrals were placed at 03/06/23 primary care provider appointment- Referrals as follows: podiatry- pt states he has this appointment in place,  ophthalmology- pt states he has not heard and wants information so he can schedule diabetic eye checkup,  diabetes and nutrition education- pt states he is not interested in this,  sleep study- pt states he had study in the past and not interested in following up any further, states "didn't like the machine"  Planned Interventions: Provided patient with basic written and verbal COPD education on self care/management/and exacerbation prevention Advised patient to track and manage COPD triggers Provided instruction about proper use of medications used for management of COPD including inhalers Advised patient to self assesses COPD action plan zone and make appointment with provider if in the yellow zone for 48 hours without improvement Provided education about and advised patient to utilize infection prevention strategies to reduce risk of respiratory infection Discussed the importance of adequate rest and management of fatigue with COPD Screening for signs and symptoms of depression related to chronic disease state  Assessed social determinant of health barriers RN care manager gave information for ophthalmologist [facility] in [place] at [phone] and encouraged pt to schedule eye exam  Symptom Management: Take medications as prescribed   Attend all scheduled provider appointments Call pharmacy for medication refills 3-7 days in advance of running out of medications Attend church or other social activities Perform all self care activities independently  Perform IADL's (shopping, preparing meals, housekeeping, managing finances)  independently Call provider office for new concerns or questions  identify and remove indoor air pollutants listen for public air quality  announcements every day do breathing exercises every day develop a rescue plan follow rescue plan if symptoms flare-up get at least 7 to 8 hours of sleep at night practice relaxation or meditation daily Look over education sent via My Chart- COPD action plan Call [facility] in [place] at [phone]  Follow Up Plan: Telephone follow up appointment with care management team member scheduled for:  06/12/23 at 130 pm       CCM (DIABETES) EXPECTED OUTCOME: MONITOR, SELF-MANAGE AND REDUCE SYMPTOMS OF DIABETES       Current Barriers:  Knowledge Deficits related to Diabetes management Chronic Disease Management support and education needs related to Diabetes and diet No Advanced Directives in place- pt declined Patient reports he checks CBG once daily fasting with ranges 140-150's range, pt not interested in Diabetes Education/ nutrition classes PharmD to outreach pt on 04/12/23  Planned Interventions: Provided education to patient about basic DM disease process; Reviewed medications with patient and discussed importance of medication adherence;        Reviewed prescribed diet with patient carbohydrate modified; Counseled on importance of regular laboratory monitoring as prescribed;        Discussed plans with patient for ongoing care management follow up and provided patient with direct contact information for care management team;      Provided patient with written educational materials related to hypo and hyperglycemia and importance of correct treatment;       Advised patient, providing education and rationale, to check cbg once daily and record        call provider for findings outside established parameters;       Review of patient status, including review of consultants reports, relevant laboratory and other test results, and medications completed;       Screening for signs and symptoms of depression related to chronic disease state;        Assessed social determinant of health  barriers;        Reviewed upcoming scheduled appointments  Symptom Management: Take medications as prescribed   Attend all scheduled provider appointments Call pharmacy for medication refills 3-7 days in advance of running out of medications Attend church or other social activities Perform all self care activities independently  Perform IADL's (shopping, preparing meals, housekeeping, managing finances) independently Call provider office for new concerns or questions  check blood sugar at prescribed times: once daily check feet daily for cuts, sores or redness enter blood sugar readings and medication or insulin into daily log take the blood sugar log to all doctor visits take the blood sugar meter to all doctor visits trim toenails straight across fill half of plate with vegetables limit fast food meals to no more than 1 per week manage portion size prepare main meal at home 3 to 5 days each week read food labels for fat, fiber, carbohydrates and portion size set a realistic goal keep feet up while sitting Look over education in My Chart- hypoglycemia Pharmacist to outreach you on 04/12/23  Follow Up Plan: Telephone follow up appointment with care management team member scheduled for:  06/12/23 at 130 pm          Patient verbalizes understanding of instructions and care plan provided today and agrees to view in MyChart. Active MyChart status and patient understanding of how to access instructions and care plan via MyChart confirmed with patient.  Telephone follow up appointment with care management team member scheduled for:  06/12/23 at 130 pm  COPD Action Plan A COPD action plan is a description of what to do when you have a flare (exacerbation) of chronic obstructive pulmonary disease (COPD). Your action plan is a color-coded plan that lists the symptoms that indicate whether your condition is under control and what actions to take. If you have symptoms in the green zone,  it means you are doing well that day. If you have symptoms in the yellow zone, it means you are having a bad day or an exacerbation. If you have symptoms in the red zone, you need urgent medical care. Follow the plan that you and your health care provider developed. Review your plan with your health care provider at each visit. Red zone Symptoms in this zone mean that you should get medical help right away. They include: Feeling very short of breath, even when you are resting. Not being able to do any activities because of poor breathing. Not being able to sleep because of poor breathing. Fever or shaking chills. Feeling confused or very sleepy. Chest pain. Coughing up blood. If you have any of these symptoms, call emergency services (911 in the [place] or go to the nearest emergency room. Yellow zone Symptoms in this zone mean that your condition may be getting worse. They include: Feeling more short of breath than usual. Having less energy for daily activities than usual. Phlegm or mucus that is thicker than usual. Needing to use your rescue inhaler or nebulizer more often than usual. More ankle swelling than usual. Coughing more than usual. Feeling like you have a chest cold. Trouble sleeping due to COPD symptoms. Decreased appetite. COPD medicines not helping as much as usual. If you experience any "yellow" symptoms: Keep taking your daily medicines as directed. Use your quick-relief inhaler as told by your health care provider. If you were prescribed steroid medicine to take by mouth (oral medicine), start taking it as told by your health care provider. If you were prescribed an antibiotic medicine, start taking it as told by your health care provider. Do not stop taking the antibiotic even if you start to feel better. Use oxygen as told by your health care provider. Get more rest. Do your pursed-lip breathing exercises. Do not smoke. Avoid any irritants in the air. If your  signs and symptoms do not improve after taking these steps, call your health care provider right away. Green zone Symptoms in this zone mean that you are doing well. They include: Being able to do your usual activities and exercise. Having the usual amount of coughing, including the same amount of phlegm or mucus. Being able to sleep well. Having a good appetite. Where to find more information: You can find more information about COPD from: American Lung Association, My COPD Action Plan: www.lung.org COPD Foundation: www.copdfoundation.org National Heart, Lung, & Blood Institute: [employer] Follow these instructions at home: Continue taking your daily medicines as told by your health care provider. Make sure you receive all the immunizations that your health care provider recommends, especially the pneumococcal and influenza vaccines. Wash your hands often with soap and water. Have family members wash their hands too. Regular hand washing can help prevent infections. Follow your usual exercise and diet plan. Avoid irritants in the air, such as smoke. Do not use any products that contain nicotine or tobacco. These products include cigarettes, chewing tobacco, and vaping devices, such as e-cigarettes. If you need  help quitting, ask your health care provider. Summary A COPD action plan tells you what to do when you have a flare (exacerbation) of chronic obstructive pulmonary disease (COPD). Follow each action plan for your symptoms. If you have any symptoms in the red zone, call emergency services (911 in the [place] or go to the nearest emergency room. This information is not intended to replace advice given to you by your health care provider. Make sure you discuss any questions you have with your health care provider. Document Revised: 10/12/2020 Document Reviewed: 10/12/2020 Elsevier Patient Education  2023 [employer] Hypoglycemia Hypoglycemia occurs when the level of sugar  (glucose) in the blood is too low. Hypoglycemia can happen in people who have or do not have diabetes. It can develop quickly, and it can be a medical emergency. For most people, a blood glucose level below 70 mg/dL (3.9 mmol/L) is considered hypoglycemia. Glucose is a type of sugar that provides the body's main source of energy. Certain hormones (insulin and glucagon) control the level of glucose in the blood. Insulin lowers blood glucose, and glucagon raises blood glucose. Hypoglycemia can result from having too much insulin in the bloodstream, or from not eating enough food that contains glucose. You may also have reactive hypoglycemia, which happens within 4 hours after eating a meal. What are the causes? Hypoglycemia occurs most often in people who have diabetes and may be caused by: Diabetes medicine. Not eating enough, or not eating often enough. Increased physical activity. Drinking alcohol on an empty stomach. If you do not have diabetes, hypoglycemia may be caused by: A tumor in the pancreas. Not eating enough, or not eating for long periods at a time (fasting). A severe infection or illness. Problems after having bariatric surgery. Organ failure, such as kidney or liver failure. Certain medicines. What increases the risk? Hypoglycemia is more likely to develop in people who: Have diabetes and take medicines to lower blood glucose. Abuse alcohol. Have a severe illness. What are the signs or symptoms? Symptoms vary depending on whether the condition is mild, moderate, or severe. Mild hypoglycemia Hunger. Sweating and feeling clammy. Dizziness or feeling light-headed. Sleepiness or restless sleep. Nausea. Increased heart rate. Headache. Blurry vision. Mood changes, such as irritability or anxiety. Tingling or numbness around the mouth, lips, or tongue. Moderate hypoglycemia Confusion and poor judgment. Behavior changes. Weakness. Irregular heartbeat. A change in  coordination. Severe hypoglycemia Severe hypoglycemia is a medical emergency. It can cause: Fainting. Seizures. Loss of consciousness (coma). Death. How is this diagnosed? Hypoglycemia is diagnosed with a blood test to measure your blood glucose level. This blood test is done while you are having symptoms. Your health care provider may also do a physical exam and review your medical history. How is this treated? This condition can be treated by immediately eating or drinking something that contains sugar with 15 grams of fast-acting carbohydrate, such as: 4 oz (120 mL) of fruit juice. 4 oz (120 mL) of regular soda (not diet soda). Several pieces of hard candy. Check food labels to find out how many pieces to eat for 15 grams. 1 Tbsp (15 mL) of sugar or honey. 4 glucose tablets. 1 tube of glucose gel. Treating hypoglycemia if you have diabetes If you are alert and able to swallow safely, follow the 15:15 rule: Take 15 grams of a fast-acting carbohydrate. Talk with your health care provider about how much you should take. Options for getting 15 grams of fast-acting carbohydrate include: Glucose tablets (take  4 tablets). Several pieces of hard candy. Check food labels to find out how many pieces to eat for 15 grams. 4 oz (120 mL) of fruit juice. 4 oz (120 mL) of regular soda (not diet soda). 1 Tbsp (15 mL) of sugar or honey. 1 tube of glucose gel. Check your blood glucose 15 minutes after you take the carbohydrate. If the repeat blood glucose level is still at or below 70 mg/dL (3.9 mmol/L), take 15 grams of a carbohydrate again. If your blood glucose level does not increase above 70 mg/dL (3.9 mmol/L) after 3 tries, seek emergency medical care. After your blood glucose level returns to normal, eat a meal or a snack within 1 hour.  Treating severe hypoglycemia Severe hypoglycemia is when your blood glucose level is below 54 mg/dL (3 mmol/L). Severe hypoglycemia is a medical emergency.  Get medical help right away. If you have severe hypoglycemia and you cannot eat or drink, you will need to be given glucagon. A family member or close friend should learn how to check your blood glucose and how to give you glucagon. Ask your health care provider if you need to have an emergency glucagon kit available. Severe hypoglycemia may need to be treated in a hospital. The treatment may include getting glucose through an IV. You may also need treatment for the cause of your hypoglycemia. Follow these instructions at home:  General instructions Take over-the-counter and prescription medicines only as told by your health care provider. Monitor your blood glucose as told by your health care provider. If you drink alcohol: Limit how much you have to: 0-1 drink a day for women who are not pregnant. 0-2 drinks a day for men. Know how much alcohol is in your drink. In the [place] one drink equals one 12 oz bottle of beer (355 mL), one 5 oz glass of wine (148 mL), or one 1 oz glass of hard liquor (44 mL). Be sure to eat food along with drinking alcohol. Be aware that alcohol is absorbed quickly and may have lingering effects that may result in hypoglycemia later. Be sure to do ongoing glucose monitoring. Keep all follow-up visits. This is important. If you have diabetes: Always have a fast-acting carbohydrate (15 grams) option with you to treat low blood glucose. Follow your diabetes management plan as directed by your health care provider. Make sure you: Know the symptoms of hypoglycemia. It is important to treat it right away to prevent it from becoming severe. Check your blood glucose as often as told. Always check before and after exercise. Always check your blood glucose before you drive a motorized vehicle. Take your medicines as told. Follow your meal plan. Eat on time, and do not skip meals. Share your diabetes management plan with people in your workplace, school, and household. Carry  a medical alert card or wear medical alert jewelry. Where to find more information American Diabetes Association: www.diabetes.org Contact a health care provider if: You have problems keeping your blood glucose in your target range. You have frequent episodes of hypoglycemia. Get help right away if: You continue to have hypoglycemia symptoms after eating or drinking something that contains 15 grams of fast-acting carbohydrate, and you cannot get your blood glucose above 70 mg/dL (3.9 mmol/L) while following the 15:15 rule. Your blood glucose is below 54 mg/dL (3 mmol/L). You have a seizure. You faint. These symptoms may represent a serious problem that is an emergency. Do not wait to see if the symptoms will go  away. Get medical help right away. Call your local emergency services (911 in the [place] Do not drive yourself to the hospital. Summary Hypoglycemia occurs when the level of sugar (glucose) in the blood is too low. Hypoglycemia can happen in people who have or do not have diabetes. It can develop quickly, and it can be a medical emergency. Make sure you know the symptoms of hypoglycemia and how to treat it. Always have a fast-acting carbohydrate option with you to treat low blood sugar. This information is not intended to replace advice given to you by your health care provider. Make sure you discuss any questions you have with your health care provider. Document Revised: 11/04/2020 Document Reviewed: 11/04/2020 Elsevier Patient Education  2023 [employer].

## 2023-03-26 NOTE — Plan of Care (Signed)
Chronic Care Management Provider Comprehensive Care Plan    03/26/2023 Name: Mark Walls MRN: [id number] DOB: 04/16/1956  Referral to Chronic Care Management (CCM) services was placed by Provider:  Neale Burly FNP on Date: 03/05/23.  Chronic Condition 1: COPD Provider Assessment and Plan  Author: Oretha Milch, MD Author Type: Physician Filed: 08/04/2014 11:17 AM  Note Status: Written Cosign: Cosign Not Required Encounter Date: 08/04/2014  Problem: COPD (chronic obstructive pulmonary disease)  Editor: Oretha Milch, MD (Physician)              You do have moderate COPD from your smoking - lung capacity is at 50% Stay on symbicort & spiriva CXR today Consider Lung cancer screening in future       Expected Outcome/Goals Addressed This Visit (Provider CCM goals/Provider Assessment and plan  CCM (COPD) EXPECTED OUTCOME: MONITOR, SELF-MANAGE AND REDUCE SYMPTOMS OF COPD  Symptom Management Condition 1: Take medications as prescribed   Attend all scheduled provider appointments Call pharmacy for medication refills 3-7 days in advance of running out of medications Attend church or other social activities Perform all self care activities independently  Perform IADL's (shopping, preparing meals, housekeeping, managing finances) independently Call provider office for new concerns or questions  identify and remove indoor air pollutants listen for public air quality announcements every day do breathing exercises every day develop a rescue plan follow rescue plan if symptoms flare-up get at least 7 to 8 hours of sleep at night practice relaxation or meditation daily Look over education sent via My Chart- COPD action plan Call [facility] in [place] at [phone]  Chronic Condition 2: DIABETES Provider Assessment and Plan . Uncontrolled type 1 diabetes mellitus with hyperglycemia (HCC) Labs as below. Will communicate results to patient once available.  Ozempic increased  to 0.5mg  weekly. Per pt and wife tolerated 0.25mg  of Ozempic well.  Instructed pt and wife to stop taking previously prescribed glipizide.  Refills placed for lancets and blood glucose strips for patient to continue monitoring BG. Pt instructed to bring measurements to follow up appointment.  Referral placed for ophthalmology and podiatry for diabetic exams.  Referral placed for diabetic education for nutrition and medication education.  - Microalbumin / creatinine urine ratio - AMB Referral to Chronic Care Management Services - Ambulatory referral to diabetic education - Ambulatory referral to Ophthalmology - Ambulatory referral to Podiatry - Accu-Chek Softclix Lancets lancets; Test twice daily  Dispense: 100 each; Refill: 6 - glucose blood (ACCU-CHEK AVIVA PLUS) test strip; USE TO CHECK BLOOD SUGAR FOUR TIMES DAILY  Dispense: 150 strip; Refill: 4 - Semaglutide,0.25 or 0.5MG /DOS, (OZEMPIC, 0.25 OR 0.5 MG/DOSE,) 2 MG/1.5ML SOPN; Inject 0.5 mg into the skin once a week.  Dispense: 3 mL; Refill: 0   Expected Outcome/Goals Addressed This Visit (Provider CCM goals/Provider Assessment and plan  CCM (DIABETES) EXPECTED OUTCOME: MONITOR, SELF-MANAGE AND REDUCE SYMPTOMS OF DIABETES  Symptom Management Condition 2: Take medications as prescribed   Attend all scheduled provider appointments Call pharmacy for medication refills 3-7 days in advance of running out of medications Attend church or other social activities Perform all self care activities independently  Perform IADL's (shopping, preparing meals, housekeeping, managing finances) independently Call provider office for new concerns or questions  check blood sugar at prescribed times: once daily check feet daily for cuts, sores or redness enter blood sugar readings and medication or insulin into daily log take the blood sugar log to all doctor visits take the blood sugar meter  to all doctor visits trim toenails straight across fill half of  plate with vegetables limit fast food meals to no more than 1 per week manage portion size prepare main meal at home 3 to 5 days each week read food labels for fat, fiber, carbohydrates and portion size set a realistic goal keep feet up while sitting Look over education in My Chart- hypoglycemia Pharmacist to outreach you on 04/12/23 Take medications as prescribed   Attend all scheduled provider appointments Call pharmacy for medication refills 3-7 days in advance of running out of medications Attend church or other social activities Perform all self care activities independently  Perform IADL's (shopping, preparing meals, housekeeping, managing finances) independently Call provider office for new concerns or questions  identify and remove indoor air pollutants listen for public air quality announcements every day do breathing exercises every day develop a rescue plan follow rescue plan if symptoms flare-up get at least 7 to 8 hours of sleep at night practice relaxation or meditation daily Look over education sent via My Chart- COPD action plan Call [facility] in [place] at [phone]  Problem List Patient Active Problem List   Diagnosis Date Noted   Type 2 diabetes mellitus with diabetic neuropathy, with long-term current use of insulin 01/31/2023   Light headed 01/21/2020   Depression, major, single episode, severe 10/05/2019   Statin intolerance 11/18/2018   Cervical neck pain with evidence of disc disease 05/25/2017   Pain syndrome, chronic 12/28/2016   Atypical mole 12/28/2016   Allergic rhinitis 12/14/2015   History of digestive system disease 08/03/2015   Polypharmacy 03/30/2014   OSA (obstructive sleep apnea) 01/20/2014   Fatty liver 10/24/2013   GERD (gastroesophageal reflux disease) 08/11/2013   Radicular pain of lumbosacral region 07/07/2013   PAD (peripheral artery disease) (HCC) 09/03/2012   COPD (chronic obstructive pulmonary disease) (HCC) 08/11/2011    Chronic gouty arthropathy 02/08/2011   Loss of vision 09/21/2010   Memory loss 12/25/2008   Type 2 diabetes mellitus with hyperglycemia 05/22/2008   Hyperlipidemia LDL goal <100 05/22/2008   GAD (generalized anxiety disorder) 05/22/2008   ERECTILE DYSFUNCTION 05/22/2008   Hypertension associated with diabetes 05/22/2008   UNSPECIFIED PERIPHERAL VASCULAR DISEASE 05/22/2008   Chronic fatigue 05/22/2008    Medication Management  Current Outpatient Medications:    Accu-Chek Softclix Lancets lancets, Test twice daily, Disp: 100 each, Rfl: 6   diclofenac Sodium (VOLTAREN) 1 % GEL, SMARTSIG:Gram(s) Topical 4 Times Daily, Disp: 4 g, Rfl: 0   glucose blood (ACCU-CHEK AVIVA PLUS) test strip, USE TO CHECK BLOOD SUGAR FOUR TIMES DAILY, Disp: 150 strip, Rfl: 4   olmesartan (BENICAR) 20 MG tablet, Take 1 tablet (20 mg total) by mouth daily., Disp: 30 tablet, Rfl: 0   oxyCODONE-acetaminophen (PERCOCET) 10-325 MG tablet, Take 1 tablet by mouth every 6 (six) hours as needed., Disp: , Rfl:    Semaglutide,0.25 or 0.5MG /DOS, (OZEMPIC, 0.25 OR 0.5 MG/DOSE,) 2 MG/1.5ML SOPN, Inject 0.5 mg into the skin once a week., Disp: 3 mL, Rfl: 0   Vitamin D, Ergocalciferol, (DRISDOL) 1.25 MG (50000 UNIT) CAPS capsule, Take 1 capsule (50,000 Units total) by mouth every 7 (seven) days., Disp: 8 capsule, Rfl: 0   traMADol (ULTRAM) 50 MG tablet, Take 50 mg by mouth 3 (three) times daily as needed. (Patient not taking: Reported on 03/26/2023), Disp: , Rfl:   Cognitive Assessment Identity Confirmed: : Name; DOB Cognitive Status: Normal   Functional Assessment Hearing Difficulty or Deaf: no Wear Glasses or Blind:  no Concentrating, Remembering or Making Decisions Difficulty (CP): no Difficulty Communicating: no Difficulty Eating/Swallowing: no Walking or Climbing Stairs Difficulty: no Dressing/Bathing Difficulty: no Doing Errands Independently Difficulty (such as shopping) (CP): no   Caregiver Assessment  Primary  Source of Support/Comfort: friend Name of Support/Comfort Primary Source: patient did not share friend's name People in Home: alone   Planned Interventions  Provided patient with basic written and verbal COPD education on self care/management/and exacerbation prevention Advised patient to track and manage COPD triggers Provided instruction about proper use of medications used for management of COPD including inhalers Advised patient to self assesses COPD action plan zone and make appointment with provider if in the yellow zone for 48 hours without improvement Provided education about and advised patient to utilize infection prevention strategies to reduce risk of respiratory infection Discussed the importance of adequate rest and management of fatigue with COPD Screening for signs and symptoms of depression related to chronic disease state  Assessed social determinant of health barriers RN care manager gave information for ophthalmologist [facility] in [place] at [phone] and encouraged pt to schedule eye exam Provided education to patient about basic DM disease process; Reviewed medications with patient and discussed importance of medication adherence;        Reviewed prescribed diet with patient carbohydrate modified; Counseled on importance of regular laboratory monitoring as prescribed;        Discussed plans with patient for ongoing care management follow up and provided patient with direct contact information for care management team;      Provided patient with written educational materials related to hypo and hyperglycemia and importance of correct treatment;       Advised patient, providing education and rationale, to check cbg once daily and record        call provider for findings outside established parameters;       Review of patient status, including review of consultants reports, relevant laboratory and other test results, and medications completed;       Screening  for signs and symptoms of depression related to chronic disease state;        Assessed social determinant of health barriers;        Reviewed upcoming scheduled appointments  Interaction and coordination with outside resources, practitioners, and providers See CCM Referral  Care Plan: Available in MyChart

## 2023-03-26 NOTE — Chronic Care Management (AMB) (Signed)
Chronic Care Management   CCM RN Visit Note  03/26/2023 Name: Mark Walls MRN: [id number] DOB: 02/26/1956  Subjective: Mark Walls is a 67 y.o. year old male who is a primary care patient of Ellamae Sia, Aleen Campi, FNP. The patient was referred to the Chronic Care Management team for assistance with care management needs subsequent to provider initiation of CCM services and plan of care.    Today's Visit:  Engaged with patient by telephone for initial visit.     SDOH Interventions Today    Flowsheet Row Most Recent Value  SDOH Interventions   Food Insecurity Interventions Intervention Not Indicated  Housing Interventions Intervention Not Indicated  Transportation Interventions Intervention Not Indicated  Utilities Interventions Intervention Not Indicated  Financial Strain Interventions Intervention Not Indicated  Physical Activity Interventions Patient Refused  Stress Interventions Intervention Not Indicated  Social Connections Interventions Intervention Not Indicated         Goals Addressed             This Visit's Progress    CCM (COPD) EXPECTED OUTCOME: MONITOR, SELF-MANAGE AND REDUCE SYMPTOMS OF COPD       Current Barriers:  Knowledge Deficits related to COPD management Care Coordination needs related to medication management in a patient with COPD Chronic Disease Management support and education needs related to COPD, action plan No Advanced Directives in place- pt declines Patient reports he lives alone but has male friend that stays at his house most of time, pt reports he is overall independent with ADL, IADL's, uses a cane as needed Patient reports several referrals were placed at 03/06/23 primary care provider appointment- Referrals as follows: podiatry- pt states he has this appointment in place,  ophthalmology- pt states he has not heard and wants information so he can schedule diabetic eye checkup,  diabetes and nutrition education- pt states he is not  interested in this,  sleep study- pt states he had study in the past and not interested in following up any further, states "didn't like the machine"  Planned Interventions: Provided patient with basic written and verbal COPD education on self care/management/and exacerbation prevention Advised patient to track and manage COPD triggers Provided instruction about proper use of medications used for management of COPD including inhalers Advised patient to self assesses COPD action plan zone and make appointment with provider if in the yellow zone for 48 hours without improvement Provided education about and advised patient to utilize infection prevention strategies to reduce risk of respiratory infection Discussed the importance of adequate rest and management of fatigue with COPD Screening for signs and symptoms of depression related to chronic disease state  Assessed social determinant of health barriers RN care manager gave information for ophthalmologist [facility] in [place] at [phone] and encouraged pt to schedule eye exam  Symptom Management: Take medications as prescribed   Attend all scheduled provider appointments Call pharmacy for medication refills 3-7 days in advance of running out of medications Attend church or other social activities Perform all self care activities independently  Perform IADL's (shopping, preparing meals, housekeeping, managing finances) independently Call provider office for new concerns or questions  identify and remove indoor air pollutants listen for public air quality announcements every day do breathing exercises every day develop a rescue plan follow rescue plan if symptoms flare-up get at least 7 to 8 hours of sleep at night practice relaxation or meditation daily Look over education sent via My Chart- COPD action plan Call [facility] in  [place] at [phone]  Follow Up Plan: Telephone follow up appointment with care  management team member scheduled for:  06/12/23 at 130 pm       CCM (DIABETES) EXPECTED OUTCOME: MONITOR, SELF-MANAGE AND REDUCE SYMPTOMS OF DIABETES       Current Barriers:  Knowledge Deficits related to Diabetes management Chronic Disease Management support and education needs related to Diabetes and diet No Advanced Directives in place- pt declined Patient reports he checks CBG once daily fasting with ranges 140-150's range, pt not interested in Diabetes Education/ nutrition classes PharmD to outreach pt on 04/12/23  Planned Interventions: Provided education to patient about basic DM disease process; Reviewed medications with patient and discussed importance of medication adherence;        Reviewed prescribed diet with patient carbohydrate modified; Counseled on importance of regular laboratory monitoring as prescribed;        Discussed plans with patient for ongoing care management follow up and provided patient with direct contact information for care management team;      Provided patient with written educational materials related to hypo and hyperglycemia and importance of correct treatment;       Advised patient, providing education and rationale, to check cbg once daily and record        call provider for findings outside established parameters;       Review of patient status, including review of consultants reports, relevant laboratory and other test results, and medications completed;       Screening for signs and symptoms of depression related to chronic disease state;        Assessed social determinant of health barriers;        Reviewed upcoming scheduled appointments  Symptom Management: Take medications as prescribed   Attend all scheduled provider appointments Call pharmacy for medication refills 3-7 days in advance of running out of medications Attend church or other social activities Perform all self care activities independently  Perform IADL's (shopping, preparing  meals, housekeeping, managing finances) independently Call provider office for new concerns or questions  check blood sugar at prescribed times: once daily check feet daily for cuts, sores or redness enter blood sugar readings and medication or insulin into daily log take the blood sugar log to all doctor visits take the blood sugar meter to all doctor visits trim toenails straight across fill half of plate with vegetables limit fast food meals to no more than 1 per week manage portion size prepare main meal at home 3 to 5 days each week read food labels for fat, fiber, carbohydrates and portion size set a realistic goal keep feet up while sitting Look over education in My Chart- hypoglycemia Pharmacist to outreach you on 04/12/23  Follow Up Plan: Telephone follow up appointment with care management team member scheduled for:  06/12/23 at 130 pm          Plan:Telephone follow up appointment with care management team member scheduled for:  06/12/23 at 130 pm  Irving Shows [facility], BSN RN Case Manager [facility] [phone]

## 2023-04-03 ENCOUNTER — Ambulatory Visit: Payer: Medicare Other | Admitting: Family Medicine

## 2023-04-04 ENCOUNTER — Telehealth: Payer: Self-pay | Admitting: Family Medicine

## 2023-04-04 ENCOUNTER — Encounter: Payer: Self-pay | Admitting: Family Medicine

## 2023-04-04 NOTE — Telephone Encounter (Signed)
Called patient to schedule Medicare Annual Wellness Visit (AWV). Left message for patient to call back and schedule Medicare Annual Wellness Visit (AWV).  Last date of AWV: 07/06/2021   Please schedule an appointment at any time with either Laura or Courtney, NHA's. .  If any questions, please contact me at [phone].  Thank you,  Stephanie,  AMB Clinical Support [facility] AWV Program Direct Dial ??[phone]    

## 2023-04-05 ENCOUNTER — Ambulatory Visit: Payer: Medicare Other | Admitting: Podiatry

## 2023-04-12 ENCOUNTER — Telehealth: Payer: Medicare Other | Admitting: Pharmacist

## 2023-04-12 ENCOUNTER — Other Ambulatory Visit: Payer: Self-pay | Admitting: Family Medicine

## 2023-04-17 DIAGNOSIS — Z794 Long term (current) use of insulin: Secondary | ICD-10-CM

## 2023-04-17 DIAGNOSIS — E1159 Type 2 diabetes mellitus with other circulatory complications: Secondary | ICD-10-CM

## 2023-04-17 DIAGNOSIS — J449 Chronic obstructive pulmonary disease, unspecified: Secondary | ICD-10-CM | POA: Diagnosis not present

## 2023-04-19 ENCOUNTER — Ambulatory Visit: Payer: Medicare Other | Admitting: Podiatry

## 2023-04-26 ENCOUNTER — Ambulatory Visit (INDEPENDENT_AMBULATORY_CARE_PROVIDER_SITE_OTHER): Payer: Medicare Other | Admitting: Podiatry

## 2023-04-26 DIAGNOSIS — Z91199 Patient's noncompliance with other medical treatment and regimen due to unspecified reason: Secondary | ICD-10-CM

## 2023-04-26 NOTE — Progress Notes (Signed)
Pt was a no show for apt, [place] 

## 2023-05-04 ENCOUNTER — Ambulatory Visit (INDEPENDENT_AMBULATORY_CARE_PROVIDER_SITE_OTHER): Payer: Medicare Other | Admitting: Podiatry

## 2023-05-04 DIAGNOSIS — M79675 Pain in left toe(s): Secondary | ICD-10-CM

## 2023-05-04 DIAGNOSIS — E1140 Type 2 diabetes mellitus with diabetic neuropathy, unspecified: Secondary | ICD-10-CM | POA: Diagnosis not present

## 2023-05-04 DIAGNOSIS — M79674 Pain in right toe(s): Secondary | ICD-10-CM

## 2023-05-04 DIAGNOSIS — B351 Tinea unguium: Secondary | ICD-10-CM | POA: Diagnosis not present

## 2023-05-04 NOTE — Progress Notes (Signed)
Subjective:  Patient ID: Mark Walls, male    DOB: Jan 09, 1956,  MRN: [id number]   Mark Walls presents to clinic today for:  Chief Complaint  Patient presents with   Nail Problem    Diabetic Foot Care  PCP- Garth Schlatter, FNP Last Visit- a month ago  . Patient notes nails are thick, discolored, elongated and painful in shoegear when trying to ambulate.  Patient also notes he has chronic pain in both feet from standing all day.  He notes that he cannot find shoes that are quite comfortable.  PCP is Milian, Aleen Campi, FNP.  Allergies  Allergen Reactions   Allopurinol Nausea And Vomiting   Levofloxacin Nausea And Vomiting   Statins Other (See Comments)    Fatty liver, markedly elevated LFT   Sulfamethoxazole-Trimethoprim Nausea And Vomiting   Codeine Rash    Review of Systems: Negative except as noted in the HPI.  Objective:  There were no vitals filed for this visit.  Mark Walls is a pleasant 67 y.o. male in NAD. AAO x 3.  Vascular Examination: Capillary refill time is 3-5 seconds to toes bilateral. Palpable pedal pulses b/l LE. Digital hair present b/l. No pedal edema b/l. Skin temperature gradient WNL b/l. No varicosities b/l. No cyanosis or clubbing noted b/l.   Dermatological Examination: Pedal skin with normal turgor, texture and tone b/l. No open wounds. No interdigital macerations b/l. Toenails x10 are 3mm thick, discolored, dystrophic with subungual debris. There is pain with compression of the nail plates.  They are elongated x10  Neurological Examination: Protective sensation intact bilateral LE. Vibratory sensation diminished to bilateral forefoot  Musculoskeletal Examination: Muscle strength 5/5 to all LE muscle groups b/l.      Latest Ref Rng & Units 01/31/2023    4:00 PM  Hemoglobin A1C  Hemoglobin-A1c 4.8 - 5.6 % 8.5     Assessment/Plan: 1. Pain due to onychomycosis of toenails of both feet   2. Controlled type 2  diabetes with neuropathy (HCC)     The mycotic toenails were sharply debrided x10 with sterile nail nippers and a power debriding burr to decrease bulk/thickness and length.    Discussed the diabetic shoe program with the patient today.  He is very interested in proceeding with this as he has difficulty finding comfortable shoes.  Reviewed daily good habits to get into regarding his feet and inspection of his feet daily.  Instructed him on proper blood glucose control.  Return in about 3 months (around 08/04/2023) for RFC.   Clerance Lav, DPM, FACFAS [facility]     [address] [place], [place] [address]                Office [phone]  Fax [phone]

## 2023-06-05 ENCOUNTER — Encounter: Payer: Self-pay | Admitting: Orthopaedic Surgery

## 2023-06-05 ENCOUNTER — Other Ambulatory Visit (INDEPENDENT_AMBULATORY_CARE_PROVIDER_SITE_OTHER): Payer: Medicare Other

## 2023-06-05 ENCOUNTER — Ambulatory Visit (INDEPENDENT_AMBULATORY_CARE_PROVIDER_SITE_OTHER): Payer: Medicare Other | Admitting: Orthopaedic Surgery

## 2023-06-05 ENCOUNTER — Other Ambulatory Visit: Payer: Self-pay

## 2023-06-05 VITALS — BP 160/67 | HR 69 | Ht 67.0 in | Wt 189.4 lb

## 2023-06-05 DIAGNOSIS — M170 Bilateral primary osteoarthritis of knee: Secondary | ICD-10-CM | POA: Diagnosis not present

## 2023-06-05 DIAGNOSIS — G8929 Other chronic pain: Secondary | ICD-10-CM | POA: Diagnosis not present

## 2023-06-05 DIAGNOSIS — M25561 Pain in right knee: Secondary | ICD-10-CM

## 2023-06-05 DIAGNOSIS — M25562 Pain in left knee: Secondary | ICD-10-CM

## 2023-06-05 MED ORDER — OXYCODONE-ACETAMINOPHEN 5-325 MG PO TABS: ORAL_TABLET | ORAL | 0 refills | Status: DC

## 2023-06-05 NOTE — Progress Notes (Signed)
Subjective:    Patient ID: Mark Walls, male    DOB: 03/10/1956, 67 y.o.   MRN: [id number]  HPI He has chronic pain of both knees.  I saw him he says about 20 years ago and told him his knees were wearing out and he might need surgery in the future.  He says he has waited long enough and wants to be seen now.  He has chronic pain of both knees, more on the left.  He uses a cane.  He has swelling and popping. He has no giving way.  He uses a cane.  He has been on Percocet 10 for a while but is not on it now.  He has tried NSAIDs but they upset him.     Review of Systems  Constitutional:  Positive for activity change.  Musculoskeletal:  Positive for arthralgias, gait problem, joint swelling and myalgias.  All other systems reviewed and are negative. For Review of Systems, all other systems reviewed and are negative.  The following is a summary of the past history medically, past history surgically, known current medicines, social history and family history.  This information is gathered electronically by the computer from prior information and documentation.  I review this each visit and have found including this information at this point in the chart is beneficial and informative.   Past Medical History:  Diagnosis Date   Anxiety    Chronic fatigue    Depression    Diabetes mellitus, type 2 (HCC)    ED (erectile dysfunction)    Hyperlipidemia    Hypertension    Nicotine addiction    Obesity    PVD (peripheral vascular disease) (HCC)     Past Surgical History:  Procedure Laterality Date   CHOLECYSTECTOMY     COLONOSCOPY  10/01/2002   [name]:small internal hemorrhoids/single small sessile polyp in the rectum/ otherwise normal. Hyperplastic polyp.   COLONOSCOPY WITH ESOPHAGOGASTRODUODENOSCOPY (EGD) N/A 08/19/2013   QMV:HQIONGEX EG junction. Hiatal hernia. Abnormal gastric mucosa s/p bx: Internal hemorrhoids;  Otherwise, normal ileocolonoscopy - Status post segmental biopsy    ESOPHAGOGASTRODUODENOSCOPY  09/24/2002   [name]:gastritis of the antrum and the bodt of the stomach otherwise normal   left hand     left knee athroscopy  03/2008   polpectomy colon     right common iliac artery stenting     right elbow     ROTATOR CUFF REPAIR  09/01/08    Current Outpatient Medications on File Prior to Visit  Medication Sig Dispense Refill   Accu-Chek Softclix Lancets lancets Test twice daily 100 each 6   diclofenac Sodium (VOLTAREN) 1 % GEL SMARTSIG:Gram(s) Topical 4 Times Daily 4 g 0   glucose blood (ACCU-CHEK AVIVA PLUS) test strip USE TO CHECK BLOOD SUGAR FOUR TIMES DAILY 150 strip 4   olmesartan (BENICAR) 20 MG tablet Take 1 tablet (20 mg total) by mouth daily. (NEEDS TO BE SEEN BEFORE NEXT REFILL) 30 tablet 0   oxyCODONE-acetaminophen (PERCOCET) 10-325 MG tablet Take 1 tablet by mouth every 6 (six) hours as needed.     Semaglutide,0.25 or 0.5MG /DOS, (OZEMPIC, 0.25 OR 0.5 MG/DOSE,) 2 MG/1.5ML SOPN Inject 0.5 mg into the skin once a week. 3 mL 0   traMADol (ULTRAM) 50 MG tablet Take 50 mg by mouth 3 (three) times daily as needed.     Vitamin D, Ergocalciferol, (DRISDOL) 1.25 MG (50000 UNIT) CAPS capsule Take 1 capsule (50,000 Units total) by mouth every 7 (seven) days. 8 capsule  0   No current facility-administered medications on file prior to visit.    Social History   Socioeconomic History   Marital status: Married    Spouse name: Not on file   Number of children: 2   Years of education: Not on file   Highest education level: Not on file  Occupational History   Occupation: disabled   Tobacco Use   Smoking status: Former    Packs/day: 1.00    Years: 40.00    Additional pack years: 0.00    Total pack years: 40.00    Types: Cigarettes    Start date: 09/16/1970    Quit date: 11/17/2013    Years since quitting: 9.5   Smokeless tobacco: Never  Substance and Sexual Activity   Alcohol use: No    Alcohol/week: 0.0 standard drinks of alcohol   Drug use: No    Sexual activity: Yes    Partners: Female  Other Topics Concern   Not on file  Social History Narrative   Not on file   Social Determinants of Health   Financial Resource Strain: Low Risk  (03/26/2023)   Overall Financial Resource Strain (CARDIA)    Difficulty of Paying Living Expenses: Not hard at all  Food Insecurity: No Food Insecurity (03/26/2023)   Hunger Vital Sign    Worried About Running Out of Food in the Last Year: Never true    Ran Out of Food in the Last Year: Never true  Transportation Needs: No Transportation Needs (03/26/2023)   PRAPARE - Administrator, Civil Service (Medical): No    Lack of Transportation (Non-Medical): No  Physical Activity: Inactive (03/26/2023)   Exercise Vital Sign    Days of Exercise per Week: 0 days    Minutes of Exercise per Session: 0 min  Stress: No Stress Concern Present (03/26/2023)   [employer] of Occupational Health - Occupational Stress Questionnaire    Feeling of Stress : Not at all  Social Connections: Socially Isolated (03/26/2023)   Social Connection and Isolation Panel [NHANES]    Frequency of Communication with Friends and Family: More than three times a week    Frequency of Social Gatherings with Friends and Family: Twice a week    Attends Religious Services: Never    Database administrator or Organizations: No    Attends Banker Meetings: Never    Marital Status: Separated  Intimate Partner Violence: Not At Risk (03/26/2023)   Humiliation, Afraid, Rape, and Kick questionnaire    Fear of Current or Ex-Partner: No    Emotionally Abused: No    Physically Abused: No    Sexually Abused: No    Family History  Problem Relation Age of Onset   Cirrhosis Mother    Melanoma Mother    Mental illness Father        suicide    Mental illness Brother    Diabetes Brother    Hypertension Brother    Diabetes Brother    Hypertension Brother    Mental illness Brother    Melanoma Brother    Colon cancer Paternal  Grandfather        age 72    BP (!) 160/67   Pulse 69   Ht 5\' 7"  (1.702 m)   Wt 189 lb 6 oz (85.9 kg)   BMI 29.66 kg/m   Body mass index is 29.66 kg/m.      Objective:   Physical Exam Vitals and nursing note reviewed. Exam  conducted with a chaperone present.  Constitutional:      Appearance: He is well-developed.  HENT:     Head: Normocephalic and atraumatic.  Eyes:     Conjunctiva/sclera: Conjunctivae normal.     Pupils: Pupils are equal, round, and reactive to light.  Cardiovascular:     Rate and Rhythm: Normal rate and regular rhythm.  Pulmonary:     Effort: Pulmonary effort is normal.  Abdominal:     Palpations: Abdomen is soft.  Musculoskeletal:     Cervical back: Normal range of motion and neck supple.       Legs:  Skin:    General: Skin is warm and dry.  Neurological:     Mental Status: He is alert and oriented to person, place, and time.     Cranial Nerves: No cranial nerve deficit.     Motor: No abnormal muscle tone.     Coordination: Coordination normal.     Deep Tendon Reflexes: Reflexes are normal and symmetric. Reflexes normal.  Psychiatric:        Behavior: Behavior normal.        Thought Content: Thought content normal.        Judgment: Judgment normal.   X-rays were done of both knees, reported separately.        Assessment & Plan:   Encounter Diagnosis  Name Primary?   Chronic pain of both knees Yes   PROCEDURE NOTE:  The patient requests injections of the right knee , verbal consent was obtained.  The right knee was prepped appropriately after time out was performed.   Sterile technique was observed and injection of 1 cc of DepoMedrol 40mg  with several cc's of plain xylocaine. Anesthesia was provided by ethyl chloride and a 20-gauge needle was used to inject the knee area. The injection was tolerated well.  A band aid dressing was applied.  The patient was advised to apply ice later today and tomorrow to the injection sight as  needed.  PROCEDURE NOTE:  The patient requests injections of the left knee , verbal consent was obtained.  The left knee was prepped appropriately after time out was performed.   Sterile technique was observed and injection of 1 cc of DepoMedrol 40 mg with several cc's of plain xylocaine. Anesthesia was provided by ethyl chloride and a 20-gauge needle was used to inject the knee area. The injection was tolerated well.  A band aid dressing was applied.  The patient was advised to apply ice later today and tomorrow to the injection sight as needed.  I have reviewed the [place] Controlled Substance Reporting System web site prior to prescribing narcotic medicine for this patient.  Return in three weeks.  Call if any problem.  Precautions discussed.  Electronically Signed Darreld Mclean, MD 6/18/20242:39 PM

## 2023-06-08 ENCOUNTER — Other Ambulatory Visit: Payer: Self-pay | Admitting: Family Medicine

## 2023-06-08 DIAGNOSIS — E1165 Type 2 diabetes mellitus with hyperglycemia: Secondary | ICD-10-CM

## 2023-06-12 ENCOUNTER — Telehealth: Payer: Self-pay | Admitting: *Deleted

## 2023-06-12 ENCOUNTER — Telehealth: Payer: Medicare Other

## 2023-06-12 NOTE — Telephone Encounter (Signed)
CCM RN Visit Note   06/12/23 Name: Mark Walls MRN: [id number]      DOB: Jul 12, 1956  Subjective: Mark Walls is a 67 y.o. year old male who is a primary care patient of Neale Burly FNP, The patient was referred to the Chronic Care Management team for assistance with care management needs subsequent to provider initiation of CCM services and plan of care.      Telephone call to patient for follow up, patient and unidentified male answered the phone and state they are driving and cannot hear well, call dropped.  Plan:Telephone follow up appointment with care management team member scheduled for:  upon care guide rescheduling.  Irving Shows RNC, BSN RN Case Manager [facility] [phone]

## 2023-06-18 ENCOUNTER — Other Ambulatory Visit: Payer: Self-pay | Admitting: Family Medicine

## 2023-06-26 ENCOUNTER — Ambulatory Visit (HOSPITAL_COMMUNITY)
Admission: RE | Admit: 2023-06-26 | Discharge: 2023-06-26 | Disposition: A | Discharge status: Home / Self Care | Payer: Medicare Other | Source: Ambulatory Visit | Attending: Orthopaedic Surgery | Admitting: Orthopaedic Surgery

## 2023-06-26 ENCOUNTER — Encounter: Payer: Self-pay | Admitting: Orthopaedic Surgery

## 2023-06-26 ENCOUNTER — Ambulatory Visit (INDEPENDENT_AMBULATORY_CARE_PROVIDER_SITE_OTHER): Payer: Medicare Other | Admitting: Orthopaedic Surgery

## 2023-06-26 VITALS — BP 157/69 | HR 69 | Ht 67.0 in | Wt 189.0 lb

## 2023-06-26 DIAGNOSIS — M25562 Pain in left knee: Secondary | ICD-10-CM

## 2023-06-26 DIAGNOSIS — G8929 Other chronic pain: Secondary | ICD-10-CM | POA: Insufficient documentation

## 2023-06-26 DIAGNOSIS — M25561 Pain in right knee: Secondary | ICD-10-CM

## 2023-06-26 MED ORDER — METHYLPREDNISOLONE ACETATE 40 MG/ML IJ SUSP: 40.0000 mg | Freq: Once | INTRAMUSCULAR | Status: DC

## 2023-06-26 MED ORDER — OXYCODONE-ACETAMINOPHEN 5-325 MG PO TABS: ORAL_TABLET | ORAL | 0 refills | Status: DC

## 2023-06-26 MED ORDER — METHYLPREDNISOLONE ACETATE 40 MG/ML IJ SUSP
40.0000 mg | Freq: Once | INTRAMUSCULAR | Status: AC
  Administered 2023-06-26: 40 mg via INTRA_ARTICULAR

## 2023-06-26 NOTE — Addendum Note (Signed)
Addended by: Michaele Offer on: 06/26/2023 11:31 AM   Modules accepted: Orders

## 2023-06-26 NOTE — Progress Notes (Signed)
PROCEDURE NOTE:  The patient requests injections of the right knee , verbal consent was obtained.  The right knee was prepped appropriately after time out was performed.   Sterile technique was observed and injection of 1 cc of DepoMedrol 40mg  with several cc's of plain xylocaine. Anesthesia was provided by ethyl chloride and a 20-gauge needle was used to inject the knee area. The injection was tolerated well.  A band aid dressing was applied.  The patient was advised to apply ice later today and tomorrow to the injection sight as needed.  PROCEDURE NOTE:  The patient requests injections of the left knee , verbal consent was obtained.  The left knee was prepped appropriately after time out was performed.   Sterile technique was observed and injection of 1 cc of DepoMedrol 40 mg with several cc's of plain xylocaine. Anesthesia was provided by ethyl chloride and a 20-gauge needle was used to inject the knee area. The injection was tolerated well.  A band aid dressing was applied.  The patient was advised to apply ice later today and tomorrow to the injection sight as needed.  Encounter Diagnoses  Name Primary?   Chronic pain of left knee Yes   Chronic pain of right knee    I have reviewed the [place] Controlled Substance Reporting System web site prior to prescribing narcotic medicine for this patient.  Return in three weeks.  He has giving way of the left knee.  I will get MRI.  I am concerned about meniscus tear.  Call if any problem.  Precautions discussed.  Electronically Signed Darreld Mclean, MD 7/9/20249:58 AM

## 2023-06-26 NOTE — Patient Instructions (Signed)
Call central scheduling [phone] 

## 2023-06-27 ENCOUNTER — Ambulatory Visit (HOSPITAL_COMMUNITY): Payer: Medicare Other

## 2023-07-04 ENCOUNTER — Encounter: Payer: Self-pay | Admitting: Orthopaedic Surgery

## 2023-07-04 ENCOUNTER — Ambulatory Visit (INDEPENDENT_AMBULATORY_CARE_PROVIDER_SITE_OTHER): Payer: Medicare Other | Admitting: Orthopaedic Surgery

## 2023-07-04 VITALS — Ht 67.0 in | Wt 189.0 lb

## 2023-07-04 DIAGNOSIS — G8929 Other chronic pain: Secondary | ICD-10-CM

## 2023-07-04 DIAGNOSIS — M25562 Pain in left knee: Secondary | ICD-10-CM | POA: Diagnosis not present

## 2023-07-04 MED ORDER — OXYCODONE-ACETAMINOPHEN 5-325 MG PO TABS: ORAL_TABLET | ORAL | 0 refills | Status: DC

## 2023-07-04 NOTE — Progress Notes (Signed)
My knee hurts  He had MRI of the left knee showing: IMPRESSION: 1. Advanced medial tibiofemoral osteoarthritis with advanced degeneration of the posterior horn and body of the medial meniscus, complete loss of the articular cartilage and subchondral edema about the anterior aspect of the medial femoral condyle. 2. Moderate patellofemoral osteoarthritis with near complete loss of the articular cartilage and bulky marginal osteophytes. 3. Moderate-sized joint effusion with synovitis and intra-articular loose bodies. 4. Cruciate and collateral ligaments are intact. Quadriceps tendon and patellar tendon are also intact. No evidence of fracture or osteonecrosis.  I have explained the findings to him.  I have recommended a total knee.  He wants to think about it.  I have independently reviewed the MRI.    Left knee has effusion, pain medially, crepitus, ROM 0 to 105, limp left, NV intact, no distal edema.  Encounter Diagnosis  Name Primary?   Chronic pain of left knee Yes   I have reviewed the [place] Controlled Substance Reporting System web site prior to prescribing narcotic medicine for this patient.  Return in one month.  Call if any problem.  Precautions discussed.  Electronically Signed Darreld Mclean, MD 7/17/20248:40 AM

## 2023-07-17 ENCOUNTER — Ambulatory Visit: Payer: Medicare Other | Admitting: Orthopaedic Surgery

## 2023-07-19 ENCOUNTER — Telehealth: Payer: Self-pay | Admitting: Orthopaedic Surgery

## 2023-07-19 MED ORDER — OXYCODONE-ACETAMINOPHEN 5-325 MG PO TABS: ORAL_TABLET | ORAL | 0 refills | Status: DC

## 2023-07-19 NOTE — Telephone Encounter (Signed)
Dr. Sanjuan Dame pt - spoke w/the patient, he is requesting a refill on Oxycodone 5-325 to be sent to [facility] in [place]

## 2023-07-25 ENCOUNTER — Encounter: Payer: Self-pay | Admitting: Orthopaedic Surgery

## 2023-07-25 ENCOUNTER — Ambulatory Visit (INDEPENDENT_AMBULATORY_CARE_PROVIDER_SITE_OTHER): Payer: Medicare Other | Admitting: Orthopaedic Surgery

## 2023-07-25 VITALS — BP 145/71 | HR 73 | Ht 67.0 in | Wt 185.0 lb

## 2023-07-25 DIAGNOSIS — M25562 Pain in left knee: Secondary | ICD-10-CM

## 2023-07-25 DIAGNOSIS — G8929 Other chronic pain: Secondary | ICD-10-CM

## 2023-07-25 MED ORDER — OXYCODONE-ACETAMINOPHEN 7.5-325 MG PO TABS: 1.0000 | ORAL_TABLET | Freq: Four times a day (QID) | ORAL | 0 refills | Status: DC | PRN

## 2023-07-25 NOTE — Progress Notes (Signed)
PROCEDURE NOTE:  The patient requests injections of the left knee , verbal consent was obtained.  The left knee was prepped appropriately after time out was performed.   Sterile technique was observed and injection of 1 cc of DepoMedrol 40 mg with several cc's of plain xylocaine. Anesthesia was provided by ethyl chloride and a 20-gauge needle was used to inject the knee area. The injection was tolerated well.  A band aid dressing was applied.  The patient was advised to apply ice later today and tomorrow to the injection sight as needed.  Encounter Diagnosis  Name Primary?   Chronic pain of left knee Yes   I have reviewed the [place] Controlled Substance Reporting System web site prior to prescribing narcotic medicine for this patient.  Return in one month.  Call if any problem.  Precautions discussed.  Electronically Signed Darreld Mclean, MD 8/7/202410:16 AM

## 2023-07-31 ENCOUNTER — Other Ambulatory Visit: Payer: Medicare Other | Admitting: *Deleted

## 2023-07-31 ENCOUNTER — Encounter: Payer: Self-pay | Admitting: *Deleted

## 2023-07-31 ENCOUNTER — Ambulatory Visit: Payer: Medicare Other | Admitting: Orthopaedic Surgery

## 2023-07-31 NOTE — Patient Outreach (Signed)
Care Management   Visit Note  07/31/2023 Name: Mark Walls MRN: [id number] DOB: [birth year]  Subjective: Mark Walls is a 67 y.o. year old male who is a primary care patient of Ellamae Sia, Aleen Campi, FNP. The Care Management team was consulted for assistance.      Engaged with patient spoke with patient by telephone for follow up.   Goals Addressed             This Visit's Progress    CCM (COPD) EXPECTED OUTCOME: MONITOR, SELF-MANAGE AND REDUCE SYMPTOMS OF COPD       Current Barriers:  Knowledge Deficits related to COPD management Care Coordination needs related to medication management in a patient with COPD Chronic Disease Management support and education needs related to COPD, action plan No Advanced Directives in place- pt declines Patient reports he lives alone but has male friend that stays at his house most of time, pt reports he is overall independent with ADL, IADL's, uses a cane as needed Patient reports several referrals were placed at 03/06/23 primary care provider appointment- Referrals as follows: podiatry- pt states he has this appointment in place,  ophthalmology- pt states he has not heard and wants information so he can schedule diabetic eye checkup,  diabetes and nutrition education- pt states he is not interested in this,  sleep study- pt states he had study in the past and not interested in following up any further, states "didn't like the machine" Update- patient states he decided not to do the eye exam  Planned Interventions: Provided patient with basic written and verbal COPD education on self care/management/and exacerbation prevention Advised patient to track and manage COPD triggers Provided instruction about proper use of medications used for management of COPD including inhalers Advised patient to self assesses COPD action plan zone and make appointment with provider if in the yellow zone for 48 hours without improvement Provided education about  and advised patient to utilize infection prevention strategies to reduce risk of respiratory infection Discussed the importance of adequate rest and management of fatigue with COPD RN care manager previously gave information for ophthalmologist [facility] in [place] at [phone] and encouraged pt to schedule eye exam, encouraged pt to stay up to date on eye exam and scheduled an appointment  Symptom Management: Take medications as prescribed   Attend all scheduled provider appointments Call pharmacy for medication refills 3-7 days in advance of running out of medications Attend church or other social activities Perform all self care activities independently  Perform IADL's (shopping, preparing meals, housekeeping, managing finances) independently Call provider office for new concerns or questions  identify and remove indoor air pollutants listen for public air quality announcements every day do breathing exercises every day develop a rescue plan follow rescue plan if symptoms flare-up get at least 7 to 8 hours of sleep at night practice relaxation or meditation daily Follow COPD action plan Call [facility] in [place] at [phone]  Follow Up Plan:  10/17/23 at 130 pm       CCM (DIABETES) EXPECTED OUTCOME: MONITOR, SELF-MANAGE AND REDUCE SYMPTOMS OF DIABETES       Current Barriers:  Knowledge Deficits related to Diabetes management Chronic Disease Management support and education needs related to Diabetes and diet No Advanced Directives in place- pt declined Patient reports he checks CBG once daily fasting with ranges 130-140's range, pt not interested in Diabetes Education/ nutrition classes PharmD has been working with pt  Planned Interventions: Reviewed medications with  patient and discussed importance of medication adherence;        Counseled on importance of regular laboratory monitoring as prescribed;        Provided patient with written educational  materials related to hypo and hyperglycemia and importance of correct treatment;       Advised patient, providing education and rationale, to check cbg once daily and record        call provider for findings outside established parameters;       Review of patient status, including review of consultants reports, relevant laboratory and other test results, and medications completed;       Reviewed all upcoming scheduled appointments Reinforced importance of adherence to carbohydrate modified diet/ plate method  Symptom Management: Take medications as prescribed   Attend all scheduled provider appointments Call pharmacy for medication refills 3-7 days in advance of running out of medications Attend church or other social activities Perform all self care activities independently  Perform IADL's (shopping, preparing meals, housekeeping, managing finances) independently Call provider office for new concerns or questions  check blood sugar at prescribed times: once daily check feet daily for cuts, sores or redness enter blood sugar readings and medication or insulin into daily log take the blood sugar log to all doctor visits take the blood sugar meter to all doctor visits trim toenails straight across fill half of plate with vegetables limit fast food meals to no more than 1 per week manage portion size prepare main meal at home 3 to 5 days each week read food labels for fat, fiber, carbohydrates and portion size set a realistic goal keep feet up while sitting Please scheduled eye exam  Follow Up Plan:  10/17/23 at 130 pm           Plan: Telephone outreach scheduled for 10/17/23 at 130 pm  Irving Shows [facility], BSN [place]/ Ambulatory Care Management [phone]

## 2023-08-01 ENCOUNTER — Other Ambulatory Visit: Payer: Self-pay | Admitting: Family Medicine

## 2023-08-01 ENCOUNTER — Ambulatory Visit: Payer: Medicare Other | Admitting: Orthopaedic Surgery

## 2023-08-01 DIAGNOSIS — E1165 Type 2 diabetes mellitus with hyperglycemia: Secondary | ICD-10-CM

## 2023-08-03 ENCOUNTER — Ambulatory Visit: Payer: Medicare Other | Admitting: Family Medicine

## 2023-08-14 ENCOUNTER — Ambulatory Visit: Payer: Medicare Other | Admitting: Family Medicine

## 2023-08-15 ENCOUNTER — Ambulatory Visit (INDEPENDENT_AMBULATORY_CARE_PROVIDER_SITE_OTHER): Payer: Medicare Other | Admitting: Family Medicine

## 2023-08-15 ENCOUNTER — Encounter: Payer: Self-pay | Admitting: Family Medicine

## 2023-08-15 VITALS — BP 148/73 | HR 58 | Temp 98.5°F | Ht 67.0 in | Wt 188.0 lb

## 2023-08-15 DIAGNOSIS — E1159 Type 2 diabetes mellitus with other circulatory complications: Secondary | ICD-10-CM

## 2023-08-15 DIAGNOSIS — Z87891 Personal history of nicotine dependence: Secondary | ICD-10-CM

## 2023-08-15 DIAGNOSIS — E1165 Type 2 diabetes mellitus with hyperglycemia: Secondary | ICD-10-CM

## 2023-08-15 DIAGNOSIS — K219 Gastro-esophageal reflux disease without esophagitis: Secondary | ICD-10-CM | POA: Diagnosis not present

## 2023-08-15 DIAGNOSIS — Z7985 Long-term (current) use of injectable non-insulin antidiabetic drugs: Secondary | ICD-10-CM | POA: Diagnosis not present

## 2023-08-15 DIAGNOSIS — Z794 Long term (current) use of insulin: Secondary | ICD-10-CM

## 2023-08-15 DIAGNOSIS — G894 Chronic pain syndrome: Secondary | ICD-10-CM

## 2023-08-15 DIAGNOSIS — E1169 Type 2 diabetes mellitus with other specified complication: Secondary | ICD-10-CM

## 2023-08-15 DIAGNOSIS — I152 Hypertension secondary to endocrine disorders: Secondary | ICD-10-CM

## 2023-08-15 DIAGNOSIS — E559 Vitamin D deficiency, unspecified: Secondary | ICD-10-CM

## 2023-08-15 DIAGNOSIS — E785 Hyperlipidemia, unspecified: Secondary | ICD-10-CM

## 2023-08-15 LAB — BAYER DCA HB A1C WAIVED: HB A1C (BAYER DCA - WAIVED): 7.6 % — ABNORMAL HIGH (ref 4.8–5.6)

## 2023-08-15 MED ORDER — OLMESARTAN MEDOXOMIL 20 MG PO TABS
20.0000 mg | ORAL_TABLET | Freq: Every day | ORAL | 0 refills | Status: AC
Start: 2023-08-15 — End: ?

## 2023-08-15 MED ORDER — OZEMPIC (0.25 OR 0.5 MG/DOSE) 2 MG/1.5ML ~~LOC~~ SOPN
0.2500 mg | PEN_INJECTOR | SUBCUTANEOUS | 0 refills | Status: DC
Start: 2023-08-15 — End: 2023-10-25

## 2023-08-15 MED ORDER — EZETIMIBE 10 MG PO TABS: 10.0000 mg | ORAL_TABLET | Freq: Every day | ORAL | 0 refills | Status: AC

## 2023-08-15 NOTE — Patient Instructions (Addendum)
Reschedule for eye exam  [facility] [address], [place] [address] [phone]  Bring BP and blood sugar log to follow up.   Podiatry/Foot  [facility] [place]. [place], [place] [address] [phone]

## 2023-08-15 NOTE — Progress Notes (Signed)
Subjective:  Patient ID: Mark Walls, male    DOB: Nov 28, 1956, 67 y.o.   MRN: [id number]  Patient Care Team: Arrie Senate, FNP as PCP - General (Family Medicine) Audrie Gallus, RN as [facility] Care Management   Chief Complaint:  Medical Management of Chronic Issues (Needs medication refills)  HPI: Mark Walls is a 67 y.o. male presenting on 08/15/2023 for Medical Management of Chronic Issues (Needs medication refills)  HPI 1. Type 2 diabetes mellitus with hyperglycemia, with long-term current use of insulin (HCC) Glucometer:Accucheck.   High at home: 270; Low at home: none, Taking medication(s): Patient has been out of medications because he was out of town and did not get his refills. Patient stated that he was out of ozempic, so he started taking oral pill that he was on previously. He does not know the name of it. Believed it started with a B.   Last eye exam: ordered 03/06/2023 - needs new referral  Last foot exam: referral placed to podiatry 03/06/2023 Last A1c:  Lab Results  Component Value Date   HGBA1C 8.5 (H) 01/31/2023   Nephropathy screen indicated?: due on 02/2024 Last flu, zoster and/or pneumovax:  Immunization History  Administered Date(s) Administered   Influenza Split 09/03/2012   Influenza Whole 09/16/2008, 10/29/2009   Influenza,inj,Quad PF,6+ Mos 09/18/2013, 10/28/2014, 12/14/2015, 09/05/2016, 10/17/2017, 10/30/2018, 09/30/2019   Pneumococcal Conjugate-13 01/27/2015   Pneumococcal Polysaccharide-23 10/29/2009, 09/30/2019   Tdap 09/05/2016    ROS: Denies dizziness, LOC, polyuria, polydipsia, unintended weight loss, foot ulcerations, numbness or tingling in extremities, shortness of breath or chest pain. 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  Endorses knee pain. Endorses numbness and tingling in hands and feet "every now and then"  Happens every day. Describes it as a "needle" in his hand or  elbow.   Vitamin D deficiency Reports that he is taking vitamin D supplementation. Reports that he has been taking once weekly. Denies any side effects. Reports that he feels increasingly fatigued and like he is gaining weight   Hypertension associated with diabetes (HCC) Has BP monitor at home Yes BP at home average 150/70 ROS Denies anxiety, fatigue, peripheral edema, changes to vision, chest pain, headaches, palpitations, sweats, SOB, PND, orthopnea Meds previously on Benicar, is unsure if he is out of it or not.  CAD risks Diabetes Mellitus, hypertension, hypercholesterolemia/hyperlipidemia, COPD  GERD  Feels that this is doing well. Takes medication once weekly. Believes he is taking Tums. Denies hematochezia, hematemesis.   Tobacco  Quit 6 months  Smoked 1 cigarette per day for the last year.  Started smoking at 67 years old.  3-4 PPD for ~60 years.   Relevant past medical, surgical, family, and social history reviewed and updated as indicated.  Allergies and medications reviewed and updated. Data reviewed: Chart in Epic.  Past Medical History:  Diagnosis Date   Anxiety    Chronic fatigue    Depression    Diabetes mellitus, type 2 (HCC)    ED (erectile dysfunction)    Hyperlipidemia    Hypertension    Nicotine addiction    Obesity    PVD (peripheral vascular disease) (HCC)     Past Surgical History:  Procedure Laterality Date   CHOLECYSTECTOMY     COLONOSCOPY  10/01/2002   [name]:small internal hemorrhoids/single small sessile polyp in the rectum/ otherwise normal. Hyperplastic polyp.   COLONOSCOPY WITH ESOPHAGOGASTRODUODENOSCOPY (EGD) N/A 08/19/2013   GEX:[id number]  EG junction. Hiatal hernia. Abnormal gastric mucosa s/p bx: Internal hemorrhoids;  Otherwise, normal ileocolonoscopy - Status post segmental biopsy   ESOPHAGOGASTRODUODENOSCOPY  09/24/2002   [name]:gastritis of the antrum and the bodt of the stomach otherwise normal   left hand     left knee athroscopy   03/2008   polpectomy colon     right common iliac artery stenting     right elbow     ROTATOR CUFF REPAIR  09/01/08    Social History   Socioeconomic History   Marital status: Married    Spouse name: Not on file   Number of children: 2   Years of education: Not on file   Highest education level: Not on file  Occupational History   Occupation: disabled   Tobacco Use   Smoking status: Former    Current packs/day: 0.00    Average packs/day: 1 pack/day for 43.2 years (43.2 ttl pk-yrs)    Types: Cigarettes    Start date: 09/16/1970    Quit date: 11/17/2013    Years since quitting: 9.7   Smokeless tobacco: Never  Substance and Sexual Activity   Alcohol use: No    Alcohol/week: 0.0 standard drinks of alcohol   Drug use: No   Sexual activity: Yes    Partners: Female  Other Topics Concern   Not on file  Social History Narrative   Not on file   Social Determinants of Health   Financial Resource Strain: Low Risk  (03/26/2023)   Overall Financial Resource Strain (CARDIA)    Difficulty of Paying Living Expenses: Not hard at all  Food Insecurity: No Food Insecurity (03/26/2023)   Hunger Vital Sign    Worried About Running Out of Food in the Last Year: Never true    Ran Out of Food in the Last Year: Never true  Transportation Needs: No Transportation Needs (03/26/2023)   PRAPARE - Administrator, Civil Service (Medical): No    Lack of Transportation (Non-Medical): No  Physical Activity: Inactive (03/26/2023)   Exercise Vital Sign    Days of Exercise per Week: 0 days    Minutes of Exercise per Session: 0 min  Stress: No Stress Concern Present (03/26/2023)   [employer] of Occupational Health - Occupational Stress Questionnaire    Feeling of Stress : Not at all  Social Connections: Socially Isolated (03/26/2023)   Social Connection and Isolation Panel [NHANES]    Frequency of Communication with Friends and Family: More than three times a week    Frequency of Social  Gatherings with Friends and Family: Twice a week    Attends Religious Services: Never    Database administrator or Organizations: No    Attends Banker Meetings: Never    Marital Status: Separated  Intimate Partner Violence: Not At Risk (03/26/2023)   Humiliation, Afraid, Rape, and Kick questionnaire    Fear of Current or Ex-Partner: No    Emotionally Abused: No    Physically Abused: No    Sexually Abused: No    Outpatient Encounter Medications as of 08/15/2023  Medication Sig   Accu-Chek Softclix Lancets lancets Test twice daily   diclofenac Sodium (VOLTAREN) 1 % GEL SMARTSIG:Gram(s) Topical 4 Times Daily   glucose blood (ACCU-CHEK AVIVA PLUS) test strip USE TO CHECK BLOOD SUGAR FOUR TIMES DAILY   olmesartan (BENICAR) 20 MG tablet TAKE 1 TABLET(20 MG) BY MOUTH DAILY   oxyCODONE-acetaminophen (PERCOCET) 7.5-325 MG tablet Take 1 tablet by mouth every 6 (  six) hours as needed for moderate pain or severe pain (Must last 30 days.).   Semaglutide,0.25 or 0.5MG /DOS, (OZEMPIC, 0.25 OR 0.5 MG/DOSE,) 2 MG/1.5ML SOPN Inject 0.5 mg into the skin once a week.   Vitamin D, Ergocalciferol, (DRISDOL) 1.25 MG (50000 UNIT) CAPS capsule Take 1 capsule (50,000 Units total) by mouth every 7 (seven) days.   [DISCONTINUED] traMADol (ULTRAM) 50 MG tablet Take 50 mg by mouth 3 (three) times daily as needed.   No facility-administered encounter medications on file as of 08/15/2023.    Allergies  Allergen Reactions   Allopurinol Nausea And Vomiting   Levofloxacin Nausea And Vomiting   Statins Other (See Comments)    Fatty liver, markedly elevated LFT   Sulfamethoxazole-Trimethoprim Nausea And Vomiting   Codeine Rash   Review of Systems As Per HPI  Objective:  BP (!) 148/73   Pulse (!) 58   Temp 98.5 F (36.9 C)   Ht 5\' 7"  (1.702 m)   Wt 188 lb (85.3 kg)   SpO2 95%   BMI 29.44 kg/m    Wt Readings from Last 3 Encounters:  07/25/23 185 lb (83.9 kg)  07/04/23 189 lb (85.7 kg)  06/26/23  189 lb (85.7 kg)   Physical Exam Constitutional:      General: He is awake. He is not in acute distress.    Appearance: Normal appearance. He is well-developed and well-groomed. He is not ill-appearing, toxic-appearing or diaphoretic.  Cardiovascular:     Rate and Rhythm: Normal rate.     Pulses: Normal pulses.          Radial pulses are 2+ on the right side and 2+ on the left side.       Posterior tibial pulses are 2+ on the right side and 2+ on the left side.     Heart sounds: Normal heart sounds. No murmur heard.    No gallop.  Pulmonary:     Effort: Pulmonary effort is normal. No respiratory distress.     Breath sounds: Normal breath sounds. No stridor. No wheezing, rhonchi or rales.  Musculoskeletal:     Cervical back: Full passive range of motion without pain and neck supple.     Right lower leg: No edema.     Left lower leg: No edema.     Right foot: Normal range of motion. No deformity, bunion, Charcot foot, foot drop or prominent metatarsal heads.     Left foot: Normal range of motion. No deformity, bunion, Charcot foot, foot drop or prominent metatarsal heads.  Feet:     Right foot:     Protective Sensation: 10 sites tested.  10 sites sensed.     Skin integrity: Callus present. No ulcer, skin breakdown, erythema or warmth.     Toenail Condition: Right toenails are normal.     Left foot:     Protective Sensation: 10 sites tested.  9 sites sensed.     Toenail Condition: Left toenails are normal.  Skin:    General: Skin is warm.     Capillary Refill: Capillary refill takes less than 2 seconds.  Neurological:     General: No focal deficit present.     Mental Status: He is alert, oriented to person, place, and time and easily aroused. Mental status is at baseline.     GCS: GCS eye subscore is 4. GCS verbal subscore is 5. GCS motor subscore is 6.     Motor: No weakness.  Psychiatric:  Attention and Perception: Attention and perception normal.        Mood and Affect:  Mood and affect normal.        Speech: Speech normal.        Behavior: Behavior normal. Behavior is cooperative.        Thought Content: Thought content normal. Thought content does not include homicidal or suicidal ideation. Thought content does not include homicidal or suicidal plan.        Cognition and Memory: Cognition and memory normal.        Judgment: Judgment normal.     Results for orders placed or performed in visit on 03/06/23  Microalbumin / creatinine urine ratio  Result Value Ref Range   Creatinine, Urine 95.4 Not Estab. mg/dL   Microalbumin, Urine 16.1 Not Estab. ug/mL   Microalb/Creat Ratio 12 0 - 29 mg/g creat  Lipid Panel  Result Value Ref Range   Cholesterol, Total 192 100 - 199 mg/dL   Triglycerides 096 0 - 149 mg/dL   HDL 34 (L) >04 mg/dL   VLDL Cholesterol Cal 26 5 - 40 mg/dL   LDL Chol Calc (NIH) 540 (H) 0 - 99 mg/dL   Chol/HDL Ratio 5.6 (H) 0.0 - 5.0 ratio          03/26/2023    1:04 PM 03/06/2023    4:09 PM 01/31/2023    3:22 PM 03/03/2022    2:34 PM 07/06/2021    2:29 PM  Depression screen PHQ 2/9  Decreased Interest 0 0 0 1 0  Down, Depressed, Hopeless 0 0 0 0 1  PHQ - 2 Score 0 0 0 1 1  Altered sleeping  1 0  0  Tired, decreased energy  3 3  0  Change in appetite  1 0  0  Feeling bad or failure about yourself   0 0  0  Trouble concentrating  0 0  0  Moving slowly or fidgety/restless  0 0  0  Suicidal thoughts  0 0  0  PHQ-9 Score  5 3  1   Difficult doing work/chores   Somewhat difficult  Not difficult at all       03/06/2023    4:10 PM 01/31/2023    3:23 PM  GAD 7 : Generalized Anxiety Score  Nervous, Anxious, on Edge 0 0  Control/stop worrying 0 0  Worry too much - different things 0 1  Trouble relaxing 1 0  Restless 0 0  Easily annoyed or irritable 0 0  Afraid - awful might happen 0 0  Total GAD 7 Score 1 1  Anxiety Difficulty Not difficult at all Not difficult at all    The 10-year ASCVD risk score (Arnett DK, et al., 2019) is:  45.3%   Values used to calculate the score:     Age: 52 years     Sex: Male     Is Non-Hispanic African American: No     Diabetic: Yes     Tobacco smoker: No     Systolic Blood Pressure: 148 mmHg     Is BP treated: Yes     HDL Cholesterol: 34 mg/dL     Total Cholesterol: 192 mg/dL  Pertinent labs & imaging results that were available during my care of the patient were reviewed by me and considered in my medical decision making.  Assessment & Plan:  Mark Walls was seen today for medical management of chronic issues.  Diagnoses and all orders for this visit:  Type 2 diabetes mellitus with hyperglycemia, with long-term current use of insulin (HCC) A1C 7.7% in office today. Discussed results with patient. Will restart ozempic as below.  Labs as below. Will communicate results to patient once available. Will await results to determine next steps. Discussed with patient that he may have side effects with restarting ozempic.  -     CBC with Differential/Platelet -     CMP14+EGFR -     VITAMIN D 25 Hydroxy (Vit-D Deficiency, Fractures) -     Vitamin B12 -     TSH -     Bayer DCA Hb A1c Waived -     Semaglutide,0.25 or 0.5MG /DOS, (OZEMPIC, 0.25 OR 0.5 MG/DOSE,) 2 MG/1.5ML SOPN; Inject 0.25 mg into the skin once a week.  Long-term current use of injectable noninsulin antidiabetic medication Refill as below.  -     Semaglutide,0.25 or 0.5MG /DOS, (OZEMPIC, 0.25 OR 0.5 MG/DOSE,) 2 MG/1.5ML SOPN; Inject 0.25 mg into the skin once a week.  Hypertension associated with diabetes (HCC) Not at goal in office. Unsure if patient is taking medication as prescribed. Provided patient with log to monitor BP and BG at home.  -     olmesartan (BENICAR) 20 MG tablet; Take 1 tablet (20 mg total) by mouth daily.  Gastroesophageal reflux disease, unspecified whether esophagitis present Well controlled. Asked patient to bring in medications that he is taking at home to follow up appt.  Patient completed EGD and  Colonoscopy in 2014. Reviewed results from 08/20/23 colonoscopy and upper endoscopy.   Pain syndrome, chronic Patient to follow up with pain management.   Hyperlipidemia associated with type 2 diabetes mellitus (HCC) Patient unable to take statins due to liver enzyme elevations.  Reviewed ASCVD risk score from previous. Will start zetia as below.  -     ezetimibe (ZETIA) 10 MG tablet; Take 1 tablet (10 mg total) by mouth daily. -     Lipid panel  History of tobacco use in past year Praised patient on quitting tobacco.  Referral placed as below.  -     Ambulatory Referral Lung Cancer Screening [facility]  Vitamin D deficiency Labs as below. Will communicate results to patient once available. Will await results to determine next steps. Did not refill medications at this time.  -     VITAMIN D 25 Hydroxy (Vit-D Deficiency, Fractures)  Continue all other maintenance medications.  Follow up plan: Return in about 4 weeks (around 09/12/2023) for Chronic Condition Follow up.  Continue healthy lifestyle choices, including diet (rich in fruits, vegetables, and lean proteins, and low in salt and simple carbohydrates) and exercise (at least 30 minutes of moderate physical activity daily).  Written and verbal instructions provided   The above assessment and management plan was discussed with the patient. The patient verbalized understanding of and has agreed to the management plan. Patient is aware to call the clinic if they develop any new symptoms or if symptoms persist or worsen. Patient is aware when to return to the clinic for a follow-up visit. Patient educated on when it is appropriate to go to the emergency department.   Neale Burly, DNP-FNP [facility] [address] [place], [place] [address] [phone]

## 2023-08-16 ENCOUNTER — Encounter: Payer: Self-pay | Admitting: *Deleted

## 2023-08-16 LAB — CBC WITH DIFFERENTIAL/PLATELET
Basophils Absolute: 0.1 10*3/uL (ref 0.0–0.2)
Basos: 1 %
EOS (ABSOLUTE): 0.2 10*3/uL (ref 0.0–0.4)
Eos: 2 %
Hematocrit: 45.3 % (ref 37.5–51.0)
Hemoglobin: 15.5 g/dL (ref 13.0–17.7)
Immature Grans (Abs): 0.1 10*3/uL (ref 0.0–0.1)
Immature Granulocytes: 1 %
Lymphocytes Absolute: 2.2 10*3/uL (ref 0.7–3.1)
Lymphs: 23 %
MCH: 31.7 pg (ref 26.6–33.0)
MCHC: 34.2 g/dL (ref 31.5–35.7)
MCV: 93 fL (ref 79–97)
Monocytes Absolute: 0.5 10*3/uL (ref 0.1–0.9)
Monocytes: 5 %
Neutrophils Absolute: 6.8 10*3/uL (ref 1.4–7.0)
Neutrophils: 68 %
Platelets: 209 10*3/uL (ref 150–450)
RBC: 4.89 x10E6/uL (ref 4.14–5.80)
RDW: 13.1 % (ref 11.6–15.4)
WBC: 9.8 10*3/uL (ref 3.4–10.8)

## 2023-08-16 LAB — CMP14+EGFR
ALT: 46 IU/L — ABNORMAL HIGH (ref 0–44)
AST: 22 IU/L (ref 0–40)
Albumin: 4.2 g/dL (ref 3.9–4.9)
Alkaline Phosphatase: 86 IU/L (ref 44–121)
BUN/Creatinine Ratio: 20 (ref 10–24)
BUN: 18 mg/dL (ref 8–27)
Bilirubin Total: 0.6 mg/dL (ref 0.0–1.2)
CO2: 22 mmol/L (ref 20–29)
Calcium: 9.8 mg/dL (ref 8.6–10.2)
Chloride: 106 mmol/L (ref 96–106)
Creatinine, Ser: 0.88 mg/dL (ref 0.76–1.27)
Globulin, Total: 2.7 g/dL (ref 1.5–4.5)
Glucose: 159 mg/dL — ABNORMAL HIGH (ref 70–99)
Potassium: 4.4 mmol/L (ref 3.5–5.2)
Sodium: 142 mmol/L (ref 134–144)
Total Protein: 6.9 g/dL (ref 6.0–8.5)
eGFR: 94 mL/min/{1.73_m2} (ref >59)

## 2023-08-16 LAB — VITAMIN D 25 HYDROXY (VIT D DEFICIENCY, FRACTURES): Vit D, 25-Hydroxy: 30 ng/mL (ref 30.0–100.0)

## 2023-08-16 LAB — LIPID PANEL
Chol/HDL Ratio: 7.2 ratio — ABNORMAL HIGH (ref 0.0–5.0)
Cholesterol, Total: 253 mg/dL — ABNORMAL HIGH (ref 100–199)
HDL: 35 mg/dL — ABNORMAL LOW (ref >39)
LDL Chol Calc (NIH): 182 mg/dL — ABNORMAL HIGH (ref 0–99)
Triglycerides: 191 mg/dL — ABNORMAL HIGH (ref 0–149)
VLDL Cholesterol Cal: 36 mg/dL (ref 5–40)

## 2023-08-16 LAB — TSH: TSH: 1.01 u[IU]/mL (ref 0.450–4.500)

## 2023-08-16 LAB — VITAMIN B12: Vitamin B-12: 566 pg/mL (ref 232–1245)

## 2023-08-16 NOTE — Progress Notes (Signed)
Elevated BG, consistent with A1C. A1C discussed in visit. Improving, patient to continue ozempic. Slightly elevated ALT. Cholesterol is elevated. Diet encouraged - increase intake of fresh fruits and vegetables, increase intake of lean proteins. Bake, broil, or grill foods. Avoid fried, greasy, and fatty foods. Avoid fast foods. Increase intake of fiber-rich whole grains. Exercise encouraged - at least 150 minutes per week and advance as tolerated. Started zetia, please take consistently and we will recheck in 3 months. Patient has a very high ASCVD risk score. All other labs normal.   The 10-year ASCVD risk score (Arnett DK, et al., 2019) is: 50.5%   Values used to calculate the score:     Age: 67 years     Sex: Male     Is Non-Hispanic African American: No     Diabetic: Yes     Tobacco smoker: No     Systolic Blood Pressure: 148 mmHg     Is BP treated: Yes     HDL Cholesterol: 35 mg/dL     Total Cholesterol: 253 mg/dL

## 2023-08-21 ENCOUNTER — Ambulatory Visit (INDEPENDENT_AMBULATORY_CARE_PROVIDER_SITE_OTHER): Payer: Medicare Other | Admitting: Orthopaedic Surgery

## 2023-08-21 ENCOUNTER — Encounter: Payer: Self-pay | Admitting: Orthopaedic Surgery

## 2023-08-21 ENCOUNTER — Encounter: Payer: Self-pay | Admitting: Family Medicine

## 2023-08-21 DIAGNOSIS — G8929 Other chronic pain: Secondary | ICD-10-CM | POA: Diagnosis not present

## 2023-08-21 DIAGNOSIS — M25562 Pain in left knee: Secondary | ICD-10-CM | POA: Diagnosis not present

## 2023-08-21 MED ORDER — METHYLPREDNISOLONE ACETATE 40 MG/ML IJ SUSP
40.0000 mg | Freq: Once | INTRAMUSCULAR | Status: AC
Start: 2023-08-21 — End: 2023-08-21
  Administered 2023-08-21: 40 mg via INTRA_ARTICULAR

## 2023-08-21 MED ORDER — OXYCODONE-ACETAMINOPHEN 7.5-325 MG PO TABS: 1.0000 | ORAL_TABLET | Freq: Four times a day (QID) | ORAL | 0 refills | Status: DC | PRN

## 2023-08-21 NOTE — Progress Notes (Signed)
PROCEDURE NOTE:  The patient requests injections of the left knee , verbal consent was obtained.  The left knee was prepped appropriately after time out was performed.   Sterile technique was observed and injection of 1 cc of DepoMedrol 40 mg with several cc's of plain xylocaine. Anesthesia was provided by ethyl chloride and a 20-gauge needle was used to inject the knee area. The injection was tolerated well.  A band aid dressing was applied.  The patient was advised to apply ice later today and tomorrow to the injection sight as needed.  Encounter Diagnosis  Name Primary?   Chronic pain of left knee Yes   Return in one month.  I have reviewed the [place] Controlled Substance Reporting System web site prior to prescribing narcotic medicine for this patient.  Call if any problem.  Precautions discussed.  Electronically Signed Darreld Mclean, MD 9/3/20248:54 AM

## 2023-08-21 NOTE — Addendum Note (Signed)
Addended by: Michaele Offer on: 08/21/2023 09:22 AM   Modules accepted: Orders

## 2023-08-22 ENCOUNTER — Ambulatory Visit: Payer: Medicare Other | Admitting: Orthopaedic Surgery

## 2023-08-27 ENCOUNTER — Encounter: Payer: Medicare Other | Admitting: Podiatry

## 2023-08-27 NOTE — Progress Notes (Signed)
Patient was a no show for his scheduled appt. Today.

## 2023-09-14 ENCOUNTER — Ambulatory Visit: Payer: Medicare Other | Admitting: Family Medicine

## 2023-09-18 ENCOUNTER — Ambulatory Visit (INDEPENDENT_AMBULATORY_CARE_PROVIDER_SITE_OTHER): Payer: Medicare Other | Admitting: Orthopaedic Surgery

## 2023-09-18 ENCOUNTER — Encounter: Payer: Self-pay | Admitting: Orthopaedic Surgery

## 2023-09-18 ENCOUNTER — Ambulatory Visit: Payer: Medicare Other | Admitting: Family Medicine

## 2023-09-18 DIAGNOSIS — M25562 Pain in left knee: Secondary | ICD-10-CM | POA: Diagnosis not present

## 2023-09-18 DIAGNOSIS — G8929 Other chronic pain: Secondary | ICD-10-CM

## 2023-09-18 MED ORDER — OXYCODONE-ACETAMINOPHEN 7.5-325 MG PO TABS: 1.0000 | ORAL_TABLET | Freq: Four times a day (QID) | ORAL | 0 refills | Status: DC | PRN

## 2023-09-18 MED ORDER — METHYLPREDNISOLONE ACETATE 40 MG/ML IJ SUSP
40.0000 mg | Freq: Once | INTRAMUSCULAR | Status: AC
Start: 2023-09-18 — End: 2023-09-18
  Administered 2023-09-18: 40 mg via INTRA_ARTICULAR

## 2023-09-18 NOTE — Progress Notes (Signed)
PROCEDURE NOTE:  The patient requests injections of the left knee , verbal consent was obtained.  The left knee was prepped appropriately after time out was performed.   Sterile technique was observed and injection of 1 cc of DepoMedrol 40 mg with several cc's of plain xylocaine. Anesthesia was provided by ethyl chloride and a 20-gauge needle was used to inject the knee area. The injection was tolerated well.  A band aid dressing was applied.  The patient was advised to apply ice later today and tomorrow to the injection sight as needed.  Encounter Diagnosis  Name Primary?   Chronic pain of left knee Yes   I have reviewed the [place] Controlled Substance Reporting System web site prior to prescribing narcotic medicine for this patient.  Call if any problem.  Precautions discussed.  Return in one month.  Electronically Signed Darreld Mclean, MD 10/1/20249:41 AM

## 2023-10-09 ENCOUNTER — Encounter: Payer: Self-pay | Admitting: Family Medicine

## 2023-10-09 ENCOUNTER — Ambulatory Visit: Payer: Medicare Other | Admitting: Family Medicine

## 2023-10-15 ENCOUNTER — Other Ambulatory Visit: Payer: Self-pay | Admitting: Family Medicine

## 2023-10-15 DIAGNOSIS — Z7985 Long-term (current) use of injectable non-insulin antidiabetic drugs: Secondary | ICD-10-CM

## 2023-10-15 DIAGNOSIS — E1165 Type 2 diabetes mellitus with hyperglycemia: Secondary | ICD-10-CM

## 2023-10-16 ENCOUNTER — Ambulatory Visit (INDEPENDENT_AMBULATORY_CARE_PROVIDER_SITE_OTHER): Payer: Medicare Other | Admitting: Orthopaedic Surgery

## 2023-10-16 ENCOUNTER — Encounter: Payer: Self-pay | Admitting: Orthopaedic Surgery

## 2023-10-16 DIAGNOSIS — G8929 Other chronic pain: Secondary | ICD-10-CM

## 2023-10-16 DIAGNOSIS — M25561 Pain in right knee: Secondary | ICD-10-CM | POA: Diagnosis not present

## 2023-10-16 DIAGNOSIS — M25562 Pain in left knee: Secondary | ICD-10-CM

## 2023-10-16 MED ORDER — OXYCODONE-ACETAMINOPHEN 7.5-325 MG PO TABS: ORAL_TABLET | ORAL | 0 refills | Status: DC

## 2023-10-16 MED ORDER — METHYLPREDNISOLONE ACETATE 40 MG/ML IJ SUSP
40.0000 mg | Freq: Once | INTRAMUSCULAR | Status: AC
Start: 2023-10-16 — End: 2023-10-16
  Administered 2023-10-16: 40 mg via INTRA_ARTICULAR

## 2023-10-16 NOTE — Addendum Note (Signed)
Addended by: Michaele Offer on: 10/16/2023 10:02 AM   Modules accepted: Orders

## 2023-10-16 NOTE — Progress Notes (Signed)
PROCEDURE NOTE:  The patient requests injections of the left knee , verbal consent was obtained.  The left knee was prepped appropriately after time out was performed.   Sterile technique was observed and injection of 1 cc of DepoMedrol 40 mg with several cc's of plain xylocaine. Anesthesia was provided by ethyl chloride and a 20-gauge needle was used to inject the knee area. The injection was tolerated well.  A band aid dressing was applied.  The patient was advised to apply ice later today and tomorrow to the injection sight as needed.  PROCEDURE NOTE:  The patient requests injections of the right knee , verbal consent was obtained.  The right knee was prepped appropriately after time out was performed.   Sterile technique was observed and injection of 1 cc of DepoMedrol 40mg  with several cc's of plain xylocaine. Anesthesia was provided by ethyl chloride and a 20-gauge needle was used to inject the knee area. The injection was tolerated well.  A band aid dressing was applied.  The patient was advised to apply ice later today and tomorrow to the injection sight as needed.  Encounter Diagnosis  Name Primary?   Chronic pain of both knees Yes   Return in one month.  I have reviewed the [place] Controlled Substance Reporting System web site prior to prescribing narcotic medicine for this patient.  Call if any problem.  Precautions discussed.  Electronically Signed Darreld Mclean, MD 10/29/20249:03 AM

## 2023-10-17 ENCOUNTER — Other Ambulatory Visit: Payer: Self-pay | Admitting: *Deleted

## 2023-10-17 NOTE — Patient Outreach (Signed)
Care Management   Visit Note  10/17/2023 Name: Mark Walls MRN: [id number] DOB: [birth year]  Subjective: Mark Walls is a 67 y.o. year old male who is a primary care patient of Mark Walls, Mark Campi, FNP. The Care Management team was consulted for assistance.      Engaged with patient spoke with patient by telephone for follow up   Goals Addressed             This Visit's Progress    COMPLETED: CCM (COPD) EXPECTED OUTCOME: MONITOR, SELF-MANAGE AND REDUCE SYMPTOMS OF COPD       Current Barriers:  Knowledge Deficits related to COPD management Care Coordination needs related to medication management in a patient with COPD Chronic Disease Management support and education needs related to COPD, action plan No Advanced Directives in place- pt declines Patient reports he lives alone but has male friend that stays at his house most of time, pt reports he is overall independent with ADL, IADL's, uses a cane as needed Patient reports several referrals were placed at 03/06/23 primary care provider appointment- Referrals as follows: podiatry- pt states he has this appointment in place,  ophthalmology- pt states he has not heard and wants information so he can schedule diabetic eye checkup,  diabetes and nutrition education- pt states he is not interested in this,  sleep study- pt states he had study in the past and not interested in following up any further, states "didn't like the machine" Update- patient states he decided not to do the eye exam 10/17/23- update- pt reports he is doing well, no new concerns reported  Planned Interventions: Provided patient with basic written and verbal COPD education on self care/management/and exacerbation prevention Advised patient to track and manage COPD triggers Provided instruction about proper use of medications used for management of COPD including inhalers Advised patient to self assesses COPD action plan zone and make appointment with  provider if in the yellow zone for 48 hours without improvement Provided education about and advised patient to utilize infection prevention strategies to reduce risk of respiratory infection Discussed the importance of adequate rest and management of fatigue with COPD RN care manager previously gave information for ophthalmologist [facility] in [place] at [phone] and encouraged pt to schedule eye exam, encouraged pt to stay up to date on eye exam and scheduled an appointment Reviewed plan of care with pt including case closure  Symptom Management: Take medications as prescribed   Attend all scheduled provider appointments Call pharmacy for medication refills 3-7 days in advance of running out of medications Attend church or other social activities Perform all self care activities independently  Perform IADL's (shopping, preparing meals, housekeeping, managing finances) independently Call provider office for new concerns or questions  identify and remove indoor air pollutants listen for public air quality announcements every day do breathing exercises every day develop a rescue plan follow rescue plan if symptoms flare-up get at least 7 to 8 hours of sleep at night practice relaxation or meditation daily Follow COPD action plan Call [facility] in [place] at [phone] Case closure         COMPLETED: CCM (DIABETES) EXPECTED OUTCOME: MONITOR, SELF-MANAGE AND REDUCE SYMPTOMS OF DIABETES       Current Barriers:  Knowledge Deficits related to Diabetes management Chronic Disease Management support and education needs related to Diabetes and diet No Advanced Directives in place- pt declined Patient reports he checks CBG once daily fasting with readings " good", AIC 7.6  on 08/15/23, pt not interested in Diabetes Education/ nutrition classes PharmD has been working with pt No new concerns reported  Planned Interventions: Reviewed medications with patient and discussed  importance of medication adherence;        Counseled on importance of regular laboratory monitoring as prescribed;        Provided patient with written educational materials related to hypo and hyperglycemia and importance of correct treatment;       Advised patient, providing education and rationale, to check cbg once daily and record        call provider for findings outside established parameters;       Review of patient status, including review of consultants reports, relevant laboratory and other test results, and medications completed;       Reviewed all upcoming scheduled appointments Reviewed importance of adherence to carbohydrate modified diet/ plate method  Symptom Management: Take medications as prescribed   Attend all scheduled provider appointments Call pharmacy for medication refills 3-7 days in advance of running out of medications Attend church or other social activities Perform all self care activities independently  Perform IADL's (shopping, preparing meals, housekeeping, managing finances) independently Call provider office for new concerns or questions  check blood sugar at prescribed times: once daily check feet daily for cuts, sores or redness enter blood sugar readings and medication or insulin into daily log take the blood sugar log to all doctor visits take the blood sugar meter to all doctor visits trim toenails straight across fill half of plate with vegetables limit fast food meals to no more than 1 per week manage portion size prepare main meal at home 3 to 5 days each week read food labels for fat, fiber, carbohydrates and portion size set a realistic goal keep feet up while sitting Please scheduled eye exam Case closure            Plan: No further follow up required: case closure  Irving Shows [facility], BSN [place]/ Ambulatory Care Management [phone]

## 2023-10-17 NOTE — Patient Instructions (Signed)
Visit Information  Thank you for taking time to visit with me today. Please don't hesitate to contact me if I can be of assistance to you before our next scheduled telephone appointment.  Following are the goals we discussed today:   Goals Addressed             This Visit's Progress    COMPLETED: CCM (COPD) EXPECTED OUTCOME: MONITOR, SELF-MANAGE AND REDUCE SYMPTOMS OF COPD       Current Barriers:  Knowledge Deficits related to COPD management Care Coordination needs related to medication management in a patient with COPD Chronic Disease Management support and education needs related to COPD, action plan No Advanced Directives in place- pt declines Patient reports he lives alone but has male friend that stays at his house most of time, pt reports he is overall independent with ADL, IADL's, uses a cane as needed Patient reports several referrals were placed at 03/06/23 primary care provider appointment- Referrals as follows: podiatry- pt states he has this appointment in place,  ophthalmology- pt states he has not heard and wants information so he can schedule diabetic eye checkup,  diabetes and nutrition education- pt states he is not interested in this,  sleep study- pt states he had study in the past and not interested in following up any further, states "didn't like the machine" Update- patient states he decided not to do the eye exam 10/17/23- update- pt reports he is doing well, no new concerns reported  Planned Interventions: Provided patient with basic written and verbal COPD education on self care/management/and exacerbation prevention Advised patient to track and manage COPD triggers Provided instruction about proper use of medications used for management of COPD including inhalers Advised patient to self assesses COPD action plan zone and make appointment with provider if in the yellow zone for 48 hours without improvement Provided education about and advised patient to utilize  infection prevention strategies to reduce risk of respiratory infection Discussed the importance of adequate rest and management of fatigue with COPD RN care manager previously gave information for ophthalmologist [facility] in [place] at [phone] and encouraged pt to schedule eye exam, encouraged pt to stay up to date on eye exam and scheduled an appointment Reviewed plan of care with pt including case closure  Symptom Management: Take medications as prescribed   Attend all scheduled provider appointments Call pharmacy for medication refills 3-7 days in advance of running out of medications Attend church or other social activities Perform all self care activities independently  Perform IADL's (shopping, preparing meals, housekeeping, managing finances) independently Call provider office for new concerns or questions  identify and remove indoor air pollutants listen for public air quality announcements every day do breathing exercises every day develop a rescue plan follow rescue plan if symptoms flare-up get at least 7 to 8 hours of sleep at night practice relaxation or meditation daily Follow COPD action plan Call [facility] in [place] at [phone] Case closure         COMPLETED: CCM (DIABETES) EXPECTED OUTCOME: MONITOR, SELF-MANAGE AND REDUCE SYMPTOMS OF DIABETES       Current Barriers:  Knowledge Deficits related to Diabetes management Chronic Disease Management support and education needs related to Diabetes and diet No Advanced Directives in place- pt declined Patient reports he checks CBG once daily fasting with readings " good", AIC 7.6 on 08/15/23, pt not interested in Diabetes Education/ nutrition classes PharmD has been working with pt No new concerns reported  Planned  Interventions: Reviewed medications with patient and discussed importance of medication adherence;        Counseled on importance of regular laboratory monitoring as prescribed;         Provided patient with written educational materials related to hypo and hyperglycemia and importance of correct treatment;       Advised patient, providing education and rationale, to check cbg once daily and record        call provider for findings outside established parameters;       Review of patient status, including review of consultants reports, relevant laboratory and other test results, and medications completed;       Reviewed all upcoming scheduled appointments Reviewed importance of adherence to carbohydrate modified diet/ plate method  Symptom Management: Take medications as prescribed   Attend all scheduled provider appointments Call pharmacy for medication refills 3-7 days in advance of running out of medications Attend church or other social activities Perform all self care activities independently  Perform IADL's (shopping, preparing meals, housekeeping, managing finances) independently Call provider office for new concerns or questions  check blood sugar at prescribed times: once daily check feet daily for cuts, sores or redness enter blood sugar readings and medication or insulin into daily log take the blood sugar log to all doctor visits take the blood sugar meter to all doctor visits trim toenails straight across fill half of plate with vegetables limit fast food meals to no more than 1 per week manage portion size prepare main meal at home 3 to 5 days each week read food labels for fat, fiber, carbohydrates and portion size set a realistic goal keep feet up while sitting Please scheduled eye exam Case closure            Please call the care guide team at [phone] if you need to cancel or reschedule your appointment.   If you are experiencing a Mental Health or Behavioral Health Crisis or need someone to talk to, please call the Suicide and Crisis Lifeline: 988 call the [place] National Suicide Prevention Lifeline: 980-529-6004 or TTY: (413)518-0046  TTY 308-302-4592) to talk to a trained counselor call 1-800-273-TALK (toll free, 24 hour hotline) go to [facility] [address] [place] [phone]) call the [facility] Line: [phone] call 911   The patient verbalized understanding of instructions, educational materials, and care plan provided today and DECLINED offer to receive copy of patient instructions, educational materials, and care plan.   No further follow up required: case closure  Irving Shows [facility], BSN [facility]/ Ambulatory Care Management [phone]

## 2023-10-18 ENCOUNTER — Ambulatory Visit: Payer: Medicare Other | Admitting: Family Medicine

## 2023-10-25 ENCOUNTER — Telehealth: Payer: Self-pay | Admitting: *Deleted

## 2023-10-25 DIAGNOSIS — E1165 Type 2 diabetes mellitus with hyperglycemia: Secondary | ICD-10-CM

## 2023-10-25 DIAGNOSIS — Z7985 Long-term (current) use of injectable non-insulin antidiabetic drugs: Secondary | ICD-10-CM

## 2023-10-25 MED ORDER — OZEMPIC (0.25 OR 0.5 MG/DOSE) 2 MG/1.5ML ~~LOC~~ SOPN
0.2500 mg | PEN_INJECTOR | SUBCUTANEOUS | 0 refills | Status: AC
Start: 2023-10-25 — End: ?

## 2023-10-25 NOTE — Telephone Encounter (Signed)
Patient aware.

## 2023-10-25 NOTE — Telephone Encounter (Signed)
Sent in refill of ozempic for one month supply for patient to establish with new PCP.

## 2023-11-14 ENCOUNTER — Ambulatory Visit (INDEPENDENT_AMBULATORY_CARE_PROVIDER_SITE_OTHER): Payer: Medicare Other | Admitting: Orthopaedic Surgery

## 2023-11-14 ENCOUNTER — Encounter: Payer: Self-pay | Admitting: Orthopaedic Surgery

## 2023-11-14 DIAGNOSIS — G8929 Other chronic pain: Secondary | ICD-10-CM

## 2023-11-14 DIAGNOSIS — M25561 Pain in right knee: Secondary | ICD-10-CM | POA: Diagnosis not present

## 2023-11-14 DIAGNOSIS — M25562 Pain in left knee: Secondary | ICD-10-CM

## 2023-11-14 MED ORDER — METHYLPREDNISOLONE ACETATE 40 MG/ML IJ SUSP
40.0000 mg | Freq: Once | INTRAMUSCULAR | Status: AC
Start: 2023-11-14 — End: 2023-11-14
  Administered 2023-11-14: 40 mg via INTRA_ARTICULAR

## 2023-11-14 NOTE — Progress Notes (Signed)
PROCEDURE NOTE:  The patient requests injections of the right knee , verbal consent was obtained.  The right knee was prepped appropriately after time out was performed.   Sterile technique was observed and injection of 1 cc of DepoMedrol 40mg  with several cc's of plain xylocaine. Anesthesia was provided by ethyl chloride and a 20-gauge needle was used to inject the knee area. The injection was tolerated well.  A band aid dressing was applied.  The patient was advised to apply ice later today and tomorrow to the injection sight as needed.  PROCEDURE NOTE:  The patient requests injections of the left knee , verbal consent was obtained.  The left knee was prepped appropriately after time out was performed.   Sterile technique was observed and injection of 1 cc of DepoMedrol 40 mg with several cc's of plain xylocaine. Anesthesia was provided by ethyl chloride and a 20-gauge needle was used to inject the knee area. The injection was tolerated well.  A band aid dressing was applied.  The patient was advised to apply ice later today and tomorrow to the injection sight as needed.  Encounter Diagnosis  Name Primary?   Chronic pain of both knees Yes   Return Jan. 8, 2025.  Call if any problem.  Precautions discussed.  Electronically Signed Darreld Mclean, MD 11/27/20249:16 AM

## 2023-11-14 NOTE — Addendum Note (Signed)
Addended by: Michaele Offer on: 11/14/2023 09:23 AM   Modules accepted: Orders

## 2023-11-17 ENCOUNTER — Other Ambulatory Visit: Payer: Self-pay | Admitting: Family Medicine

## 2023-11-17 DIAGNOSIS — E1169 Type 2 diabetes mellitus with other specified complication: Secondary | ICD-10-CM

## 2023-11-19 ENCOUNTER — Telehealth: Payer: Self-pay | Admitting: Orthopaedic Surgery

## 2023-11-19 MED ORDER — OXYCODONE-ACETAMINOPHEN 7.5-325 MG PO TABS: ORAL_TABLET | ORAL | 0 refills | Status: DC

## 2023-11-19 NOTE — Telephone Encounter (Signed)
Dr. Sanjuan Dame pt - [name] lvm, returned her call, she stated that this patient's meds was supposed to be called in on his last visit, Oxycodone 7.5-325 to [facility] [place]

## 2023-12-01 ENCOUNTER — Other Ambulatory Visit: Payer: Self-pay | Admitting: Family Medicine

## 2023-12-01 DIAGNOSIS — Z794 Long term (current) use of insulin: Secondary | ICD-10-CM

## 2023-12-01 DIAGNOSIS — Z7985 Long-term (current) use of injectable non-insulin antidiabetic drugs: Secondary | ICD-10-CM

## 2023-12-13 ENCOUNTER — Telehealth: Payer: Self-pay

## 2023-12-13 MED ORDER — OXYCODONE-ACETAMINOPHEN 7.5-325 MG PO TABS: ORAL_TABLET | ORAL | 0 refills | Status: DC

## 2023-12-13 NOTE — Telephone Encounter (Signed)
Dr. Hilda Lias patient-- Oxycodone-Acetaminophen 7.5/325 MG  Qty 110 Tablets   One tablet by mouth every six hours as needed for pain.   PATIENT USES [facility]

## 2023-12-26 ENCOUNTER — Ambulatory Visit: Payer: Medicare Other | Admitting: Orthopaedic Surgery

## 2023-12-26 ENCOUNTER — Encounter: Payer: Self-pay | Admitting: Orthopaedic Surgery

## 2023-12-26 VITALS — BP 175/72 | HR 75 | Ht 67.0 in | Wt 193.4 lb

## 2023-12-26 DIAGNOSIS — M25562 Pain in left knee: Secondary | ICD-10-CM

## 2023-12-26 DIAGNOSIS — M25561 Pain in right knee: Secondary | ICD-10-CM

## 2023-12-26 DIAGNOSIS — G8929 Other chronic pain: Secondary | ICD-10-CM | POA: Diagnosis not present

## 2023-12-26 NOTE — Progress Notes (Signed)
 PROCEDURE NOTE:  The patient requests injections of the right knee , verbal consent was obtained.  The right knee was prepped appropriately after time out was performed.   Sterile technique was observed and injection of 1 cc of DepoMedrol 40mg  with several cc's of plain xylocaine . Anesthesia was provided by ethyl chloride and a 20-gauge needle was used to inject the knee area. The injection was tolerated well.  A band aid dressing was applied.  The patient was advised to apply ice later today and tomorrow to the injection sight as needed.  PROCEDURE NOTE:  The patient requests injections of the left knee , verbal consent was obtained.  The left knee was prepped appropriately after time out was performed.   Sterile technique was observed and injection of 1 cc of DepoMedrol 40 mg with several cc's of plain xylocaine . Anesthesia was provided by ethyl chloride and a 20-gauge needle was used to inject the knee area. The injection was tolerated well.  A band aid dressing was applied.  The patient was advised to apply ice later today and tomorrow to the injection sight as needed.  Encounter Diagnosis  Name Primary?   Chronic pain of both knees Yes   Return in six weeks.  Call if any problem.  Precautions discussed.  Electronically Signed Lemond Stable, MD 1/8/202510:12 AM

## 2024-01-14 ENCOUNTER — Telehealth: Payer: Self-pay | Admitting: Orthopaedic Surgery

## 2024-01-14 MED ORDER — OXYCODONE-ACETAMINOPHEN 7.5-325 MG PO TABS: ORAL_TABLET | ORAL | 0 refills | Status: DC

## 2024-01-14 NOTE — Telephone Encounter (Signed)
Dr. Sanjuan Dame pt - pt's spouse Velna Hatchet called and stated that the patient needs a refill on his Oxycodone 7.5-325 sent to [facility] in [place]

## 2024-02-06 ENCOUNTER — Ambulatory Visit: Payer: Medicare Other | Admitting: Orthopaedic Surgery

## 2024-02-12 ENCOUNTER — Telehealth: Payer: Self-pay | Admitting: Orthopaedic Surgery

## 2024-02-12 NOTE — Telephone Encounter (Signed)
 Dr. Sanjuan Dame pt - spoke w/the pt, he is requesting a refill on Oxycodone 7.5-325 to be sent to [facility] in [place]

## 2024-02-13 MED ORDER — OXYCODONE-ACETAMINOPHEN 7.5-325 MG PO TABS: ORAL_TABLET | ORAL | 0 refills | Status: DC

## 2024-02-14 ENCOUNTER — Ambulatory Visit: Payer: Medicare Other | Admitting: Orthopaedic Surgery

## 2024-02-20 ENCOUNTER — Ambulatory Visit: Payer: Medicare Other | Admitting: Orthopaedic Surgery

## 2024-02-27 ENCOUNTER — Ambulatory Visit (INDEPENDENT_AMBULATORY_CARE_PROVIDER_SITE_OTHER): Admitting: Orthopaedic Surgery

## 2024-02-27 ENCOUNTER — Encounter: Payer: Self-pay | Admitting: Orthopaedic Surgery

## 2024-02-27 DIAGNOSIS — G8929 Other chronic pain: Secondary | ICD-10-CM

## 2024-02-27 DIAGNOSIS — M25562 Pain in left knee: Secondary | ICD-10-CM

## 2024-02-27 NOTE — Progress Notes (Signed)
 PROCEDURE NOTE:  The patient requests injections of the left knee , verbal consent was obtained.  The left knee was prepped appropriately after time out was performed.   Sterile technique was observed and injection of 1 cc of DepoMedrol 40 mg with several cc's of plain xylocaine. Anesthesia was provided by ethyl chloride and a 20-gauge needle was used to inject the knee area. The injection was tolerated well.  A band aid dressing was applied.  The patient was advised to apply ice later today and tomorrow to the injection sight as needed.  Encounter Diagnosis  Name Primary?   Chronic pain of left knee Yes   Return in six weeks.  Call if any problem.  Precautions discussed.  Electronically Signed Darreld Mclean, MD 3/12/20259:38 AM

## 2024-03-17 ENCOUNTER — Other Ambulatory Visit: Payer: Self-pay | Admitting: Orthopaedic Surgery

## 2024-03-17 NOTE — Telephone Encounter (Signed)
 DR. Hilda Lias   Patient came in the office to call in his pain medicine   oxyCODONE-acetaminophen (PERCOCET) 7.5-325 MG tablet   Pharmacy:  [facility] in [place]

## 2024-03-19 ENCOUNTER — Encounter: Payer: Self-pay | Admitting: Orthopedic Surgery

## 2024-03-19 MED ORDER — OXYCODONE-ACETAMINOPHEN 7.5-325 MG PO TABS: ORAL_TABLET | ORAL | 0 refills | Status: DC

## 2024-03-19 NOTE — Progress Notes (Signed)
 This patient is on 110 opioid medications per prescription.  I am filling in for Dr. Hilda Lias.  I will refill the medication based on the chronicity of the history related to these refills.

## 2024-03-25 ENCOUNTER — Encounter: Payer: Self-pay | Admitting: *Deleted

## 2024-04-09 ENCOUNTER — Ambulatory Visit: Admitting: Orthopaedic Surgery

## 2024-04-09 ENCOUNTER — Encounter: Payer: Self-pay | Admitting: Orthopaedic Surgery

## 2024-04-09 DIAGNOSIS — G8929 Other chronic pain: Secondary | ICD-10-CM

## 2024-04-09 DIAGNOSIS — M25562 Pain in left knee: Secondary | ICD-10-CM

## 2024-04-09 MED ORDER — METHYLPREDNISOLONE ACETATE 40 MG/ML IJ SUSP
40.0000 mg | Freq: Once | INTRAMUSCULAR | Status: AC
  Administered 2024-04-09: 40 mg via INTRA_ARTICULAR

## 2024-04-09 MED ORDER — OXYCODONE-ACETAMINOPHEN 7.5-325 MG PO TABS: ORAL_TABLET | ORAL | 0 refills | Status: DC

## 2024-04-09 NOTE — Progress Notes (Signed)
 PROCEDURE NOTE:  The patient requests injections of the left knee , verbal consent was obtained.  The left knee was prepped appropriately after time out was performed.   Sterile technique was observed and injection of 1 cc of DepoMedrol 40 mg with several cc's of plain xylocaine. Anesthesia was provided by ethyl chloride and a 20-gauge needle was used to inject the knee area. The injection was tolerated well.  A band aid dressing was applied.  The patient was advised to apply ice later today and tomorrow to the injection sight as needed.  Encounter Diagnosis  Name Primary?   Chronic pain of left knee Yes   I have reviewed the [place]  Controlled Substance Reporting System web site prior to prescribing narcotic medicine for this patient.  Return in six weeks.  Call if any problem.  Precautions discussed.  Electronically Signed Pleasant Brilliant, MD 4/23/20259:36 AM

## 2024-04-15 ENCOUNTER — Telehealth: Payer: Self-pay | Admitting: Orthopaedic Surgery

## 2024-04-15 NOTE — Telephone Encounter (Signed)
 Dr. Vicente Graham pt - spoke w/the pt's spouse Louanna Rouse, she stated that the pt didn't get his meds last week and it's due today.  I look and told her it was sent to [facility].  She started fussing and questioning why it was sent there, she wants this pharmacy taken off his chart and then she hung up on me.  Not sure what pharmacy she wants listed.

## 2024-04-15 NOTE — Telephone Encounter (Signed)
 Dr. Vicente Graham pt - spouse called back and lvm that [facility] in [place] will be fine to list as the patient's pharmacy.

## 2024-04-15 NOTE — Telephone Encounter (Signed)
 Pharmacy changed

## 2024-04-16 ENCOUNTER — Other Ambulatory Visit: Payer: Self-pay | Admitting: Orthopaedic Surgery

## 2024-04-16 NOTE — Telephone Encounter (Signed)
 Dr. Vicente Graham pt - spoke w/the pt's spouse, she is requesting a refill for Oxycodone  7.5-.325 for this pt.

## 2024-04-28 ENCOUNTER — Telehealth: Payer: Self-pay | Admitting: Orthopaedic Surgery

## 2024-04-28 NOTE — Telephone Encounter (Signed)
 Dr. Vicente Graham pt - spoke w/Mark Walls, pt is requesting a refill for Oxycodone  7.5-325 to be sent to [facility] in [place].. do not use [facility] which is what is listed on his chart.

## 2024-04-30 ENCOUNTER — Other Ambulatory Visit: Payer: Self-pay | Admitting: Orthopaedic Surgery

## 2024-04-30 NOTE — Telephone Encounter (Signed)
 Dr. Vicente Graham office - spoke w/[name], she is requesting a refill for the pt for Oxycodone  7.5-325 to be sent to [facility] in [place].

## 2024-05-01 ENCOUNTER — Encounter: Payer: Self-pay | Admitting: Orthopaedic Surgery

## 2024-05-01 ENCOUNTER — Ambulatory Visit: Admitting: Orthopaedic Surgery

## 2024-05-01 DIAGNOSIS — G8929 Other chronic pain: Secondary | ICD-10-CM | POA: Diagnosis not present

## 2024-05-01 DIAGNOSIS — M25562 Pain in left knee: Secondary | ICD-10-CM

## 2024-05-01 MED ORDER — OXYCODONE-ACETAMINOPHEN 7.5-325 MG PO TABS: ORAL_TABLET | ORAL | 0 refills | Status: DC

## 2024-05-01 NOTE — Addendum Note (Signed)
 Addended by: Milinda Allen on: 05/01/2024 09:59 AM   Modules accepted: Orders

## 2024-05-01 NOTE — Progress Notes (Addendum)
 PROCEDURE NOTE:  The patient requests injections of the left knee , verbal consent was obtained.  The left knee was prepped appropriately after time out was performed.   Sterile technique was observed and injection of 1 cc of DepoMedrol 40 mg with several cc's of plain xylocaine. Anesthesia was provided by ethyl chloride and a 20-gauge needle was used to inject the knee area. The injection was tolerated well.  A band aid dressing was applied.  The patient was advised to apply ice later today and tomorrow to the injection sight as needed.  Encounter Diagnosis  Name Primary?   Chronic pain of left knee Yes   Return in six weeks.  He has asked for refill of medicine but it is too early.  Call if any problem.  Precautions discussed.  Electronically Signed Pleasant Brilliant, MD 5/15/20259:09 AM  He had pharmacy call me and then can hold Rx until due on May 30th.  I have done this.  Electronically Signed Pleasant Brilliant, MD 5/15/20259:59 AM

## 2024-05-21 ENCOUNTER — Ambulatory Visit: Admitting: Orthopaedic Surgery

## 2024-05-29 ENCOUNTER — Ambulatory Visit: Admitting: Orthopaedic Surgery

## 2024-06-05 ENCOUNTER — Ambulatory Visit: Admitting: Orthopaedic Surgery

## 2024-06-05 ENCOUNTER — Encounter: Payer: Self-pay | Admitting: Orthopaedic Surgery

## 2024-06-05 DIAGNOSIS — M25562 Pain in left knee: Secondary | ICD-10-CM

## 2024-06-05 DIAGNOSIS — G8929 Other chronic pain: Secondary | ICD-10-CM | POA: Diagnosis not present

## 2024-06-05 MED ORDER — OXYCODONE-ACETAMINOPHEN 7.5-325 MG PO TABS: ORAL_TABLET | ORAL | 0 refills | Status: DC

## 2024-06-05 NOTE — Progress Notes (Signed)
 PROCEDURE NOTE:  The patient requests injections of the left knee , verbal consent was obtained.  The left knee was prepped appropriately after time out was performed.   Sterile technique was observed and injection of 1 cc of DepoMedrol 40 mg with several cc's of plain xylocaine. Anesthesia was provided by ethyl chloride and a 20-gauge needle was used to inject the knee area. The injection was tolerated well.  A band aid dressing was applied.  The patient was advised to apply ice later today and tomorrow to the injection sight as needed.  Encounter Diagnosis  Name Primary?   Chronic pain of left knee Yes   Return in two months.  I have reviewed the [place]  Controlled Substance Reporting System web site prior to prescribing narcotic medicine for this patient.  Call if any problem.  Precautions discussed.  Electronically Signed Pleasant Brilliant, MD 6/19/20259:36 AM

## 2024-06-12 ENCOUNTER — Ambulatory Visit: Admitting: Orthopaedic Surgery

## 2024-07-10 ENCOUNTER — Telehealth: Payer: Self-pay | Admitting: *Deleted

## 2024-07-10 ENCOUNTER — Telehealth: Payer: Self-pay | Admitting: Orthopaedic Surgery

## 2024-07-10 NOTE — Telephone Encounter (Signed)
 Pt need a refill on his Percocet. Please send to [facility] in [place]

## 2024-07-10 NOTE — Telephone Encounter (Signed)
 Pt's wife called an update for refill of pain meds. Please call pt's wife about this matter at [phone].

## 2024-07-11 ENCOUNTER — Telehealth: Payer: Self-pay | Admitting: Orthopaedic Surgery

## 2024-07-11 NOTE — Telephone Encounter (Signed)
 Pt called for and update for refill of oxy. Pt's out Please send refill of oxy ASAP to pharmacy on file. Pt phone number is [phone].

## 2024-07-12 MED ORDER — OXYCODONE-ACETAMINOPHEN 7.5-325 MG PO TABS
ORAL_TABLET | ORAL | 0 refills | Status: DC
Start: 2024-07-12 — End: 2024-08-07

## 2024-07-12 NOTE — Addendum Note (Signed)
 Addended by: BRENNA NORLEEN ORN on: 07/12/2024 10:37 AM   Modules accepted: Orders

## 2024-07-14 NOTE — Telephone Encounter (Signed)
 The medication was refilled on 07/12/24

## 2024-07-14 NOTE — Telephone Encounter (Signed)
 See previous message

## 2024-08-07 ENCOUNTER — Encounter: Payer: Self-pay | Admitting: Orthopaedic Surgery

## 2024-08-07 ENCOUNTER — Ambulatory Visit: Admitting: Orthopaedic Surgery

## 2024-08-07 DIAGNOSIS — G8929 Other chronic pain: Secondary | ICD-10-CM

## 2024-08-07 DIAGNOSIS — M25562 Pain in left knee: Secondary | ICD-10-CM

## 2024-08-07 DIAGNOSIS — M25561 Pain in right knee: Secondary | ICD-10-CM | POA: Diagnosis not present

## 2024-08-07 MED ORDER — OXYCODONE-ACETAMINOPHEN 7.5-325 MG PO TABS: ORAL_TABLET | ORAL | 0 refills | Status: DC

## 2024-08-07 NOTE — Progress Notes (Signed)
 PROCEDURE NOTE:  The patient requests injections of the right knee , verbal consent was obtained.  The right knee was prepped appropriately after time out was performed.   Sterile technique was observed and injection of 1 cc of DepoMedrol 40mg  with several cc's of plain xylocaine. Anesthesia was provided by ethyl chloride and a 20-gauge needle was used to inject the knee area. The injection was tolerated well.  A band aid dressing was applied.  The patient was advised to apply ice later today and tomorrow to the injection sight as needed.  PROCEDURE NOTE:  The patient requests injections of the left knee , verbal consent was obtained.  The left knee was prepped appropriately after time out was performed.   Sterile technique was observed and injection of 1 cc of DepoMedrol 40 mg with several cc's of plain xylocaine. Anesthesia was provided by ethyl chloride and a 20-gauge needle was used to inject the knee area. The injection was tolerated well.  A band aid dressing was applied.  The patient was advised to apply ice later today and tomorrow to the injection sight as needed.  Encounter Diagnosis  Name Primary?   Chronic pain of both knees Yes   I have reviewed the [place]  Controlled Substance Reporting System web site prior to prescribing narcotic medicine for this patient.  Return prn.  I have informed the patient I will be retiring from medical practice and from this office on September 18, 2024.  The patient has been offered continuing care with Dr. Margrette or Dr. Onesimo of this office.  The patient may choose another provider and the records will be forwarded after proper signature and notification.  Patient understands and agrees.  Call if any problem.  Precautions discussed.  Electronically Signed Lemond Stable, MD 8/21/20259:00 AM

## 2024-08-28 ENCOUNTER — Encounter: Payer: Self-pay | Admitting: Orthopaedic Surgery

## 2024-08-28 ENCOUNTER — Ambulatory Visit: Admitting: Orthopaedic Surgery

## 2024-08-28 DIAGNOSIS — G8929 Other chronic pain: Secondary | ICD-10-CM

## 2024-08-28 DIAGNOSIS — M25561 Pain in right knee: Secondary | ICD-10-CM | POA: Diagnosis not present

## 2024-08-28 MED ORDER — OXYCODONE-ACETAMINOPHEN 7.5-325 MG PO TABS: ORAL_TABLET | ORAL | 0 refills | Status: AC

## 2024-08-28 NOTE — Progress Notes (Signed)
 PROCEDURE NOTE:  The patient requests injections of the right knee , verbal consent was obtained.  The right knee was prepped appropriately after time out was performed.   Sterile technique was observed and injection of 1 cc of DepoMedrol 40mg  with several cc's of plain xylocaine. Anesthesia was provided by ethyl chloride and a 20-gauge needle was used to inject the knee area. The injection was tolerated well.  A band aid dressing was applied.  The patient was advised to apply ice later today and tomorrow to the injection sight as needed.  Encounter Diagnosis  Name Primary?   Chronic pain of right knee Yes   Return prn.  I have reviewed the [place]  Controlled Substance Reporting System web site prior to prescribing narcotic medicine for this patient.  I have informed the patient I will be retiring from medical practice and from this office on September 18, 2024.  The patient has been offered continuing care with Dr. Margrette or Dr. Onesimo of this office.  The patient may choose another provider and the records will be forwarded after proper signature and notification.  Patient understands and agrees.  Call if any problem.  Precautions discussed.  Electronically Signed Lemond Stable, MD 9/11/20259:02 AM

## 2024-09-26 ENCOUNTER — Ambulatory Visit: Admitting: Orthopedic Surgery

## 2024-10-01 ENCOUNTER — Ambulatory Visit: Admitting: Orthopedic Surgery

## 2024-10-01 DIAGNOSIS — G9341 Metabolic encephalopathy: Secondary | ICD-10-CM | POA: Insufficient documentation

## 2024-10-08 ENCOUNTER — Ambulatory Visit: Admitting: Orthopedic Surgery

## 2024-10-08 DIAGNOSIS — E119 Type 2 diabetes mellitus without complications: Secondary | ICD-10-CM | POA: Insufficient documentation

## 2024-10-17 ENCOUNTER — Telehealth: Payer: Self-pay | Admitting: Radiology

## 2024-10-17 NOTE — Telephone Encounter (Signed)
 Patient saw Dr Brenna in the past and needs to get refills of pain meds.  He has been to pain management before. Patient will not be seen w/ you until 10/28/24- would you be willing to refill one Rx for him and let us  send the pain management referral in for future?  Do you still need to see him first?  Thanks-

## 2024-10-21 ENCOUNTER — Other Ambulatory Visit: Payer: Self-pay

## 2024-10-21 DIAGNOSIS — G8929 Other chronic pain: Secondary | ICD-10-CM

## 2024-10-21 NOTE — Telephone Encounter (Signed)
 Need to put pain management referral in now rather than at his appointment

## 2024-10-23 NOTE — Telephone Encounter (Signed)
 I am willing to refill this prescription only 1 time after I meet him next Wednesday in order to avoid withdrawal symptoms and after that he will have to acquire his pain medication through pain management.

## 2024-10-24 NOTE — Telephone Encounter (Signed)
 I called and talked with patients wife at length.  Advised her per Luke's note. She stated during the phone call that patient had been without pain medication since approximately 10/01/24.  She stated that patient had an episode where they thought something was going on with his blood sugar-had chest pain, SOB, sweating etc.  She stated they took him to [facility] to be evaluated where he spent 3-4 days in the hospital. She stated that he had run out of pain medications around that time he was taken to the hospital and has not had any medication since that time. . Advised them to keep appointment as scheduled with Herlene, and could be further discussed at that time.

## 2024-10-28 ENCOUNTER — Ambulatory Visit: Admitting: Orthopedic Surgery

## 2024-10-29 ENCOUNTER — Ambulatory Visit (INDEPENDENT_AMBULATORY_CARE_PROVIDER_SITE_OTHER): Admitting: Surgical

## 2024-10-29 ENCOUNTER — Ambulatory Visit: Admitting: Orthopedic Surgery

## 2024-10-29 DIAGNOSIS — M170 Bilateral primary osteoarthritis of knee: Secondary | ICD-10-CM | POA: Diagnosis not present

## 2024-10-31 ENCOUNTER — Telehealth: Payer: Self-pay | Admitting: Orthopedic Surgery

## 2024-10-31 ENCOUNTER — Encounter: Payer: Self-pay | Admitting: Surgical

## 2024-10-31 MED ORDER — LIDOCAINE HCL 1 % IJ SOLN
5.0000 mL | INTRAMUSCULAR | Status: AC | PRN
  Administered 2024-10-29: 5 mL

## 2024-10-31 MED ORDER — BUPIVACAINE HCL 0.25 % IJ SOLN
4.0000 mL | INTRAMUSCULAR | Status: AC | PRN
  Administered 2024-10-29: 4 mL via INTRA_ARTICULAR

## 2024-10-31 MED ORDER — TRIAMCINOLONE ACETONIDE 40 MG/ML IJ SUSP
40.0000 mg | INTRAMUSCULAR | Status: AC | PRN
  Administered 2024-10-29: 40 mg via INTRA_ARTICULAR

## 2024-10-31 NOTE — Telephone Encounter (Signed)
 Called [name] back, lvm for her.  Her message stated that they keep getting a reminder call for a 2pm appt for Mark Walls and asked about pain meds.  I explained in my message that he doesn't have any appts w/us  and they he cancelled his last one with Dr. JAYSON and Mark Walls and that they will not prescribe any meds until established.

## 2024-10-31 NOTE — Progress Notes (Signed)
 Office Visit Note   Patient: Mark Walls           Date of Birth: 1956/05/01           MRN: [id number] Visit Date: 10/29/2024 Requested by: [facility] [address] [place],  [place] [address] PCP: [facility], [facility]  Subjective: Chief Complaint  Patient presents with   bilateral knee pain    HPI: Mark Walls is a 68 y.o. male who presents to the office reporting bilateral knee pain, left greater than right.  Patient has previously seen Dr. Brenna in the past.  Describes medial and lateral sided knee pain with history of prior surgery.  Is unsure what surgery was performed.  Uses a cane to assist with weightbearing.  Has increased pain at night.  Was previously receiving oxycodone  7.5 mg prescriptions from Dr. Brenna number 110/month and the last time that he filled this prescription was 9/18.  Since then he has been hospitalized with potential TIA versus CVA.  His current complaint today is just his knees bothering him.  Last knee injection was 08/07/2024.  Has had right knee injection in September..                ROS: All systems reviewed are negative as they relate to the chief complaint within the history of present illness.  Patient denies fevers or chills.  Assessment & Plan: Visit Diagnoses:  1. Primary osteoarthritis of both knees     Plan: Impression is 68 year old male with end-stage knee arthritis in both knees.  We discussed options available to patient such as continuing with occasional cortisone injections versus trying gel injections versus knee rehab program versus knee replacement surgery.  He is on chronic pain medication and has been referred to [facility] medical pain management; I do not think it is my role to refill his chronic pain medication that he was receiving from his prior orthopedic provider, especially as he is currently off of his pain medication and not at risk for withdrawal symptoms.  After discussion,  patient would like to have left knee cortisone injection today.  Will preapproved bilateral knee gel injections for him and we can try these since he has never tried these before.  Did counsel him that if he is considering knee replacement in the future, it would benefit him to consider decreasing or discontinuing the chronic opioid medication that he is taking or else pain control after surgery would be very difficult.  Follow-up after gel injection approval.  This patient is diagnosed with osteoarthritis of the knee(s).    Radiographs show evidence of joint space narrowing, osteophytes, subchondral sclerosis and/or subchondral cysts.  This patient has knee pain which interferes with functional and activities of daily living.    This patient has experienced inadequate response, adverse effects and/or intolerance with conservative treatments such as acetaminophen , NSAIDS, topical creams, physical therapy or regular exercise, knee bracing and/or weight loss.   This patient has experienced inadequate response or has a contraindication to intra articular steroid injections for at least 3 months.   This patient is not scheduled to have a total knee replacement within 6 months of starting treatment with viscosupplementation.   Follow-Up Instructions: No follow-ups on file.   Orders:  Orders Placed This Encounter  Procedures   Ambulatory request for injection medication   No orders of the defined types were placed in this encounter.     Procedures: Large Joint Inj: L knee on 10/29/2024  9:02 AM Indications: diagnostic evaluation, joint swelling and pain Details: 18 G 1.5 in needle, superolateral approach  Arthrogram: No  Medications: 5 mL lidocaine 1 %; 4 mL bupivacaine 0.25 %; 40 mg triamcinolone acetonide 40 MG/ML Outcome: tolerated well, no immediate complications Procedure, treatment alternatives, risks and benefits explained, specific risks discussed. Consent was given by the patient.  Immediately prior to procedure a time out was called to verify the correct patient, procedure, equipment, support staff and site/side marked as required. Patient was prepped and draped in the usual sterile fashion.       Clinical Data: No additional findings.  Objective: Vital Signs: There were no vitals taken for this visit.  Physical Exam:  Constitutional: Patient appears well-developed HEENT:  Head: Normocephalic Eyes:EOM are normal Neck: Normal range of motion Cardiovascular: Normal rate Pulmonary/chest: Effort normal Neurologic: Patient is alert Skin: Skin is warm Psychiatric: Patient has normal mood and affect  Ortho Exam: Ortho exam demonstrates both knees with flexion contractures of about 15 degrees.  Small effusion in the right knee and no effusion in the left knee.  Tender over the medial and lateral joint lines of both knees.  Able to perform straight leg raise without extensor lag comparable to his flexion contracture.  No pain with hip range of motion though he does have reduced hip mobility bilaterally.  Palpable DP pulse of bilateral lower extremities.  Has flexion to about 110 degrees in both knees.  Stable to anterior and posterior drawer sign bilaterally.  Specialty Comments:  No specialty comments available.  Imaging: No results found.   PMFS History: Patient Active Problem List   Diagnosis Date Noted   Type 2 diabetes mellitus without complications (HCC) 10/08/2024   Acute metabolic encephalopathy 10/01/2024   Type 2 diabetes mellitus with diabetic neuropathy, with long-term current use of insulin  (HCC) 01/31/2023   Light headed 01/21/2020   Depression, major, single episode, severe (HCC) 10/05/2019   Statin intolerance 11/18/2018   Cervical neck pain with evidence of disc disease 05/25/2017   Pain syndrome, chronic 12/28/2016   Atypical mole 12/28/2016   Allergic rhinitis 12/14/2015   History of digestive system disease 08/03/2015   Polypharmacy  03/30/2014   OSA (obstructive sleep apnea) 01/20/2014   Fatty liver 10/24/2013   GERD (gastroesophageal reflux disease) 08/11/2013   Radicular pain of lumbosacral region 07/07/2013   PAD (peripheral artery disease) (HCC) 09/03/2012   COPD (chronic obstructive pulmonary disease) (HCC) 08/11/2011   Chronic gouty arthropathy 02/08/2011   Loss of vision 09/21/2010   Memory loss 12/25/2008   Type 2 diabetes mellitus with hyperglycemia (HCC) 05/22/2008   Hyperlipidemia associated with type 2 diabetes mellitus (HCC) 05/22/2008   GAD (generalized anxiety disorder) 05/22/2008   ERECTILE DYSFUNCTION 05/22/2008   Hypertension associated with diabetes (HCC) 05/22/2008   UNSPECIFIED PERIPHERAL VASCULAR DISEASE 05/22/2008   Chronic fatigue 05/22/2008   Past Medical History:  Diagnosis Date   Anxiety    Chronic fatigue    Depression    Diabetes mellitus, type 2 (HCC)    ED (erectile dysfunction)    Hyperlipidemia    Hypertension    Nicotine addiction    Obesity    PVD (peripheral vascular disease)     Family History  Problem Relation Age of Onset   Cirrhosis Mother    Melanoma Mother    Mental illness Father        suicide    Mental illness Brother    Diabetes Brother    Hypertension Brother  Diabetes Brother    Hypertension Brother    Mental illness Brother    Melanoma Brother    Colon cancer Paternal Grandfather        age 42    Past Surgical History:  Procedure Laterality Date   CHOLECYSTECTOMY     COLONOSCOPY  10/01/2002   [name]:small internal hemorrhoids/single small sessile polyp in the rectum/ otherwise normal. Hyperplastic polyp.   COLONOSCOPY WITH ESOPHAGOGASTRODUODENOSCOPY (EGD) N/A 08/19/2013   MFM:Ejulonld EG junction. Hiatal hernia. Abnormal gastric mucosa s/p bx: Internal hemorrhoids;  Otherwise, normal ileocolonoscopy - Status post segmental biopsy   ESOPHAGOGASTRODUODENOSCOPY  09/24/2002   [name]:gastritis of the antrum and the bodt of the stomach otherwise  normal   left hand     left knee athroscopy  03/2008   polpectomy colon     right common iliac artery stenting     right elbow     ROTATOR CUFF REPAIR  09/01/08   Social History   Occupational History   Occupation: disabled   Tobacco Use   Smoking status: Former    Current packs/day: 0.00    Average packs/day: 1 pack/day for 43.2 years (43.2 ttl pk-yrs)    Types: Cigarettes    Start date: 09/16/1970    Quit date: 11/17/2013    Years since quitting: 10.9   Smokeless tobacco: Never  Substance and Sexual Activity   Alcohol use: No    Alcohol/week: 0.0 standard drinks of alcohol   Drug use: No   Sexual activity: Yes    Partners: Female

## 2024-11-03 ENCOUNTER — Ambulatory Visit: Admitting: Surgical

## 2024-11-19 ENCOUNTER — Ambulatory Visit: Admitting: Surgical
